# Patient Record
Sex: Female | Born: 1965 | Race: White | Hispanic: No | Marital: Married | Smoking: Former smoker
Health system: Southern US, Community
[De-identification: ages and names within clinical notes are randomized; demographics above are authoritative.]

## PROBLEM LIST (undated history)

## (undated) DIAGNOSIS — F419 Anxiety disorder, unspecified: Secondary | ICD-10-CM

## (undated) DIAGNOSIS — R7303 Prediabetes: Secondary | ICD-10-CM

## (undated) DIAGNOSIS — I10 Essential (primary) hypertension: Secondary | ICD-10-CM

## (undated) DIAGNOSIS — K5900 Constipation, unspecified: Secondary | ICD-10-CM

## (undated) DIAGNOSIS — K219 Gastro-esophageal reflux disease without esophagitis: Secondary | ICD-10-CM

## (undated) HISTORY — DX: Anxiety disorder, unspecified: F41.9

## (undated) HISTORY — DX: Constipation, unspecified: K59.00

---

## 1989-11-22 HISTORY — PX: FACIAL FRACTURE SURGERY: SHX1570

## 2018-04-25 DIAGNOSIS — F32A Depression, unspecified: Secondary | ICD-10-CM | POA: Insufficient documentation

## 2018-04-25 DIAGNOSIS — F329 Major depressive disorder, single episode, unspecified: Secondary | ICD-10-CM | POA: Insufficient documentation

## 2018-04-25 DIAGNOSIS — F331 Major depressive disorder, recurrent, moderate: Secondary | ICD-10-CM | POA: Insufficient documentation

## 2018-07-03 DIAGNOSIS — E559 Vitamin D deficiency, unspecified: Secondary | ICD-10-CM | POA: Insufficient documentation

## 2020-11-27 ENCOUNTER — Ambulatory Visit (INDEPENDENT_AMBULATORY_CARE_PROVIDER_SITE_OTHER): Payer: 59 | Admitting: Internal Medicine

## 2020-11-27 ENCOUNTER — Encounter: Payer: Self-pay | Admitting: Internal Medicine

## 2020-11-27 ENCOUNTER — Other Ambulatory Visit: Payer: Self-pay

## 2020-11-27 VITALS — BP 160/90 | HR 106 | Temp 98.8°F | Resp 18 | Ht 61.0 in | Wt 142.8 lb

## 2020-11-27 DIAGNOSIS — Z7689 Persons encountering health services in other specified circumstances: Secondary | ICD-10-CM

## 2020-11-27 DIAGNOSIS — F419 Anxiety disorder, unspecified: Secondary | ICD-10-CM | POA: Insufficient documentation

## 2020-11-27 DIAGNOSIS — K5909 Other constipation: Secondary | ICD-10-CM | POA: Diagnosis not present

## 2020-11-27 DIAGNOSIS — R195 Other fecal abnormalities: Secondary | ICD-10-CM | POA: Diagnosis not present

## 2020-11-27 DIAGNOSIS — I10 Essential (primary) hypertension: Secondary | ICD-10-CM

## 2020-11-27 DIAGNOSIS — E785 Hyperlipidemia, unspecified: Secondary | ICD-10-CM | POA: Insufficient documentation

## 2020-11-27 DIAGNOSIS — G609 Hereditary and idiopathic neuropathy, unspecified: Secondary | ICD-10-CM

## 2020-11-27 DIAGNOSIS — G629 Polyneuropathy, unspecified: Secondary | ICD-10-CM | POA: Insufficient documentation

## 2020-11-27 MED ORDER — BUPROPION HCL ER (SR) 200 MG PO TB12: 200.0000 mg | ORAL_TABLET | Freq: Two times a day (BID) | ORAL | 5 refills | Status: DC

## 2020-11-27 MED ORDER — DOCUSATE SODIUM 100 MG PO CAPS: 100.0000 mg | ORAL_CAPSULE | Freq: Two times a day (BID) | ORAL | 0 refills | Status: DC

## 2020-11-27 MED ORDER — GABAPENTIN 100 MG PO CAPS: 100.0000 mg | ORAL_CAPSULE | Freq: Two times a day (BID) | ORAL | 3 refills | Status: DC

## 2020-11-27 MED ORDER — LOSARTAN POTASSIUM 50 MG PO TABS: 50.0000 mg | ORAL_TABLET | Freq: Every day | ORAL | 0 refills | Status: DC

## 2020-11-27 NOTE — Patient Instructions (Addendum)
Please continue to take Wellbutrin. Please stop taking Diclofenac and take Tylenol as needed.  Please start taking Gabapentin as prescribed for neuropathic pain and numbness.  Please start taking Losartan as prescribed for hypertension.  Please start taking Vitamin B12 supplements, 1000 mcg once daily.  Thank you for choosing [facility]! We consider it our privilege to take care of you!

## 2020-11-28 ENCOUNTER — Encounter: Payer: Self-pay | Admitting: Internal Medicine

## 2020-11-28 DIAGNOSIS — I10 Essential (primary) hypertension: Secondary | ICD-10-CM | POA: Insufficient documentation

## 2020-11-28 LAB — TSH: TSH: 1.48 u[IU]/mL (ref 0.450–4.500)

## 2020-11-28 LAB — CMP14+EGFR
ALT: 25 IU/L (ref 0–32)
AST: 20 IU/L (ref 0–40)
Albumin/Globulin Ratio: 1.5 (ref 1.2–2.2)
Albumin: 4.5 g/dL (ref 3.8–4.9)
Alkaline Phosphatase: 102 IU/L (ref 44–121)
BUN/Creatinine Ratio: 16 (ref 9–23)
BUN: 14 mg/dL (ref 6–24)
Bilirubin Total: <0.2 mg/dL (ref 0.0–1.2)
CO2: 19 mmol/L — ABNORMAL LOW (ref 20–29)
Calcium: 9.9 mg/dL (ref 8.7–10.2)
Chloride: 102 mmol/L (ref 96–106)
Creatinine, Ser: 0.89 mg/dL (ref 0.57–1.00)
GFR calc Af Amer: 85 mL/min/{1.73_m2} (ref >59)
GFR calc non Af Amer: 74 mL/min/{1.73_m2} (ref >59)
Globulin, Total: 3 g/dL (ref 1.5–4.5)
Glucose: 99 mg/dL (ref 65–99)
Potassium: 4.6 mmol/L (ref 3.5–5.2)
Sodium: 140 mmol/L (ref 134–144)
Total Protein: 7.5 g/dL (ref 6.0–8.5)

## 2020-11-28 LAB — CBC WITH DIFFERENTIAL/PLATELET
Basophils Absolute: 0.1 10*3/uL (ref 0.0–0.2)
Basos: 1 %
EOS (ABSOLUTE): 0.2 10*3/uL (ref 0.0–0.4)
Eos: 1 %
Hematocrit: 39.2 % (ref 34.0–46.6)
Hemoglobin: 13 g/dL (ref 11.1–15.9)
Immature Grans (Abs): 0 10*3/uL (ref 0.0–0.1)
Immature Granulocytes: 0 %
Lymphocytes Absolute: 3.2 10*3/uL — ABNORMAL HIGH (ref 0.7–3.1)
Lymphs: 23 %
MCH: 30.5 pg (ref 26.6–33.0)
MCHC: 33.2 g/dL (ref 31.5–35.7)
MCV: 92 fL (ref 79–97)
Monocytes Absolute: 0.9 10*3/uL (ref 0.1–0.9)
Monocytes: 6 %
Neutrophils Absolute: 9.6 10*3/uL — ABNORMAL HIGH (ref 1.4–7.0)
Neutrophils: 69 %
Platelets: 345 10*3/uL (ref 150–450)
RBC: 4.26 x10E6/uL (ref 3.77–5.28)
RDW: 12 % (ref 11.7–15.4)
WBC: 13.9 10*3/uL — ABNORMAL HIGH (ref 3.4–10.8)

## 2020-11-28 LAB — LIPID PANEL
Chol/HDL Ratio: 3.4 ratio (ref 0.0–4.4)
Cholesterol, Total: 198 mg/dL (ref 100–199)
HDL: 59 mg/dL (ref >39)
LDL Chol Calc (NIH): 112 mg/dL — ABNORMAL HIGH (ref 0–99)
Triglycerides: 154 mg/dL — ABNORMAL HIGH (ref 0–149)
VLDL Cholesterol Cal: 27 mg/dL (ref 5–40)

## 2020-11-28 LAB — HEMOGLOBIN A1C
Est. average glucose Bld gHb Est-mCnc: 120 mg/dL
Hgb A1c MFr Bld: 5.8 % — ABNORMAL HIGH (ref 4.8–5.6)

## 2020-11-28 LAB — VITAMIN D 25 HYDROXY (VIT D DEFICIENCY, FRACTURES): Vit D, 25-Hydroxy: 40.9 ng/mL (ref 30.0–100.0)

## 2020-11-28 NOTE — Assessment & Plan Note (Signed)
Started Colace Advised to increase water intake Referred to GI for colonoscopy as she complains of intermittent hematochezia and mucus in stool

## 2020-11-28 NOTE — Assessment & Plan Note (Signed)
On Wellbutrin 200 mg twice daily Has occasional anxiety episodes, seems somewhat related to her GI symptoms. If persistent anxiety episodes, plan to add Vistaril as needed.

## 2020-11-28 NOTE — Assessment & Plan Note (Signed)
Care established Previous chart reviewed History and medications reviewed with the patient 

## 2020-11-28 NOTE — Assessment & Plan Note (Signed)
Pins-and-needles sensation in bilateral UE and LE Started gabapentin Advised to take vitamin B12 supplements.

## 2020-11-28 NOTE — Assessment & Plan Note (Signed)
BP Readings from Last 1 Encounters:  11/27/20 (!) 160/90   New-onset Started losartan 50 mg daily Counseled for compliance with the medications Advised DASH diet and moderate exercise/walking, at least 150 mins/week

## 2020-11-28 NOTE — Progress Notes (Addendum)
New Patient Office Visit  Subjective:  Patient ID: Alexandra Bright, female    DOB: 31-Jul-1966  Age: 55 y.o. MRN: [id number]  CC:  Chief Complaint  Patient presents with  . New Patient (Initial Visit)    New patient pt has stomach problems she is constipated all the time has a pain under butt cheek is she bends over it pulls hard sometimes has read mucous when she wipes is unsure if this is coming from being constipated also has fingernail hemorrhaging she is tired and fatigues and she gets stiff when sitting for a time period her hands and feet are going numb her heels are killing her when she stands up     HPI Alexandra Bright is a 55 year old female with PMH of anxiety who presents for establishing care.  Patient states that she has chronic constipation and uses magnesium containing laxatives at times. She has also noticed blood and mucus in the stool at times. She denies melena, hematemesis, abdominal pain, nausea or vomiting. Denies recent weight loss. She has not had any colonoscopy yet.  She also c/o numbness sensation intermittently in b/l UE and LE. She denies any wrist pain, ankle pain or back pain association. She denies any recent injury. Denies any focal weakness.  Her BP was 160/90 on repeat check in the office today. She has had high BP during previous medical evaluations. Denies any dizziness, chest pain, dyspnea or palpitations.  Last Mammography in 02/2020.  Last PAP smear in 02/2020.  Patient has had 2 doses of COVID vaccine.  Past Medical History:  Diagnosis Date  . Anxiety   . Constipation     History reviewed. No pertinent surgical history.  History reviewed. No pertinent family history.  Social History   Socioeconomic History  . Marital status: Married    Spouse name: Not on file  . Number of children: Not on file  . Years of education: Not on file  . Highest education level: Not on file  Occupational History  . Not on file  Tobacco Use  . Smoking status:  Former Research scientist (life sciences)  . Smokeless tobacco: Never Used  Substance and Sexual Activity  . Alcohol use: Never  . Drug use: Never  . Sexual activity: Not on file  Other Topics Concern  . Not on file  Social History Narrative  . Not on file   Social Determinants of Health   Financial Resource Strain: Not on file  Food Insecurity: Not on file  Transportation Needs: Not on file  Physical Activity: Not on file  Stress: Not on file  Social Connections: Not on file  Intimate Partner Violence: Not on file    ROS Review of Systems  Constitutional: Negative for chills and fever.  HENT: Negative for congestion, sinus pressure, sinus pain and sore throat.   Eyes: Negative for pain and discharge.  Respiratory: Negative for cough and shortness of breath.   Cardiovascular: Negative for chest pain and palpitations.  Gastrointestinal: Positive for blood in stool and constipation. Negative for abdominal pain, diarrhea, nausea and vomiting.  Endocrine: Negative for polydipsia and polyuria.  Genitourinary: Negative for dysuria and hematuria.  Musculoskeletal: Negative for neck pain and neck stiffness.  Skin: Negative for rash.  Neurological: Positive for numbness. Negative for dizziness, seizures, syncope and weakness.  Psychiatric/Behavioral: Negative for agitation and behavioral problems.     Objective:   Today's Vitals: BP (!) 160/90 (BP Location: Right Arm, Patient Position: Sitting, Cuff Size: Normal)   Pulse (!) 106  Temp 98.8 F (37.1 C) (Oral)   Resp 18   Ht _0  (1.549 m)   Wt 142 lb 12.8 oz (64.8 kg)   SpO2 96%   BMI 26.98 kg/m   Physical Exam Vitals reviewed.  Constitutional:      General: She is not in acute distress.    Appearance: She is not diaphoretic.  HENT:     Head: Normocephalic and atraumatic.     Nose: Nose normal.     Mouth/Throat:     Mouth: Mucous membranes are moist.  Eyes:     General: No scleral icterus.    Extraocular Movements: Extraocular movements  intact.     Pupils: Pupils are equal, round, and reactive to light.  Cardiovascular:     Rate and Rhythm: Normal rate and regular rhythm.     Pulses: Normal pulses.     Heart sounds: Normal heart sounds. No murmur heard.   Pulmonary:     Breath sounds: Normal breath sounds. No wheezing or rales.  Abdominal:     Palpations: Abdomen is soft.     Tenderness: There is no abdominal tenderness. There is no guarding or rebound.  Musculoskeletal:     Cervical back: Neck supple. No tenderness.     Right lower leg: No edema.     Left lower leg: No edema.  Skin:    General: Skin is warm.     Findings: No rash.  Neurological:     General: No focal deficit present.     Mental Status: She is alert and oriented to person, place, and time.     Sensory: No sensory deficit.     Motor: No weakness.  Psychiatric:        Mood and Affect: Mood normal.        Behavior: Behavior normal.     Assessment & Plan:   Problem List Items Addressed This Visit      Encounter to establish care - Primary   Care established Previous chart reviewed History and medications reviewed with the patient     Relevant Orders  CBC with Differential (Completed)  CMP14+EGFR (Completed)  HgB A1c (Completed)  Lipid Profile (Completed)  TSH (Completed)  Vitamin D (25 hydroxy) (Completed)    Cardiovascular and Mediastinum   Hypertension    BP Readings from Last 1 Encounters:  11/27/20 (!) 160/90   New-onset Started losartan 50 mg daily Counseled for compliance with the medications Advised DASH diet and moderate exercise/walking, at least 150 mins/week       Relevant Medications   losartan (COZAAR) 50 MG tablet     Digestive   Chronic constipation    Started Colace Advised to increase water intake Referred to GI for colonoscopy as she complains of intermittent hematochezia and mucus in stool      Relevant Medications   docusate sodium (COLACE) 100 MG capsule   Other Relevant Orders   Ambulatory  referral to Gastroenterology     Nervous and Auditory   Idiopathic peripheral neuropathy    Pins-and-needles sensation in bilateral UE and LE Started gabapentin Advised to take vitamin B12 supplements.      Relevant Medications   buPROPion (WELLBUTRIN SR) 200 MG 12 hr tablet   gabapentin (NEURONTIN) 100 MG capsule     Other      Anxiety    On Wellbutrin 200 mg twice daily Has occasional anxiety episodes, seems somewhat related to her GI symptoms. If persistent anxiety episodes, plan to add Vistaril as  needed.      Relevant Medications   buPROPion (WELLBUTRIN SR) 200 MG 12 hr tablet   Mucus in stool    Intermittent episodes along with hematochezia Referred to GI for colonoscopy to rule out IBD      Relevant Orders   Ambulatory referral to Gastroenterology      Outpatient Encounter Medications as of 11/27/2020  Medication Sig  . docusate sodium (COLACE) 100 MG capsule Take 1 capsule (100 mg total) by mouth 2 (two) times daily.  Marland Kitchen gabapentin (NEURONTIN) 100 MG capsule Take 1 capsule (100 mg total) by mouth 2 (two) times daily.  Marland Kitchen losartan (COZAAR) 50 MG tablet Take 1 tablet (50 mg total) by mouth daily.  . [DISCONTINUED] buPROPion (WELLBUTRIN SR) 200 MG 12 hr tablet Take 1 tablet by mouth 2 (two) times daily.  . [DISCONTINUED] diclofenac (VOLTAREN) 75 MG EC tablet TAKE 1 TABLET BY MOUTH TWICE DAILY AS NEEDED WITH  FOOD  . buPROPion (WELLBUTRIN SR) 200 MG 12 hr tablet Take 1 tablet (200 mg total) by mouth 2 (two) times daily.   No facility-administered encounter medications on file as of 11/27/2020.    Follow-up: Return in about 3 months (around 02/25/2021).   Lindell Spar, MD

## 2020-11-28 NOTE — Assessment & Plan Note (Signed)
Intermittent episodes along with hematochezia Referred to GI for colonoscopy to rule out IBD

## 2020-12-01 ENCOUNTER — Encounter (INDEPENDENT_AMBULATORY_CARE_PROVIDER_SITE_OTHER): Payer: Self-pay | Admitting: *Deleted

## 2020-12-16 ENCOUNTER — Telehealth: Payer: Self-pay

## 2020-12-16 NOTE — Telephone Encounter (Signed)
Patient calling wanting results of lab work she had done [phone]

## 2020-12-16 NOTE — Telephone Encounter (Signed)
Her HbA1C is 5.8, which is in prediabetes range. Her cholesterol is also borderline high. She needs to follow low carbohydrate and low cholesterol diet for now. Will recheck levels later in the next visit.

## 2020-12-16 NOTE — Telephone Encounter (Signed)
Please advise 

## 2020-12-17 NOTE — Telephone Encounter (Signed)
Pt notified of lab results with verbal understanding also given information on low carb low cholesterol diet with verbal understanding

## 2021-01-20 DIAGNOSIS — C189 Malignant neoplasm of colon, unspecified: Secondary | ICD-10-CM

## 2021-01-20 HISTORY — DX: Malignant neoplasm of colon, unspecified: C18.9

## 2021-02-05 ENCOUNTER — Ambulatory Visit (INDEPENDENT_AMBULATORY_CARE_PROVIDER_SITE_OTHER): Payer: 59 | Admitting: Gastroenterology

## 2021-02-05 ENCOUNTER — Telehealth (INDEPENDENT_AMBULATORY_CARE_PROVIDER_SITE_OTHER): Payer: Self-pay

## 2021-02-05 ENCOUNTER — Other Ambulatory Visit (INDEPENDENT_AMBULATORY_CARE_PROVIDER_SITE_OTHER): Payer: Self-pay

## 2021-02-05 ENCOUNTER — Encounter (INDEPENDENT_AMBULATORY_CARE_PROVIDER_SITE_OTHER): Payer: Self-pay | Admitting: Gastroenterology

## 2021-02-05 ENCOUNTER — Other Ambulatory Visit: Payer: Self-pay

## 2021-02-05 ENCOUNTER — Encounter (INDEPENDENT_AMBULATORY_CARE_PROVIDER_SITE_OTHER): Payer: Self-pay

## 2021-02-05 DIAGNOSIS — R194 Change in bowel habit: Secondary | ICD-10-CM | POA: Diagnosis not present

## 2021-02-05 DIAGNOSIS — R140 Abdominal distension (gaseous): Secondary | ICD-10-CM

## 2021-02-05 DIAGNOSIS — K589 Irritable bowel syndrome without diarrhea: Secondary | ICD-10-CM | POA: Insufficient documentation

## 2021-02-05 DIAGNOSIS — K625 Hemorrhage of anus and rectum: Secondary | ICD-10-CM | POA: Insufficient documentation

## 2021-02-05 DIAGNOSIS — Z1211 Encounter for screening for malignant neoplasm of colon: Secondary | ICD-10-CM

## 2021-02-05 DIAGNOSIS — K582 Mixed irritable bowel syndrome: Secondary | ICD-10-CM | POA: Diagnosis not present

## 2021-02-05 MED ORDER — PEG 3350-KCL-NA BICARB-NACL 420 G PO SOLR: 4000.0000 mL | ORAL | 0 refills | Status: DC

## 2021-02-05 NOTE — H&P (View-Only) (Signed)
Alexandra Bright, M.D. Gastroenterology & Hepatology [facility] [address] [place], [place] [address] Primary Care Physician: Lindell Spar, MD [address] [place] [address]  Referring MD: PCP  Chief Complaint:  change in her bowel movements and blood in her stool  History of Present Illness: Alexandra Bright is a 55 y.o. female with PMH HTN, prediabetes and anxiety, who presents for evaluation of change in her bowel movements and blood in her stool.  Patient reports that she had a chronic history of constipation for multiple years. She used to have 2-3 BM per week. States she had a small bowel movement usually when she was able to move her bowels. Patient reports that after she received her second COVID shot she has had worsening of her symptoms as her BMs had a pellet size. She reports that when she was constipated she had significant abdominal pain and distention. She described having cramping in her lower abdomen. States the cramping improves with a BM. She states that since December  2021 she had new onset of multiple bowel movements per day, on average 4 to 12 BMs usually soft in consistency and a normal size. She states she was concerned as before she developed diarrhea she noticed that every week she had one BM with mucus and with some fresh blood. She also reports that she had to have a BM during the middle of the night twice which woke her up in the middle of the night. Overall feels the symptoms improve after having a BM. Also has noticed that her flatulence are very smelly.  She is not following any type of diet at the moment.  Has not tried any laxative or antidiarrheals in the past.  No recent travel in the last 6 months.  The patient denies having any nausea, vomiting, fever, chills, hematochezia, melena, hematemesis,  jaundice, pruritus. Gained 4 lb recently.  Last VEH:MCNOB Last Colonoscopy:never  FHx: neg for any  gastrointestinal/liver disease, cousins had cancer but does not know which type, aunt had lung cancer Social: neg smoking, alcohol or illicit drug use Surgical: no abdominal surgeries  Past Medical History: Past Medical History:  Diagnosis Date  . Anxiety   . Constipation     Past Surgical History:History reviewed. No pertinent surgical history.  Family History:History reviewed. No pertinent family history.  Social History: Social History   Tobacco Use  Smoking Status Former Smoker  Smokeless Tobacco Never Used   Social History   Substance and Sexual Activity  Alcohol Use Yes   Comment: occasionally   Social History   Substance and Sexual Activity  Drug Use Never    Allergies: Not on File  Medications: Current Outpatient Medications  Medication Sig Dispense Refill  . buPROPion (WELLBUTRIN SR) 200 MG 12 hr tablet Take 1 tablet (200 mg total) by mouth 2 (two) times daily. 60 tablet 5  . docusate sodium (COLACE) 100 MG capsule Take 1 capsule (100 mg total) by mouth 2 (two) times daily. 30 capsule 0  . gabapentin (NEURONTIN) 100 MG capsule Take 1 capsule (100 mg total) by mouth 2 (two) times daily. 60 capsule 3  . losartan (COZAAR) 50 MG tablet Take 1 tablet (50 mg total) by mouth daily. 90 tablet 0  . Multiple Vitamins-Minerals (MULTIVITAMIN WITH MINERALS) tablet Take 1 tablet by mouth daily.    Marland Kitchen OVER THE COUNTER MEDICATION Vit D with Calcium 20/600 mg daily.     No current facility-administered medications for  this visit.    Review of Systems: GENERAL: negative for malaise, night sweats HEENT: No changes in hearing or vision, no nose bleeds or other nasal problems. NECK: Negative for lumps, goiter, pain and significant neck swelling RESPIRATORY: Negative for cough, wheezing CARDIOVASCULAR: Negative for chest pain, leg swelling, palpitations, orthopnea GI: SEE HPI MUSCULOSKELETAL: Negative for joint pain or swelling, back pain, and muscle pain. SKIN: Negative  for lesions, rash PSYCH: Negative for sleep disturbance, mood disorder and recent psychosocial stressors. HEMATOLOGY Negative for prolonged bleeding, bruising easily, and swollen nodes. ENDOCRINE: Negative for cold or heat intolerance, polyuria, polydipsia and goiter. NEURO: negative for tremor, gait imbalance, syncope and seizures. The remainder of the review of systems is noncontributory.   Physical Exam: BP (!) 145/88 (BP Location: Left Arm, Patient Position: Sitting, Cuff Size: Large)   Pulse 100   Temp 98.8 F (37.1 C) (Oral)   Ht 5\' 1"  (1.549 m)   Wt 142 lb (64.4 kg)   BMI 26.83 kg/m  GENERAL: The patient is AO x3, in no acute distress. HEENT: Head is normocephalic and atraumatic. EOMI are intact. Mouth is well hydrated and without lesions. NECK: Supple. No masses LUNGS: Clear to auscultation. No presence of rhonchi/wheezing/rales. Adequate chest expansion HEART: RRR, normal s1 and s2. ABDOMEN: Soft, nontender, no guarding, no peritoneal signs, and nondistended. BS +. No masses. EXTREMITIES: Without any cyanosis, clubbing, rash, lesions or edema. NEUROLOGIC: AOx3, no focal motor deficit. SKIN: no jaundice, no rashes   Imaging/Labs: as above  I personally reviewed and interpreted the available labs, imaging and endoscopic files.  Impression and Plan: Alexandra Bright is a 55 y.o. female with PMH HTN, prediabetes and anxiety, who presents for evaluation of change in her bowel movements and blood in her stool.  She presented a chronic history of constipation which has recently switched to chronic diarrhea on a daily basis.  Also presented some abdominal pain that actually improves with bowel movements.  Even though her symptoms seem to suggest irritable bowel syndrome mixed type given the chronicity of her symptoms, she has recently presented rectal bleeding which will need to be further explored as she has never had a colonoscopy in the past and she is above age 67.  I discussed this  with the patient who agreed to proceed with a colonoscopy.  We will also check celiac serologies and ova and parasite at this time to evaluate her chronic diarrhea.  In the meantime, she can implement the use of a low FODMAP diet and take IBgard to improve her symptoms of bloating abdominal pain.  She understood and agreed.  - Schedule colonoscopy - Check celiac serologies and ova and parasite in stool - Explained presumed etiology of IBS symptoms. Patient was counseled about the benefit of implementing a low FODMAP to improve symptoms and recurrent episodes. A dietary list was provided to the patient. Also, the patient was counseled about the benefit of avoiding stressing situations and potential environmental triggers leading to symptomatology. - Start IBGard/peppermint oil 1 tablet every 8-12 hours as needed for bloating and abdominal pain - RTC 3 months  All questions were answered.      Alexandra Peppers, MD Gastroenterology and Hepatology [facility]

## 2021-02-05 NOTE — Progress Notes (Signed)
[name], M.D. Gastroenterology & Hepatology [facility] [address] [place], [place] [address] Primary Care Physician: Lindell Spar, MD [address] [place] [address]  Referring MD: PCP  Chief Complaint:  change in her bowel movements and blood in her stool  History of Present Illness: Alexandra Bright is a 55 y.o. female with PMH HTN, prediabetes and anxiety, who presents for evaluation of change in her bowel movements and blood in her stool.  Patient reports that she had a chronic history of constipation for multiple years. She used to have 2-3 BM per week. States she had a small bowel movement usually when she was able to move her bowels. Patient reports that after she received her second COVID shot she has had worsening of her symptoms as her BMs had a pellet size. She reports that when she was constipated she had significant abdominal pain and distention. She described having cramping in her lower abdomen. States the cramping improves with a BM. She states that since December  2021 she had new onset of multiple bowel movements per day, on average 4 to 12 BMs usually soft in consistency and a normal size. She states she was concerned as before she developed diarrhea she noticed that every week she had one BM with mucus and with some fresh blood. She also reports that she had to have a BM during the middle of the night twice which woke her up in the middle of the night. Overall feels the symptoms improve after having a BM. Also has noticed that her flatulence are very smelly.  She is not following any type of diet at the moment.  Has not tried any laxative or antidiarrheals in the past.  No recent travel in the last 6 months.  The patient denies having any nausea, vomiting, fever, chills, hematochezia, melena, hematemesis,  jaundice, pruritus. Gained 4 lb recently.  Last WFU:XNATF Last Colonoscopy:never  FHx: neg for any  gastrointestinal/liver disease, cousins had cancer but does not know which type, aunt had lung cancer Social: neg smoking, alcohol or illicit drug use Surgical: no abdominal surgeries  Past Medical History: Past Medical History:  Diagnosis Date  . Anxiety   . Constipation     Past Surgical History:History reviewed. No pertinent surgical history.  Family History:History reviewed. No pertinent family history.  Social History: Social History   Tobacco Use  Smoking Status Former Smoker  Smokeless Tobacco Never Used   Social History   Substance and Sexual Activity  Alcohol Use Yes   Comment: occasionally   Social History   Substance and Sexual Activity  Drug Use Never    Allergies: Not on File  Medications: Current Outpatient Medications  Medication Sig Dispense Refill  . buPROPion (WELLBUTRIN SR) 200 MG 12 hr tablet Take 1 tablet (200 mg total) by mouth 2 (two) times daily. 60 tablet 5  . docusate sodium (COLACE) 100 MG capsule Take 1 capsule (100 mg total) by mouth 2 (two) times daily. 30 capsule 0  . gabapentin (NEURONTIN) 100 MG capsule Take 1 capsule (100 mg total) by mouth 2 (two) times daily. 60 capsule 3  . losartan (COZAAR) 50 MG tablet Take 1 tablet (50 mg total) by mouth daily. 90 tablet 0  . Multiple Vitamins-Minerals (MULTIVITAMIN WITH MINERALS) tablet Take 1 tablet by mouth daily.    Marland Kitchen OVER THE COUNTER MEDICATION Vit D with Calcium 20/600 mg daily.     No current facility-administered medications for  this visit.    Review of Systems: GENERAL: negative for malaise, night sweats HEENT: No changes in hearing or vision, no nose bleeds or other nasal problems. NECK: Negative for lumps, goiter, pain and significant neck swelling RESPIRATORY: Negative for cough, wheezing CARDIOVASCULAR: Negative for chest pain, leg swelling, palpitations, orthopnea GI: SEE HPI MUSCULOSKELETAL: Negative for joint pain or swelling, back pain, and muscle pain. SKIN: Negative  for lesions, rash PSYCH: Negative for sleep disturbance, mood disorder and recent psychosocial stressors. HEMATOLOGY Negative for prolonged bleeding, bruising easily, and swollen nodes. ENDOCRINE: Negative for cold or heat intolerance, polyuria, polydipsia and goiter. NEURO: negative for tremor, gait imbalance, syncope and seizures. The remainder of the review of systems is noncontributory.   Physical Exam: BP (!) 145/88 (BP Location: Left Arm, Patient Position: Sitting, Cuff Size: Large)   Pulse 100   Temp 98.8 F (37.1 C) (Oral)   Ht 5\' 1"  (1.549 m)   Wt 142 lb (64.4 kg)   BMI 26.83 kg/m  GENERAL: The patient is AO x3, in no acute distress. HEENT: Head is normocephalic and atraumatic. EOMI are intact. Mouth is well hydrated and without lesions. NECK: Supple. No masses LUNGS: Clear to auscultation. No presence of rhonchi/wheezing/rales. Adequate chest expansion HEART: RRR, normal s1 and s2. ABDOMEN: Soft, nontender, no guarding, no peritoneal signs, and nondistended. BS +. No masses. EXTREMITIES: Without any cyanosis, clubbing, rash, lesions or edema. NEUROLOGIC: AOx3, no focal motor deficit. SKIN: no jaundice, no rashes   Imaging/Labs: as above  I personally reviewed and interpreted the available labs, imaging and endoscopic files.  Impression and Plan: Alexandra Bright is a 55 y.o. female with PMH HTN, prediabetes and anxiety, who presents for evaluation of change in her bowel movements and blood in her stool.  She presented a chronic history of constipation which has recently switched to chronic diarrhea on a daily basis.  Also presented some abdominal pain that actually improves with bowel movements.  Even though her symptoms seem to suggest irritable bowel syndrome mixed type given the chronicity of her symptoms, she has recently presented rectal bleeding which will need to be further explored as she has never had a colonoscopy in the past and she is above age 80.  I discussed this  with the patient who agreed to proceed with a colonoscopy.  We will also check celiac serologies and ova and parasite at this time to evaluate her chronic diarrhea.  In the meantime, she can implement the use of a low FODMAP diet and take IBgard to improve her symptoms of bloating abdominal pain.  She understood and agreed.  - Schedule colonoscopy - Check celiac serologies and ova and parasite in stool - Explained presumed etiology of IBS symptoms. Patient was counseled about the benefit of implementing a low FODMAP to improve symptoms and recurrent episodes. A dietary list was provided to the patient. Also, the patient was counseled about the benefit of avoiding stressing situations and potential environmental triggers leading to symptomatology. - Start IBGard/peppermint oil 1 tablet every 8-12 hours as needed for bloating and abdominal pain - RTC 3 months  All questions were answered.      [name], MD Gastroenterology and Hepatology [facility]

## 2021-02-05 NOTE — Patient Instructions (Signed)
Schedule colonoscopy Perform blood workup Explained presumed etiology of IBS symptoms. Patient was counseled about the benefit of implementing a low FODMAP to improve symptoms and recurrent episodes. A dietary list was provided to the patient. Also, the patient was counseled about the benefit of avoiding stressing situations and potential environmental triggers leading to symptomatology. Start IBGard/peppermint oil 1 tablet every 8-12 hours as needed for bloating and abdominal pain

## 2021-02-05 NOTE — Telephone Encounter (Signed)
Alexandra Bright, CMA  

## 2021-02-06 LAB — CBC WITH DIFFERENTIAL/PLATELET
Absolute Monocytes: 851 cells/uL (ref 200–950)
Basophils Absolute: 69 cells/uL (ref 0–200)
Basophils Relative: 0.6 %
Eosinophils Absolute: 138 cells/uL (ref 15–500)
Eosinophils Relative: 1.2 %
HCT: 41.9 % (ref 35.0–45.0)
Hemoglobin: 13.7 g/dL (ref 11.7–15.5)
Lymphs Abs: 2841 cells/uL (ref 850–3900)
MCH: 30.4 pg (ref 27.0–33.0)
MCHC: 32.7 g/dL (ref 32.0–36.0)
MCV: 92.9 fL (ref 80.0–100.0)
MPV: 11.2 fL (ref 7.5–12.5)
Monocytes Relative: 7.4 %
Neutro Abs: 7602 cells/uL (ref 1500–7800)
Neutrophils Relative %: 66.1 %
Platelets: 289 10*3/uL (ref 140–400)
RBC: 4.51 10*6/uL (ref 3.80–5.10)
RDW: 13.6 % (ref 11.0–15.0)
Total Lymphocyte: 24.7 %
WBC: 11.5 10*3/uL — ABNORMAL HIGH (ref 3.8–10.8)

## 2021-02-06 LAB — IGA: Immunoglobulin A: 278 mg/dL (ref 47–310)

## 2021-02-06 LAB — TISSUE TRANSGLUTAMINASE, IGA: (tTG) Ab, IgA: <1 U/mL

## 2021-02-12 LAB — OVA AND PARASITE EXAMINATION
CONCENTRATE RESULT:: NONE SEEN
MICRO NUMBER:: 11671840
SPECIMEN QUALITY:: ADEQUATE
TRICHROME RESULT:: NONE SEEN

## 2021-02-16 ENCOUNTER — Other Ambulatory Visit: Payer: Self-pay

## 2021-02-16 ENCOUNTER — Other Ambulatory Visit: Payer: Self-pay | Admitting: *Deleted

## 2021-02-16 ENCOUNTER — Other Ambulatory Visit (HOSPITAL_COMMUNITY)
Admission: RE | Admit: 2021-02-16 | Discharge: 2021-02-16 | Disposition: A | Discharge status: Home / Self Care | Payer: 59 | Source: Ambulatory Visit | Attending: Gastroenterology | Admitting: Gastroenterology

## 2021-02-16 ENCOUNTER — Other Ambulatory Visit: Payer: Self-pay | Admitting: Internal Medicine

## 2021-02-16 ENCOUNTER — Telehealth: Payer: Self-pay

## 2021-02-16 DIAGNOSIS — Z01812 Encounter for preprocedural laboratory examination: Secondary | ICD-10-CM | POA: Diagnosis present

## 2021-02-16 DIAGNOSIS — Z20822 Contact with and (suspected) exposure to covid-19: Secondary | ICD-10-CM | POA: Diagnosis not present

## 2021-02-16 DIAGNOSIS — I10 Essential (primary) hypertension: Secondary | ICD-10-CM

## 2021-02-16 MED ORDER — LOSARTAN POTASSIUM 50 MG PO TABS: 50.0000 mg | ORAL_TABLET | Freq: Every day | ORAL | 0 refills | Status: DC

## 2021-02-16 NOTE — Telephone Encounter (Signed)
Please call in Losartan to [facility] in [place]

## 2021-02-16 NOTE — Telephone Encounter (Signed)
Medication sent to pharmacy  

## 2021-02-17 LAB — SARS CORONAVIRUS 2 (TAT 6-24 HRS): SARS Coronavirus 2: NEGATIVE

## 2021-02-18 ENCOUNTER — Ambulatory Visit (HOSPITAL_COMMUNITY): Payer: 59 | Admitting: Anesthesiology

## 2021-02-18 ENCOUNTER — Other Ambulatory Visit (INDEPENDENT_AMBULATORY_CARE_PROVIDER_SITE_OTHER): Payer: Self-pay | Admitting: Gastroenterology

## 2021-02-18 ENCOUNTER — Ambulatory Visit (HOSPITAL_COMMUNITY)
Admission: RE | Admit: 2021-02-18 | Discharge: 2021-02-18 | Disposition: A | Discharge status: Home / Self Care | Payer: 59 | Attending: Gastroenterology | Admitting: Gastroenterology

## 2021-02-18 ENCOUNTER — Encounter (HOSPITAL_COMMUNITY)
Admission: RE | Disposition: A | Discharge status: Home / Self Care | Payer: Self-pay | Source: Home / Self Care | Attending: Gastroenterology

## 2021-02-18 ENCOUNTER — Encounter (HOSPITAL_COMMUNITY): Payer: Self-pay | Admitting: Gastroenterology

## 2021-02-18 ENCOUNTER — Other Ambulatory Visit: Payer: Self-pay

## 2021-02-18 ENCOUNTER — Telehealth (INDEPENDENT_AMBULATORY_CARE_PROVIDER_SITE_OTHER): Payer: Self-pay | Admitting: Gastroenterology

## 2021-02-18 ENCOUNTER — Telehealth (INDEPENDENT_AMBULATORY_CARE_PROVIDER_SITE_OTHER): Payer: Self-pay | Admitting: *Deleted

## 2021-02-18 DIAGNOSIS — R7303 Prediabetes: Secondary | ICD-10-CM | POA: Insufficient documentation

## 2021-02-18 DIAGNOSIS — C20 Malignant neoplasm of rectum: Secondary | ICD-10-CM | POA: Insufficient documentation

## 2021-02-18 DIAGNOSIS — Z79899 Other long term (current) drug therapy: Secondary | ICD-10-CM | POA: Diagnosis not present

## 2021-02-18 DIAGNOSIS — K5669 Other partial intestinal obstruction: Secondary | ICD-10-CM

## 2021-02-18 DIAGNOSIS — I10 Essential (primary) hypertension: Secondary | ICD-10-CM | POA: Diagnosis not present

## 2021-02-18 DIAGNOSIS — D125 Benign neoplasm of sigmoid colon: Secondary | ICD-10-CM | POA: Diagnosis not present

## 2021-02-18 DIAGNOSIS — R197 Diarrhea, unspecified: Secondary | ICD-10-CM | POA: Diagnosis not present

## 2021-02-18 DIAGNOSIS — K625 Hemorrhage of anus and rectum: Secondary | ICD-10-CM | POA: Insufficient documentation

## 2021-02-18 DIAGNOSIS — C19 Malignant neoplasm of rectosigmoid junction: Secondary | ICD-10-CM

## 2021-02-18 DIAGNOSIS — Z801 Family history of malignant neoplasm of trachea, bronchus and lung: Secondary | ICD-10-CM | POA: Diagnosis not present

## 2021-02-18 DIAGNOSIS — D490 Neoplasm of unspecified behavior of digestive system: Secondary | ICD-10-CM | POA: Diagnosis not present

## 2021-02-18 DIAGNOSIS — C187 Malignant neoplasm of sigmoid colon: Secondary | ICD-10-CM

## 2021-02-18 DIAGNOSIS — Z87891 Personal history of nicotine dependence: Secondary | ICD-10-CM | POA: Insufficient documentation

## 2021-02-18 DIAGNOSIS — F419 Anxiety disorder, unspecified: Secondary | ICD-10-CM | POA: Diagnosis not present

## 2021-02-18 DIAGNOSIS — K5730 Diverticulosis of large intestine without perforation or abscess without bleeding: Secondary | ICD-10-CM | POA: Diagnosis not present

## 2021-02-18 HISTORY — PX: POLYPECTOMY: SHX149

## 2021-02-18 HISTORY — PX: FLEXIBLE SIGMOIDOSCOPY: SHX5431

## 2021-02-18 HISTORY — DX: Essential (primary) hypertension: I10

## 2021-02-18 HISTORY — PX: BIOPSY: SHX5522

## 2021-02-18 HISTORY — PX: SUBMUCOSAL TATTOO INJECTION: SHX6856

## 2021-02-18 SURGERY — BIOPSY
Anesthesia: General

## 2021-02-18 MED ORDER — PROPOFOL 10 MG/ML IV BOLUS
INTRAVENOUS | Status: DC | PRN
  Administered 2021-02-18: 50 mg via INTRAVENOUS

## 2021-02-18 MED ORDER — PROPOFOL 500 MG/50ML IV EMUL
INTRAVENOUS | Status: DC | PRN
  Administered 2021-02-18: 150 ug/kg/min via INTRAVENOUS

## 2021-02-18 MED ORDER — SPOT INK MARKER SYRINGE KIT
PACK | SUBMUCOSAL | Status: AC
  Filled 2021-02-18: qty 5

## 2021-02-18 MED ORDER — KETAMINE HCL 50 MG/5ML IJ SOSY
PREFILLED_SYRINGE | INTRAMUSCULAR | Status: AC
  Filled 2021-02-18: qty 5

## 2021-02-18 MED ORDER — DICYCLOMINE HCL 10 MG PO CAPS: 10.0000 mg | ORAL_CAPSULE | Freq: Two times a day (BID) | ORAL | 5 refills | Status: DC | PRN

## 2021-02-18 MED ORDER — SPOT INK MARKER SYRINGE KIT
PACK | SUBMUCOSAL | Status: DC | PRN
  Administered 2021-02-18: 3.2 mL via SUBMUCOSAL

## 2021-02-18 MED ORDER — LIDOCAINE HCL (CARDIAC) PF 100 MG/5ML IV SOSY
PREFILLED_SYRINGE | INTRAVENOUS | Status: DC | PRN
  Administered 2021-02-18: 50 mg via INTRATRACHEAL

## 2021-02-18 MED ORDER — LACTATED RINGERS IV SOLN: INTRAVENOUS | Status: DC

## 2021-02-18 MED ORDER — STERILE WATER FOR IRRIGATION IR SOLN
Status: DC | PRN
  Administered 2021-02-18: 200 mL

## 2021-02-18 NOTE — Transfer of Care (Signed)
Immediate Anesthesia Transfer of Care Note  Patient: Alexandra Bright  Procedure(s) Performed: BIOPSY POLYPECTOMY INTESTINAL FLEXIBLE SIGMOIDOSCOPY SUBMUCOSAL TATTOO INJECTION  Patient Location: PACU  Anesthesia Type:General  Level of Consciousness: awake  Airway & Oxygen Therapy: Patient Spontanous Breathing  Post-op Assessment: Report given to RN and Post -op Vital signs reviewed and stable  Post vital signs: Reviewed and stable  Last Vitals:  Vitals Value Taken Time  BP    Temp    Pulse    Resp    SpO2      Last Pain:  Vitals:   02/18/21 0933  TempSrc:   PainSc: 0-No pain      Patients Stated Pain Goal: 5 (59/27/63 9432)  Complications: No complications documented.

## 2021-02-18 NOTE — Anesthesia Preprocedure Evaluation (Signed)
Anesthesia Evaluation  Patient identified by MRN, date of birth, ID band Patient awake    Reviewed: Allergy & Precautions, NPO status , Patient's Chart, lab work & pertinent test results  Airway Mallampati: II  TM Distance: >3 FB Neck ROM: Full    Dental  (+) Dental Advisory Given, Caps   Pulmonary former smoker,    Pulmonary exam normal breath sounds clear to auscultation       Cardiovascular Exercise Tolerance: Good hypertension, Pt. on medications Normal cardiovascular exam Rhythm:Regular Rate:Normal     Neuro/Psych PSYCHIATRIC DISORDERS Anxiety Depression  Neuromuscular disease (neuropathy)    GI/Hepatic negative GI ROS, Neg liver ROS,   Endo/Other  negative endocrine ROS  Renal/GU negative Renal ROS     Musculoskeletal negative musculoskeletal ROS (+)   Abdominal   Peds  Hematology negative hematology ROS (+)   Anesthesia Other Findings   Reproductive/Obstetrics negative OB ROS                             Anesthesia Physical Anesthesia Plan  ASA: II  Anesthesia Plan: General   Post-op Pain Management:    Induction: Intravenous  PONV Risk Score and Plan: Propofol infusion  Airway Management Planned: Nasal Cannula and Natural Airway  Additional Equipment:   Intra-op Plan:   Post-operative Plan:   Informed Consent: I have reviewed the patients History and Physical, chart, labs and discussed the procedure including the risks, benefits and alternatives for the proposed anesthesia with the patient or authorized representative who has indicated his/her understanding and acceptance.     Dental advisory given  Plan Discussed with: CRNA and Surgeon  Anesthesia Plan Comments:         Anesthesia Quick Evaluation

## 2021-02-18 NOTE — Anesthesia Postprocedure Evaluation (Signed)
Anesthesia Post Note  Patient: Alexandra Bright  Procedure(s) Performed: BIOPSY POLYPECTOMY INTESTINAL FLEXIBLE SIGMOIDOSCOPY SUBMUCOSAL TATTOO INJECTION  Patient location during evaluation: Phase II Anesthesia Type: General Level of consciousness: awake and alert and oriented Pain management: pain level controlled Vital Signs Assessment: post-procedure vital signs reviewed and stable Respiratory status: spontaneous breathing, nonlabored ventilation and respiratory function stable Cardiovascular status: stable Postop Assessment: no apparent nausea or vomiting Anesthetic complications: no   No complications documented.   Last Vitals:  Vitals:   02/18/21 0734  BP: 140/83  Resp: 16  Temp: 36.7 C  SpO2: 98%    Last Pain:  Vitals:   02/18/21 0933  TempSrc:   PainSc: 0-No pain                 Jamarquis Crull [place]

## 2021-02-18 NOTE — Op Note (Signed)
[facility] Patient Name: Alexandra Bright Procedure Date: 02/18/2021 9:22 AM MRN: [id number] Date of Birth: 12/06/1965 Attending MD: [name] ,  CSN: [id number] Age: 55 Admit Type: Outpatient Procedure:                Flexible Sigmoidoscopy Indications:              Rectal hemorrhage, Diarrhea Providers:                [name], Crystal Page, Raphael Gibney,                            Technician Referring MD:              Medicines:                Monitored Anesthesia Care Complications:            No immediate complications. Estimated Blood Loss:     Estimated blood loss: none. Procedure:                Pre-Anesthesia Assessment:                           - Prior to the procedure, a History and Physical                            was performed, and patient medications, allergies                            and sensitivities were reviewed. The patient's                            tolerance of previous anesthesia was reviewed.                           - The risks and benefits of the procedure and the                            sedation options and risks were discussed with the                            patient. All questions were answered and informed                            consent was obtained.                           - ASA Grade Assessment: II - A patient with mild                            systemic disease.                           After obtaining informed consent, the scope was                            passed under direct vision. The PCF-[id number]                            (  [id number]) scope was introduced through the anus and                            advanced to the the sigmoid colon. After obtaining                            informed consent, the scope was passed under direct                            vision. The PCF-[id number] ([id number]) scope was                            introduced through the anus and advanced to the the                             sigmoid colon. The GIF-[id number] ([id number]) scope was                            introduced through the anus and advanced to the 30                            cm from the anal verge. The flexible sigmoidoscopy                            was accomplished without difficulty. The patient                            tolerated the procedure well. The quality of the                            bowel preparation was excellent. Scope In: 9:41:35 AM Scope Out: 10:18:26 AM Total Procedure Duration: 0 hours 36 minutes 51 seconds  Findings:      The perianal and digital rectal examinations were normal.      An ulcerated partially obstructing large mass was found from 12 to 17 cm       proximal to the anus. Neither the pediatric or slim colonoscope could be       advanced past 12 cm. Decision was made to switch to an ultra slim upper       endoscope, which I was able to advance proximal to the lesion. The mass       was circumferential. The mass measured five cm in length. No bleeding       was present but the mass was friable. This was biopsied with a cold       forceps for histology.      No alteration were found between 30 to 17 cm from the anal verge. An       area just distal to the lesion was successfully injected with 3 mL [place]       ink for tattooing.      A few small-mouthed diverticula were found in the sigmoid colon.      Two sessile polyps were found in the distal sigmoid colon. The polyps       were 3 to 4 mm in size. These polyps were removed  with a cold snare.       Resection and retrieval were complete. 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 Biopsied.                            Injected.                           - Diverticulosis in the sigmoid colon.                           - Two 3 to 4 mm polyps in the distal sigmoid colon,                            removed with a cold snare. Resected  and retrieved. Moderate Sedation:      Per Anesthesia Care Recommendation:           - Discharge patient to home (ambulatory).                           - Resume previous diet.                           - Await pathology results.                           - Repeat colonoscopy for surveillance based on                            pathology results.                           - Schedule CT chest/abdomen/pelvis with IV contrast. Procedure Code(s):        --- Professional ---                           [phone], Sigmoidoscopy, flexible; with removal of                            tumor(s), polyp(s), or other lesion(s) by snare                            technique                           60737, 27, Sigmoidoscopy, flexible; with biopsy,                            single or multiple                           10626, Sigmoidoscopy, flexible; with directed  submucosal injection(s), any substance Diagnosis Code(s):        --- Professional ---                           D49.0, Neoplasm of unspecified behavior of                            digestive system                           K56.690, Other partial intestinal obstruction                           K63.5, Polyp of colon                           K62.5, Hemorrhage of anus and rectum                           R19.7, Diarrhea, unspecified                           K57.30, Diverticulosis of large intestine without                            perforation or abscess without bleeding CPT copyright 2019 American Medical Association. All rights reserved. The codes documented in this report are preliminary and upon coder review may  be revised to meet current compliance requirements. [name], MD [name],  02/18/2021 10:32:00 AM This report has been signed electronically. Number of Addenda: 0

## 2021-02-18 NOTE — Telephone Encounter (Signed)
Per op note - she needs CT A/P & Chest

## 2021-02-18 NOTE — Telephone Encounter (Signed)
Patients spouse called stated patient had procedure this morning and she really doesn't remember what you told her - spouse would like a call back - ph# [phone] Timmothy Sours)

## 2021-02-18 NOTE — Interval H&P Note (Signed)
History and Physical Interval Note:  02/18/2021 8:24 AM Alexandra Bright is a 55 y.o. female with PMH HTN, prediabetes and anxiety, who presents for evaluation of change in her bowel movements and blood in her stool.  Patient reports that she has persisted with bloating episodes and episodes of "mushy bowel movements", states this happens 6-8 times a day. The bowel movements are small in size but they have a "musghy consistency" and some of them have some scant blood and mucus. Also reports having cramping before needing to movem her bowels. Took IBGard x1 with no improvement.  BP 140/83   Temp 98.1 F (36.7 C) (Oral)   Resp 16   Ht 5\' 1"  (1.549 m)   Wt 62.6 kg   SpO2 98%   BMI 26.07 kg/m  GENERAL: The patient is AO x3, in no acute distress. HEENT: Head is normocephalic and atraumatic. EOMI are intact. Mouth is well hydrated and without lesions. NECK: Supple. No masses LUNGS: Clear to auscultation. No presence of rhonchi/wheezing/rales. Adequate chest expansion HEART: RRR, normal s1 and s2. ABDOMEN: Soft, nontender, no guarding, no peritoneal signs, and nondistended. BS +. No masses. EXTREMITIES: Without any cyanosis, clubbing, rash, lesions or edema. NEUROLOGIC: AOx3, no focal motor deficit. SKIN: no jaundice, no rashes   Alexandra Bright  has presented today for surgery, with the diagnosis of Rectal Bleeding.  The various methods of treatment have been discussed with the patient and family. After consideration of risks, benefits and other options for treatment, the patient has consented to  Procedure(s) with comments: COLONOSCOPY WITH PROPOFOL (N/A) - AM as a surgical intervention.  The patient's history has been reviewed, patient examined, no change in status, stable for surgery.  I have reviewed the patient's chart and labs.  Questions were answered to the patient's satisfaction.     [name]

## 2021-02-18 NOTE — Telephone Encounter (Signed)
I had an extensive discussion with the patient and the husband regarding the findings of a mass in the sigmoid suggestive of colorectal cancer.  I discussed about the next steps in her diagnosis along as the potential treatments and prognosis.  She understands that she needs to undergo the chest, abdomen and pelvis CT to stage her cancer.  I will give her a call back with results of the biopsies and the imaging.  I will order a CEA test, which they will pick up tomorrow at the office.  Alexandra Peppers, MD Gastroenterology and Hepatology [facility]

## 2021-02-18 NOTE — Progress Notes (Signed)
Hi Darius Bump,   Can you please schedule a CT chest/abndomen/pelvis? Dx: colorectal cancer.  Thanks,  [name], MD Gastroenterology and Hepatology [facility]

## 2021-02-18 NOTE — Discharge Instructions (Signed)
You are being discharged to home.  Resume your previous diet.  We are waiting for your pathology results.  Your physician has recommended a repeat colonoscopy for surveillance based on pathology results.    Flexible Sigmoidoscopy, Care After This sheet gives you information about how to care for yourself after your procedure. Your health care provider may also give you more specific instructions. If you have problems or questions, contact your health care provider. What can I expect after the procedure? After the procedure, it is common to have:  Cramping or pain in your abdomen.  Bloating.  A small amount of blood with your bowel movements. This may happen if a sample of tissue was removed for testing (biopsy). Follow these instructions at home: Eating and drinking  Drink enough fluid to keep your urine pale yellow.  Follow instructions from your health care provider about eating or drinking restrictions.  Resume your normal diet as instructed by your health care provider. Avoid heavy or fried foods that are hard to digest.   Activity  If you were given a medicine to help you relax (sedative) during the procedure, it can affect you for several hours. Do not drive or operate machinery until your health care provider says that it is safe.  Rest as told by your health care provider.  Return to your normal activities as told by your health care provider. Ask your health care provider what activities are safe for you.   General instructions  Take over-the-counter and prescription medicines only as told by your health care provider.  Try walking around when you have cramps or feel bloated.  Keep all follow-up visits as told by your health care provider. This is important. Contact a health care provider if:  You have pain or cramping in your abdomen that gets worse or is not helped with medicine.  You have a small amount of bleeding from your rectum that continues after 24  hours.  You have nausea or vomiting.  You feel weak or dizzy.  You develop a fever. Get help right away if:  You pass large blood clots or see a large amount of blood in the toilet after having a bowel movement.  You have severe pain in your abdomen.  You have nausea or vomiting for more than 24 hours after the procedure. Summary  After the procedure, you may have cramping or pain in your abdomen or you may have bloating. If you had a sample of tissue removed (biopsy), you may have a small amount of blood with your bowel movements.  Resume your normal diet as instructed by your health care provider. Avoid heavy or fried foods that are hard to digest.  Try walking around when you have cramps or feel bloated.  Get help right away if you pass large blood clots or see a large amount of blood in the toilet after having a bowel movement. This information is not intended to replace advice given to you by your health care provider. Make sure you discuss any questions you have with your health care provider. Document Revised: 11/05/2019 Document Reviewed: 11/05/2019 Elsevier Patient Education  2021 [employer].

## 2021-02-19 LAB — SURGICAL PATHOLOGY

## 2021-02-19 NOTE — Telephone Encounter (Signed)
noted 

## 2021-02-19 NOTE — Progress Notes (Signed)
CT is scheduled at [facility] 02/24/21 at 4:00 due to the immediate need patient is aware

## 2021-02-20 LAB — CEA: CEA: 1.8 ng/mL

## 2021-02-23 ENCOUNTER — Encounter (HOSPITAL_COMMUNITY): Payer: Self-pay | Admitting: Gastroenterology

## 2021-02-24 ENCOUNTER — Ambulatory Visit (HOSPITAL_COMMUNITY): Payer: 59

## 2021-02-26 ENCOUNTER — Other Ambulatory Visit: Payer: Self-pay

## 2021-02-26 ENCOUNTER — Ambulatory Visit (INDEPENDENT_AMBULATORY_CARE_PROVIDER_SITE_OTHER): Payer: 59 | Admitting: Internal Medicine

## 2021-02-26 ENCOUNTER — Encounter: Payer: Self-pay | Admitting: Internal Medicine

## 2021-02-26 VITALS — BP 132/83 | HR 93 | Temp 97.5°F | Ht 61.0 in | Wt 139.0 lb

## 2021-02-26 DIAGNOSIS — Z1159 Encounter for screening for other viral diseases: Secondary | ICD-10-CM

## 2021-02-26 DIAGNOSIS — I10 Essential (primary) hypertension: Secondary | ICD-10-CM

## 2021-02-26 DIAGNOSIS — G609 Hereditary and idiopathic neuropathy, unspecified: Secondary | ICD-10-CM

## 2021-02-26 DIAGNOSIS — Z114 Encounter for screening for human immunodeficiency virus [HIV]: Secondary | ICD-10-CM

## 2021-02-26 DIAGNOSIS — C189 Malignant neoplasm of colon, unspecified: Secondary | ICD-10-CM | POA: Diagnosis not present

## 2021-02-26 DIAGNOSIS — L309 Dermatitis, unspecified: Secondary | ICD-10-CM

## 2021-02-26 MED ORDER — GABAPENTIN 100 MG PO CAPS: 100.0000 mg | ORAL_CAPSULE | Freq: Two times a day (BID) | ORAL | 5 refills | Status: DC

## 2021-02-26 MED ORDER — TRIAMCINOLONE ACETONIDE 0.1 % EX CREA: 1.0000 "application " | TOPICAL_CREAM | Freq: Two times a day (BID) | CUTANEOUS | 0 refills | Status: DC

## 2021-02-26 MED ORDER — LOSARTAN POTASSIUM 50 MG PO TABS: 50.0000 mg | ORAL_TABLET | Freq: Every day | ORAL | 1 refills | Status: DC

## 2021-02-26 NOTE — Assessment & Plan Note (Signed)
Recently had sigmoidoscopy Planned to have CT chest, abdomen, pelvis Based on CT imaging, will be evaluated by Colorectal surgeon and/or Oncology

## 2021-02-26 NOTE — Assessment & Plan Note (Signed)
Rash likely eczematous Kenalog cream
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
## 2021-02-26 NOTE — Assessment & Plan Note (Signed)
Pins-and-needles sensation in bilateral UE and LE Continue gabapentin Advised to take vitamin B12 supplements.

## 2021-02-26 NOTE — Patient Instructions (Signed)
Please continue taking medications as prescribed.  Please do not take Aspirin, Ibuprofen or Naproxen.  Please get CT scan done as scheduled.

## 2021-02-26 NOTE — Assessment & Plan Note (Signed)
BP Readings from Last 1 Encounters:  02/26/21 132/83   Well-controlled with Losartan Counseled for compliance with the medications Advised DASH diet and moderate exercise/walking, at least 150 mins/week

## 2021-03-02 ENCOUNTER — Ambulatory Visit (INDEPENDENT_AMBULATORY_CARE_PROVIDER_SITE_OTHER): Payer: 59 | Admitting: Gastroenterology

## 2021-03-03 ENCOUNTER — Ambulatory Visit (HOSPITAL_COMMUNITY): Payer: 59

## 2021-03-06 ENCOUNTER — Ambulatory Visit (HOSPITAL_COMMUNITY)
Admission: RE | Admit: 2021-03-06 | Discharge: 2021-03-06 | Disposition: A | Discharge status: Home / Self Care | Payer: 59 | Source: Ambulatory Visit | Attending: Gastroenterology | Admitting: Gastroenterology

## 2021-03-06 ENCOUNTER — Encounter (HOSPITAL_COMMUNITY): Payer: Self-pay

## 2021-03-06 DIAGNOSIS — C187 Malignant neoplasm of sigmoid colon: Secondary | ICD-10-CM | POA: Diagnosis not present

## 2021-03-06 LAB — POCT I-STAT CREATININE: Creatinine, Ser: 0.9 mg/dL (ref 0.44–1.00)

## 2021-03-06 MED ORDER — IOHEXOL 300 MG/ML  SOLN
100.0000 mL | Freq: Once | INTRAMUSCULAR | Status: AC | PRN
  Administered 2021-03-06: 100 mL via INTRAVENOUS

## 2021-03-10 ENCOUNTER — Ambulatory Visit: Payer: Self-pay | Admitting: General Surgery

## 2021-03-10 NOTE — H&P (Signed)
The patient is a 55 year old female who presents with colorectal cancer. 55 year old female who presents to the office for evaluation of a newly diagnosed colon cancer. She states over the past few months. She is noted blood in her stools, as well as a change in her bowel habits. She reports difficulty with constipation her entire life and this is switched to loose stools and diarrhea. She underwent a flexible sigmoidoscopy. This was advanced approximately 30 cm from the anal verge. There was a partially obstructing large mass found in the rectosigmoid area approximately 12-17 cm proximal to the anus. This was biopsied and tattooed distally. Biopsies show adenocarcinoma. CT scan of the chest, abdomen and pelvis showed a mass at the rectosigmoid junction proximally 6 cm in length. There is no sign of bowel obstruction on CT scan. There is no sign of metastatic disease. Patient is currently having good bowel function using Senokot   Past Surgical History Janeann Forehand, CNA; 03/10/2021 9:27 AM) Oral Surgery  Diagnostic Studies History Janeann Forehand, CNA; 03/10/2021 9:27 AM) Colonoscopy within last year Mammogram 1-3 years ago Pap Smear 1-5 years ago  Allergies Janeann Forehand, CNA; 03/10/2021 9:28 AM) No Known Drug Allergies [03/10/2021]: Allergies Reconciled  Medication History Janeann Forehand, CNA; 03/10/2021 9:28 AM) buPROPion HCl ER (SR) (200MG  Tablet ER 12HR, Oral) Active. Diclofenac Sodium (75MG  Tablet DR, Oral) Active. Dicyclomine HCl (10MG  Capsule, Oral) Active. Gabapentin (100MG  Capsule, Oral) Active. Losartan Potassium (50MG  Tablet, Oral) Active. PEG 3350-KCl-Na Bicarb-NaCl (420GM For Solution, Oral) Active. Triamcinolone Acetonide (0.1% Cream, External) Active. Medications Reconciled  Social History Janeann Forehand, CNA; 03/10/2021 9:27 AM) Alcohol use Recently quit alcohol use. Caffeine use Carbonated beverages, Coffee, Tea. Illicit drug use  Prefer to discuss with provider. Tobacco use Former smoker.  Family History Janeann Forehand, CNA; 03/10/2021 9:27 AM) Cerebrovascular Accident Father. Diabetes Mellitus Father, Sister. Melanoma Mother.  Pregnancy / Birth History Janeann Forehand, CNA; 03/10/2021 9:27 AM) Age at menarche 80 years. Age of menopause 51-55 Contraceptive History Intrauterine device. Gravida 2 Length (months) of breastfeeding 3-6 Maternal age 81-25 Para 2 Regular periods  Other Problems Janeann Forehand, CNA; 03/10/2021 9:27 AM) Anxiety Disorder Back Pain High blood pressure Melanoma     Review of Systems Adriana Reams Alston CNA; 03/10/2021 9:27 AM) General Present- Fatigue. Not Present- Appetite Loss, Chills, Fever, Night Sweats, Weight Gain and Weight Loss. Skin Present- Rash. Not Present- Change in Wart/Mole, Dryness, Hives, Jaundice, New Lesions, Non-Healing Wounds and Ulcer. HEENT Present- Wears glasses/contact lenses. Not Present- Earache, Hearing Loss, Hoarseness, Nose Bleed, Oral Ulcers, Ringing in the Ears, Seasonal Allergies, Sinus Pain, Sore Throat, Visual Disturbances and Yellow Eyes. Respiratory Not Present- Bloody sputum, Chronic Cough, Difficulty Breathing, Snoring and Wheezing. Breast Not Present- Breast Mass, Breast Pain, Nipple Discharge and Skin Changes. Cardiovascular Not Present- Chest Pain, Difficulty Breathing Lying Down, Leg Cramps, Palpitations, Rapid Heart Rate, Shortness of Breath and Swelling of Extremities. Gastrointestinal Present- Abdominal Pain, Bloating, Bloody Stool, Change in Bowel Habits, Chronic diarrhea, Excessive gas and Indigestion. Not Present- Constipation, Difficulty Swallowing, Gets full quickly at meals, Hemorrhoids, Nausea, Rectal Pain and Vomiting. Female Genitourinary Not Present- Frequency, Nocturia, Painful Urination, Pelvic Pain and Urgency. Musculoskeletal Present- Back Pain, Joint Pain, Joint Stiffness, Muscle Pain and Muscle Weakness. Not  Present- Swelling of Extremities. Neurological Present- Decreased Memory, Headaches, Numbness and Tingling. Not Present- Fainting, Seizures, Tremor, Trouble walking and Weakness. Psychiatric Present- Anxiety. Not Present- Bipolar, Change in Sleep Pattern, Depression, Fearful and Frequent crying. Endocrine Present- Hot flashes. Not Present- Cold Intolerance,  Excessive Hunger, Hair Changes, Heat Intolerance and New Diabetes. Hematology Present- Easy Bruising. Not Present- Blood Thinners, Excessive bleeding, Gland problems, HIV and Persistent Infections.  Vitals Adriana Reams Alston CNA; 03/10/2021 9:29 AM) 03/10/2021 9:28 AM Weight: 138.13 lb Height: 61in Body Surface Area: 1.61 m Body Mass Index: 26.1 kg/m  Temp.: 97.66F  Pulse: 102 (Regular)  P.OX: 98% (Room air) BP: 126/80(Sitting, Left Arm, Standard)        Physical Exam Leighton Ruff MD; 8/67/6720 9:52 AM)  General Mental Status-Alert. General Appearance-Cooperative.  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 This is tattooed distally. CT scans and CEA level are all within normal limits. We'll plan on doing a robotic sigmoidectomy and possible low anterior resection. We have discussed this in detail. I have reviewed the CT scans and I think the chances of needing a diverting ileostomy are very low. This does not appear to be a true rectal tumor. The surgery and anatomy were described to the patient as well as the risks of surgery and the possible complications. These include: Bleeding, deep abdominal infections and possible wound complications such as hernia and infection, damage to adjacent structures, leak of surgical connections, which can lead to other surgeries and possibly an ostomy, possible need for other procedures, such as  abscess drains in radiology, possible prolonged hospital stay, possible diarrhea from removal of part of the colon, possible constipation from narcotics, possible bowel, bladder or sexual dysfunction if having rectal surgery, prolonged fatigue/weakness or appetite loss, possible early recurrence of of disease, possible complications of their medical problems such as heart disease or arrhythmias or lung problems, death (less than 1%). I believe the patient understands and wishes to proceed with the surgery.

## 2021-03-12 ENCOUNTER — Encounter (HOSPITAL_COMMUNITY): Payer: Self-pay

## 2021-03-12 NOTE — Progress Notes (Signed)
I placed an introductory phone call to this patient. I introduced myself and explained my role in the patient's care. The patient seems to be in good spirits but expresses anxiety over previous treatment delays. I reassure the patient that we will do everything in our power to ensure that necessary treatments occur in a timely manner, she is appreciative of this. I offer time for the patient to ask any questions. All questions were answered to the patient's satisfaction. I will plan to meet with the patient during initial visit with Dr. Delton Coombes

## 2021-03-16 ENCOUNTER — Inpatient Hospital Stay (HOSPITAL_COMMUNITY): Payer: 59 | Attending: Hematology | Admitting: Hematology

## 2021-03-16 ENCOUNTER — Other Ambulatory Visit (HOSPITAL_COMMUNITY): Payer: Self-pay

## 2021-03-16 ENCOUNTER — Other Ambulatory Visit: Payer: Self-pay

## 2021-03-16 ENCOUNTER — Encounter (HOSPITAL_COMMUNITY): Payer: Self-pay | Admitting: Hematology

## 2021-03-16 VITALS — BP 124/86 | HR 92 | Temp 97.0°F | Resp 16 | Ht 61.0 in | Wt 138.8 lb

## 2021-03-16 DIAGNOSIS — C187 Malignant neoplasm of sigmoid colon: Secondary | ICD-10-CM | POA: Diagnosis not present

## 2021-03-16 DIAGNOSIS — G629 Polyneuropathy, unspecified: Secondary | ICD-10-CM

## 2021-03-16 MED ORDER — TRAMADOL HCL 50 MG PO TABS: 50.0000 mg | ORAL_TABLET | Freq: Every day | ORAL | 0 refills | Status: DC | PRN

## 2021-03-16 NOTE — Patient Instructions (Signed)
Alexandra Bright at [facility] Discharge Instructions  You were seen and examined today by Dr. Delton Coombes. Dr. Delton Coombes is a medical oncologist, meaning he specializes in the management of cancer diagnoses with medications. Dr. Delton Coombes discussed your past medical history, family history of cancer and the events that led to you being here today.  You were referred to Dr. Delton Coombes by Dr. Jenetta Downer due to newly diagnosed colon cancer. Dr. Delton Coombes has recommended a MRI of your pelvis to ensure the exact location of the tumor. If the tumor is involved in the rectum, you will need chemotherapy and radiation prior to surgery. If the tumor is located solely in the sigmoid colon, you can proceed with surgery and Dr. Delton Coombes will follow-up with you about a month after surgery to decide if there is a need for chemotherapy at that time.  Follow-up as scheduled.   Thank you for choosing [place] at [facility] to provide your oncology and hematology care.  To afford each patient quality time with our provider, please arrive at least 15 minutes before your scheduled appointment time.   If you have a lab appointment with the [facility] please come in thru the Main Entrance and check in at the main information desk.  You need to re-schedule your appointment should you arrive 10 or more minutes late.  We strive to give you quality time with our providers, and arriving late affects you and other patients whose appointments are after yours.  Also, if you no show three or more times for appointments you may be dismissed from the clinic at the providers discretion.     Again, thank you for choosing [facility].  Our hope is that these requests will decrease the amount of time that you wait before being seen by our physicians.       _____________________________________________________________  Should you have questions after your visit to [facility], please contact our office at [phone] and follow the prompts.  Our office hours are 8:00 a.m. and 4:30 p.m. Monday - Friday.  Please note that voicemails left after 4:00 p.m. may not be returned until the following business day.  We are closed weekends and major holidays.  You do have access to a nurse 24-7, just call the main number to the clinic [phone] and do not press any options, hold on the line and a nurse will answer the phone.    For prescription refill requests, have your pharmacy contact our office and allow 72 hours.    Due to Covid, you will need to wear a mask upon entering the hospital. If you do not have a mask, a mask will be given to you at the Main Entrance upon arrival. For doctor visits, patients may have 1 support person age 55 or older with them. For treatment visits, patients can not have anyone with them due to social distancing guidelines and our immunocompromised population.
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
## 2021-03-17 ENCOUNTER — Encounter (HOSPITAL_COMMUNITY): Payer: Self-pay

## 2021-03-17 NOTE — Progress Notes (Signed)
I met with the patient and her husband during initial visit with Dr. Delton Coombes on 03/16/2021. Patient and husband were given the opportunity to ask questions and all were answered to their satisfaction. Financial Counselor made aware of patient and family concerns regarding prior authorizations and will assist with ensuring PAs. I provided my contact information and encouraged the patient and her husband to call the clinic with questions or concerns.

## 2021-03-30 ENCOUNTER — Ambulatory Visit (HOSPITAL_COMMUNITY): Payer: 59

## 2021-04-06 ENCOUNTER — Other Ambulatory Visit (HOSPITAL_COMMUNITY): Payer: Self-pay

## 2021-04-06 ENCOUNTER — Ambulatory Visit (HOSPITAL_COMMUNITY)
Admission: RE | Admit: 2021-04-06 | Discharge: 2021-04-06 | Disposition: A | Discharge status: Home / Self Care | Payer: 59 | Source: Ambulatory Visit | Attending: Hematology | Admitting: Hematology

## 2021-04-06 ENCOUNTER — Other Ambulatory Visit (HOSPITAL_COMMUNITY): Payer: Self-pay | Admitting: Hematology

## 2021-04-06 ENCOUNTER — Other Ambulatory Visit: Payer: Self-pay

## 2021-04-06 DIAGNOSIS — C187 Malignant neoplasm of sigmoid colon: Secondary | ICD-10-CM | POA: Diagnosis present
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
## 2021-04-08 ENCOUNTER — Other Ambulatory Visit: Payer: Self-pay

## 2021-04-08 ENCOUNTER — Inpatient Hospital Stay (HOSPITAL_COMMUNITY): Payer: 59 | Attending: Hematology | Admitting: Hematology

## 2021-04-08 VITALS — BP 121/76 | HR 81 | Temp 97.1°F | Resp 18 | Wt 138.2 lb

## 2021-04-08 DIAGNOSIS — G629 Polyneuropathy, unspecified: Secondary | ICD-10-CM | POA: Insufficient documentation

## 2021-04-08 DIAGNOSIS — K5900 Constipation, unspecified: Secondary | ICD-10-CM | POA: Insufficient documentation

## 2021-04-08 DIAGNOSIS — Z87891 Personal history of nicotine dependence: Secondary | ICD-10-CM | POA: Insufficient documentation

## 2021-04-08 DIAGNOSIS — C20 Malignant neoplasm of rectum: Secondary | ICD-10-CM | POA: Diagnosis not present

## 2021-04-08 DIAGNOSIS — C19 Malignant neoplasm of rectosigmoid junction: Secondary | ICD-10-CM | POA: Insufficient documentation

## 2021-04-08 DIAGNOSIS — C187 Malignant neoplasm of sigmoid colon: Secondary | ICD-10-CM

## 2021-04-08 DIAGNOSIS — R109 Unspecified abdominal pain: Secondary | ICD-10-CM | POA: Insufficient documentation

## 2021-04-08 DIAGNOSIS — Z809 Family history of malignant neoplasm, unspecified: Secondary | ICD-10-CM | POA: Diagnosis not present

## 2021-04-08 DIAGNOSIS — K6289 Other specified diseases of anus and rectum: Secondary | ICD-10-CM | POA: Diagnosis not present

## 2021-04-08 DIAGNOSIS — Z801 Family history of malignant neoplasm of trachea, bronchus and lung: Secondary | ICD-10-CM | POA: Insufficient documentation

## 2021-04-08 DIAGNOSIS — R5383 Other fatigue: Secondary | ICD-10-CM | POA: Diagnosis not present

## 2021-04-08 NOTE — Patient Instructions (Addendum)
[facility] at [facility] Discharge Instructions  You were seen today by Dr. Katragadda. He went over your recent results. Dr. Katragadda will see you back in for labs and follow up.   Thank you for choosing [facility] at [facility] to provide your oncology and hematology care.  To afford each patient quality time with our provider, please arrive at least 15 minutes before your scheduled appointment time.   If you have a lab appointment with the Cancer Center please come in thru the Main Entrance and check in at the main information desk  You need to re-schedule your appointment should you arrive 10 or more minutes late.  We strive to give you quality time with our providers, and arriving late affects you and other patients whose appointments are after yours.  Also, if you no show three or more times for appointments you may be dismissed from the clinic at the providers discretion.     Again, thank you for choosing [facility].  Our hope is that these requests will decrease the amount of time that you wait before being seen by our physicians.       _____________________________________________________________  Should you have questions after your visit to [facility], please contact our office at [phone] between the hours of 8:00 a.m. and 4:30 p.m.  Voicemails left after 4:00 p.m. will not be returned until the following business day.  For prescription refill requests, have your pharmacy contact our office and allow 72 hours.    Cancer Center Support Programs:   > Cancer Support Group  2nd Tuesday of the month 1pm-2pm, Journey Room    

## 2021-04-09 ENCOUNTER — Encounter (HOSPITAL_COMMUNITY): Payer: Self-pay | Admitting: Lab

## 2021-04-09 NOTE — Patient Instructions (Signed)
DUE TO COVID-19 ONLY ONE VISITOR IS ALLOWED TO COME WITH YOU AND STAY IN THE WAITING ROOM ONLY DURING PRE OP AND PROCEDURE DAY OF SURGERY. THE 2 VISITORS  MAY VISIT WITH YOU AFTER SURGERY IN YOUR PRIVATE ROOM DURING VISITING HOURS ONLY!  YOU NEED TO HAVE A COVID 19 TEST ON_5/25______ @_______ , THIS TEST MUST BE DONE BEFORE SURGERY,  COVID TESTING SITE [address] [place] [address], IT IS ON THE RIGHT GOING OUT [address] APPROXIMATELY  2 MINUTES PAST [employer] ON THE RIGHT. ONCE YOUR COVID TEST IS COMPLETED,  PLEASE BEGIN THE QUARANTINE INSTRUCTIONS AS OUTLINED IN YOUR HANDOUT.                Alexandra Bright    Your procedure is scheduled on: 04/17/21   Report to [facility] Main  Entrance   Report to admitting at  10:00 AM     Call this number if you have problems the morning of surgery [phone]   Follow instructions from Dr. Manon Hilding office for bowel prep.  Drink plenty of fluids the day of prep to prevent dehydration.    BRUSH YOUR TEETH MORNING OF SURGERY AND RINSE YOUR MOUTH OUT, NO CHEWING GUM CANDY OR MINTS.    DRINK 2 PRESURGERY ENSURE DRINKS THE NIGHT BEFORE SURGERY AT 10:00 PM .   NO SOLIDS AFTER MIDNIGHT THE DAY PRIOR TO THE SURGERY.   NOTHING BY MOUTH EXCEPT CLEAR LIQUIDS UNTIL 9:00 AM  CLEAR LIQUID DIET   Foods Allowed                                                                     Foods Excluded  Coffee and tea, regular and decaf                             liquids that you cannot  Plain Jell-O any favor except red or purple                                           see through such as: Fruit ices (not with fruit pulp)                                     milk, soups, orange juice  Iced Popsicles                                    All solid food Carbonated beverages, regular and diet                                    Cranberry, grape and apple juices Sports drinks like Gatorade Lightly seasoned clear broth or  consume(fat free) Sugar, honey syrup       Take these medicines the morning of surgery with A SIP OF WATER: Gabapentin, Wellbutrin  You may not have any metal on your body including hair pins and              piercings  Do not wear jewelry, make-up, lotions, powders or perfumes, deodorant             Do not wear nail polish on your fingernails.  Do not shave  48 hours prior to surgery.     Do not bring valuables to the hospital. [facility].  Contacts, dentures or bridgework may not be worn into surgery.                   Please read over the following fact sheets you were given: _____________________________________________________________________             [facility] - Preparing for Surgery Before surgery, you can play an important role.  Because skin is not sterile, your skin needs to be as free of germs as possible.  You can reduce the number of germs on your skin by washing with CHG (chlorahexidine gluconate) soap before surgery.  CHG is an antiseptic cleaner which kills germs and bonds with the skin to continue killing germs even after washing. Please DO NOT use if you have an allergy to CHG or antibacterial soaps.  If your skin becomes reddened/irritated stop using the CHG and inform your nurse when you arrive at Short Stay. Do not shave (including legs and underarms) for at least 48 hours prior to the first CHG shower.    Please follow these instructions carefully:  1.  Shower with CHG Soap the night before surgery and the  morning of Surgery.  2.  If you choose to wash your hair, wash your hair first as usual with your  normal  shampoo.  3.  After you shampoo, rinse your hair and body thoroughly to remove the  shampoo.                                        4.  Use CHG as you would any other liquid soap.  You can apply chg directly  to the skin and wash                       Gently with a  scrungie or clean washcloth.  5.  Apply the CHG Soap to your body ONLY FROM THE NECK DOWN.   Do not use on face/ open                           Wound or open sores. Avoid contact with eyes, ears mouth and genitals (private parts).                       Wash face,  Genitals (private parts) with your normal soap.             6.  Wash thoroughly, paying special attention to the area where your surgery  will be performed.  7.  Thoroughly rinse your body with warm water from the neck down.  8.  DO NOT shower/wash with your normal soap after using and rinsing off  the CHG Soap.  9.  Pat yourself dry with a clean towel.            10.  Wear clean pajamas.            11.  Place clean sheets on your bed the night of your first shower and do not  sleep with pets. Day of Surgery : Do not apply any lotions/deodorants the morning of surgery.  Please wear clean clothes to the hospital/surgery center.  FAILURE TO FOLLOW THESE INSTRUCTIONS MAY RESULT IN THE CANCELLATION OF YOUR SURGERY PATIENT SIGNATURE_________________________________  NURSE SIGNATURE__________________________________  ________________________________________________________________________   Alexandra Bright  An incentive spirometer is a tool that can help keep your lungs clear and active. This tool measures how well you are filling your lungs with each breath. Taking long deep breaths may help reverse or decrease the chance of developing breathing (pulmonary) problems (especially infection) following:  A long period of time when you are unable to move or be active. BEFORE THE PROCEDURE   If the spirometer includes an indicator to show your best effort, your nurse or respiratory therapist will set it to a desired goal.  If possible, sit up straight or lean slightly forward. Try not to slouch.  Hold the incentive spirometer in an upright position. INSTRUCTIONS FOR USE  1. Sit on the edge of your bed if possible, or  sit up as far as you can in bed or on a chair. 2. Hold the incentive spirometer in an upright position. 3. Breathe out normally. 4. Place the mouthpiece in your mouth and seal your lips tightly around it. 5. Breathe in slowly and as deeply as possible, raising the piston or the ball toward the top of the column. 6. Hold your breath for 3-5 seconds or for as long as possible. Allow the piston or ball to fall to the bottom of the column. 7. Remove the mouthpiece from your mouth and breathe out normally. 8. Rest for a few seconds and repeat Steps 1 through 7 at least 10 times every 1-2 hours when you are awake. Take your time and take a few normal breaths between deep breaths. 9. The spirometer may include an indicator to show your best effort. Use the indicator as a goal to work toward during each repetition. 10. After each set of 10 deep breaths, practice coughing to be sure your lungs are clear. If you have an incision (the cut made at the time of surgery), support your incision when coughing by placing a pillow or rolled up towels firmly against it. Once you are able to get out of bed, walk around indoors and cough well. You may stop using the incentive spirometer when instructed by your caregiver.  RISKS AND COMPLICATIONS  Take your time so you do not get dizzy or light-headed.  If you are in pain, you may need to take or ask for pain medication before doing incentive spirometry. It is harder to take a deep breath if you are having pain. AFTER USE  Rest and breathe slowly and easily.  It can be helpful to keep track of a log of your progress. Your caregiver can provide you with a simple table to help with this. If you are using the spirometer at home, follow these instructions: [facility] IF:   You are having difficultly using the spirometer.  You have trouble using the spirometer as often as instructed.  Your pain medication is not giving enough relief while using the  spirometer.  You  develop fever of 100.5 F (38.1 C) or higher. SEEK IMMEDIATE MEDICAL CARE IF:   You cough up bloody sputum that had not been present before.  You develop fever of 102 F (38.9 C) or greater.  You develop worsening pain at or near the incision site. MAKE SURE YOU:   Understand these instructions.  Will watch your condition.  Will get help right away if you are not doing well or get worse. Document Released: 03/21/2007 Document Revised: 01/31/2012 Document Reviewed: 05/22/2007 [facility] Patient Information 2014 [place], [place].   ________________________________________________________________________

## 2021-04-09 NOTE — Progress Notes (Unsigned)
Referral to [facility].  Records faxed on 5/19

## 2021-04-10 ENCOUNTER — Encounter (HOSPITAL_COMMUNITY)
Admission: RE | Admit: 2021-04-10 | Discharge: 2021-04-10 | Disposition: A | Discharge status: Home / Self Care | Payer: 59 | Source: Ambulatory Visit | Attending: Internal Medicine | Admitting: Internal Medicine
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
## 2021-04-13 ENCOUNTER — Other Ambulatory Visit (HOSPITAL_COMMUNITY): Payer: Self-pay

## 2021-04-13 ENCOUNTER — Other Ambulatory Visit: Payer: Self-pay

## 2021-04-13 ENCOUNTER — Telehealth (HOSPITAL_COMMUNITY): Payer: Self-pay | Admitting: Pharmacist

## 2021-04-13 ENCOUNTER — Other Ambulatory Visit (HOSPITAL_COMMUNITY): Payer: Self-pay | Admitting: *Deleted

## 2021-04-13 ENCOUNTER — Telehealth (HOSPITAL_COMMUNITY): Payer: Self-pay | Admitting: Pharmacy Technician

## 2021-04-13 ENCOUNTER — Inpatient Hospital Stay (HOSPITAL_BASED_OUTPATIENT_CLINIC_OR_DEPARTMENT_OTHER): Payer: 59 | Admitting: Hematology

## 2021-04-13 VITALS — BP 134/89 | HR 98 | Temp 97.3°F | Resp 18 | Wt 138.1 lb

## 2021-04-13 DIAGNOSIS — C19 Malignant neoplasm of rectosigmoid junction: Secondary | ICD-10-CM | POA: Diagnosis not present

## 2021-04-13 DIAGNOSIS — C187 Malignant neoplasm of sigmoid colon: Secondary | ICD-10-CM | POA: Diagnosis not present

## 2021-04-13 DIAGNOSIS — C20 Malignant neoplasm of rectum: Secondary | ICD-10-CM

## 2021-04-13 MED ORDER — CAPECITABINE 500 MG PO TABS: 1500.0000 mg | ORAL_TABLET | Freq: Two times a day (BID) | ORAL | 0 refills | Status: DC

## 2021-04-13 MED ORDER — TRAMADOL HCL 50 MG PO TABS: 50.0000 mg | ORAL_TABLET | Freq: Every day | ORAL | 0 refills | Status: DC | PRN

## 2021-04-13 MED ORDER — CAPECITABINE 500 MG PO TABS
1500.0000 mg | ORAL_TABLET | Freq: Two times a day (BID) | ORAL | 0 refills | Status: DC
  Filled 2021-04-13: qty 168, 28d supply, fill #0

## 2021-04-13 MED ORDER — PROCHLORPERAZINE MALEATE 10 MG PO TABS: 10.0000 mg | ORAL_TABLET | Freq: Four times a day (QID) | ORAL | 3 refills | Status: DC | PRN

## 2021-04-13 NOTE — Telephone Encounter (Signed)
Oral Oncology Patient Advocate Encounter  Received notification from [employer] that prior authorization for Xeloda is required.  PA submitted on CoverMyMeds Key [id number] Status is pending  Oral Oncology Clinic will continue to follow.  [place] Patient [place] Phone [phone] Fax [phone] 04/13/2021 3:16 PM

## 2021-04-13 NOTE — Progress Notes (Signed)
GI Location of Tumor / Histology: Sigmoid adenocarcinoma  Alexandra Bright presented with bleeding per rectum for the last 5 to 6 months.  MRI Pelvis 04/06/2021: high rectal T3c/T4AN1 malignancy  CT CAP 03/06/2021: circumferential wall thickening, hyperenhancement and fat stranding about the rectosigmoid junction, mass measuring approximately 6 cm in length. No evidence of lymphadenopathy or metastatic disease.  Colonoscopy 02/18/2021: showed ulcerated partially obstructing mass found from 12-17 cm from the anal verge, circumferential measuring 5 cm in length.  Biopsies of Colon Mass 02/18/2021    Past/Anticipated interventions by surgeon, if any:   Past/Anticipated interventions by medical oncology, if any:  Alexandra Bright 04/13/2021 - I have reached out to Alexandra Bright and discussed the Bright with her. - She is having pencil shaped stools.  As the colonoscopy showing near complete obstructed mass, it was thought to be better for her to proceed with chemoradiation therapy first than TNT.  This is to avoid any impending obstruction. - She will meet with Alexandra Bright tomorrow. - I have discussed with her about starting her on capecitabine 500 mg 3 tablets twice daily Monday through Friday throughout the course of radiation. - We talked about side effects including but not limited to cytopenias, GI symptoms, hand-foot skin reaction among others.  We have sent a prescription to specialty pharmacy. - She will use Compazine as needed for nausea and Imodium as needed for diarrhea. - I will see her back during the first week of treatment.   Weight changes, if any: Stable at 138 lb.  Bowel/Bladder complaints, if any: "Stringy", thin soft stools.  Taking stool softeners daily.   Nausea / Vomiting, if any: No  Pain issues, if any: Pain with each bowel movement    Any blood per rectum: Has occasional bleeding with stools, especially hard stools.    SAFETY ISSUES:  Prior radiation? no  Pacemaker/ICD?  no  Possible current pregnancy? Postmenopausal, has IUD  Is the patient on methotrexate? no  Current Complaints/Details:

## 2021-04-13 NOTE — Telephone Encounter (Addendum)
Oral Oncology Pharmacist Encounter  Received new prescription for Xeloda (capecitabine) for the treatment of stage IIIB rectosigmoid sigmoid adenocarcinoma in conjunction with XRT, planned duration until the end of XRT. Ms. Case has he consult appt with rad onc on 04/14/21.  I-STAT creatinine from 03/06/21 assessed, CrCl ~70 ml/min. Prescription dose and frequency assessed.   Current medication list in Epic reviewed, no DDIs with capecitabine identified.  Evaluated chart and no patient barriers to medication adherence identified.   Prescription has been e-scribed to the [facility] for benefits analysis and approval.  Oral Oncology Clinic will continue to follow for insurance authorization, copayment issues, initial counseling and start date.  Darl Pikes, PharmD, BCPS, BCOP, CPP Hematology/Oncology Clinical Pharmacist Practitioner [facility] [phone]  04/13/2021 2:14 PM

## 2021-04-13 NOTE — Progress Notes (Signed)
START ON PATHWAY REGIMEN - Colorectal     A cycle is every 7 days:     Capecitabine   **Always confirm dose/schedule in your pharmacy ordering system**  Patient Characteristics: Preoperative or Nonsurgical Candidate (Clinical Staging), Rectal, cT3 - cT4, cN0 or Any cT, cN+ Tumor Location: Rectal Therapeutic Status: Preoperative or Nonsurgical Candidate (Clinical Staging) AJCC T Category: cT4a AJCC N Category: cN1b AJCC M Category: cM0 AJCC 8 Stage Grouping: IIIB Intent of Therapy: Curative Intent, Discussed with Patient

## 2021-04-13 NOTE — Telephone Encounter (Signed)
Oral Chemotherapy Pharmacist Encounter  Due to insurance restriction the medication could not be filled at [facility]. Prescription has been e-scribed to [facility].  Supportive information was faxed to [facility]. We will continue to follow medication access.   Called and notified patient, provided with new pharmacy number.  Darl Pikes, PharmD, BCPS, [facility] Hematology/Oncology Clinical Pharmacist [facility] [phone]  04/13/2021 3:49 PM

## 2021-04-13 NOTE — Telephone Encounter (Signed)
Oral Oncology Patient Advocate Encounter  Prior Authorization for Xeloda has been approved.    PA# [id number] Effective dates: 04/13/21 through 04/12/22  Patient must use [facility] pharmacy per insurance. Will follow up for delivery and any copay issues.  Oral Oncology Clinic will continue to follow.   [place] Patient [place] Phone [phone] Fax [phone] 04/13/2021 3:47 PM

## 2021-04-13 NOTE — Patient Instructions (Signed)
[place] at [facility] Discharge Instructions  You were seen today by Dr. Delton Coombes. He went over your recent results. You have been prescribed Xeloda. A side effect of Xeloda is diarrhea; purchase Imodium over the counter in order to counteract this side effect. You have also been prescribed Compazine to treat any nausea. Dr. Delton Coombes will see you back in for labs and follow up.   Thank you for choosing [place] at [facility] to provide your oncology and hematology care.  To afford each patient quality time with our provider, please arrive at least 15 minutes before your scheduled appointment time.   If you have a lab appointment with the [facility] please come in thru the Main Entrance and check in at the main information desk  You need to re-schedule your appointment should you arrive 10 or more minutes late.  We strive to give you quality time with our providers, and arriving late affects you and other patients whose appointments are after yours.  Also, if you no show three or more times for appointments you may be dismissed from the clinic at the providers discretion.     Again, thank you for choosing [facility].  Our hope is that these requests will decrease the amount of time that you wait before being seen by our physicians.       _____________________________________________________________  Should you have questions after your visit to [facility], please contact our office at [phone] between the hours of 8:00 a.m. and 4:30 p.m.  Voicemails left after 4:00 p.m. will not be returned until the following business day.  For prescription refill requests, have your pharmacy contact our office and allow 72 hours.    Cancer Center Support Programs:   > Cancer Support Group  2nd Tuesday of the month 1pm-2pm, Journey Room

## 2021-04-14 ENCOUNTER — Ambulatory Visit
Admission: RE | Admit: 2021-04-14 | Discharge: 2021-04-14 | Disposition: A | Discharge status: Home / Self Care | Payer: 59 | Source: Ambulatory Visit | Attending: Radiation Oncology | Admitting: Radiation Oncology

## 2021-04-14 ENCOUNTER — Encounter: Payer: Self-pay | Admitting: Radiation Oncology

## 2021-04-14 ENCOUNTER — Other Ambulatory Visit (HOSPITAL_COMMUNITY): Payer: 59

## 2021-04-14 ENCOUNTER — Other Ambulatory Visit: Payer: Self-pay

## 2021-04-14 DIAGNOSIS — C20 Malignant neoplasm of rectum: Secondary | ICD-10-CM
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
## 2021-04-15 ENCOUNTER — Ambulatory Visit
Admission: RE | Admit: 2021-04-15 | Discharge: 2021-04-15 | Disposition: A | Discharge status: Home / Self Care | Payer: 59 | Source: Ambulatory Visit | Attending: Radiation Oncology | Admitting: Radiation Oncology

## 2021-04-15 DIAGNOSIS — C20 Malignant neoplasm of rectum: Secondary | ICD-10-CM | POA: Diagnosis present

## 2021-04-15 NOTE — Telephone Encounter (Signed)
Copay is $63 per [employer] rep.  Called patient this morning and she is okay with the $63 copay.  [place] Patient [place] Phone [phone] Fax [phone] 04/15/2021 9:52 AM

## 2021-04-16 NOTE — Telephone Encounter (Signed)
Oral Chemotherapy Pharmacist Encounter  Patient Education I spoke with patient for overview of new oral chemotherapy medication: Xeloda (capecitabine) for the treatment of stage IIIB rectosigmoid sigmoid adenocarcinoma in conjunction with XRT, planned duration until the end of XRT. Radiation is scheduled to start on 04/22/2021.  Counseled patient on administration, dosing, side effects, monitoring, drug-food interactions, safe handling, storage, and disposal. Patient will take 3 tablets (1,500 mg total) by mouth 2 (two) times daily after a meal. Take Monday through Friday. Take only on days of radiation.  Side effects include but not limited to: diarrhea, hand-foot syndrome, fatigue, decreased WBC count.  - Diarrhea: PT reported having loperamide on hand.   Reviewed with patient importance of keeping a medication schedule and plan for any missed doses.  After discussion with patient no patient barriers to medication adherence identified.   Alexandra Bright voiced understanding and appreciation. All questions answered. Medication handout provided.  Alexandra Bright reported hearing from [employer] but not yet being set up for delivery, Alexandra Bright will look into this.   Provided patient with [facility] [place] [facility] phone number. Patient knows to call the office with questions or concerns. Oral Chemotherapy [facility] will continue to follow.  Darl Pikes, PharmD, BCPS, BCOP, CPP Hematology/Oncology Clinical Pharmacist Practitioner [facility] [phone]  04/16/2021 3:52 PM

## 2021-04-17 ENCOUNTER — Inpatient Hospital Stay (HOSPITAL_COMMUNITY): Admission: RE | Admit: 2021-04-17 | Payer: 59 | Source: Home / Self Care | Admitting: General Surgery

## 2021-04-17 ENCOUNTER — Encounter (HOSPITAL_COMMUNITY): Admission: RE | Payer: Self-pay | Source: Home / Self Care

## 2021-04-17 SURGERY — COLECTOMY, PARTIAL, ROBOT-ASSISTED, LAPAROSCOPIC
Anesthesia: General

## 2021-04-21 ENCOUNTER — Ambulatory Visit: Payer: 59 | Admitting: Radiation Oncology

## 2021-04-21 NOTE — Telephone Encounter (Signed)
Per Sharl Ma at [employer], copay of $63 was for an 18 day supply per insurance.  They were able to get an override from insurance and get patient a 30 day supply for $90.  Patient was okay with that since it would save her money.  [place] Patient [place] Phone [phone] Fax [phone] 04/21/2021 10:14 AM

## 2021-04-22 ENCOUNTER — Other Ambulatory Visit: Payer: Self-pay

## 2021-04-22 ENCOUNTER — Ambulatory Visit
Admission: RE | Admit: 2021-04-22 | Discharge: 2021-04-22 | Disposition: A | Discharge status: Home / Self Care | Payer: 59 | Source: Ambulatory Visit | Attending: Radiation Oncology | Admitting: Radiation Oncology

## 2021-04-22 DIAGNOSIS — R21 Rash and other nonspecific skin eruption: Secondary | ICD-10-CM | POA: Diagnosis not present

## 2021-04-22 DIAGNOSIS — C20 Malignant neoplasm of rectum: Secondary | ICD-10-CM | POA: Insufficient documentation

## 2021-04-22 DIAGNOSIS — Z79899 Other long term (current) drug therapy: Secondary | ICD-10-CM | POA: Diagnosis not present

## 2021-04-22 DIAGNOSIS — G629 Polyneuropathy, unspecified: Secondary | ICD-10-CM | POA: Diagnosis not present

## 2021-04-22 DIAGNOSIS — G893 Neoplasm related pain (acute) (chronic): Secondary | ICD-10-CM | POA: Diagnosis not present

## 2021-04-22 DIAGNOSIS — C19 Malignant neoplasm of rectosigmoid junction: Secondary | ICD-10-CM | POA: Diagnosis present

## 2021-04-22 DIAGNOSIS — Z510 Encounter for antineoplastic radiation therapy: Secondary | ICD-10-CM | POA: Diagnosis present

## 2021-04-23 ENCOUNTER — Ambulatory Visit
Admission: RE | Admit: 2021-04-23 | Discharge: 2021-04-23 | Disposition: A | Discharge status: Home / Self Care | Payer: 59 | Source: Ambulatory Visit | Attending: Radiation Oncology | Admitting: Radiation Oncology

## 2021-04-23 ENCOUNTER — Other Ambulatory Visit: Payer: Self-pay

## 2021-04-23 DIAGNOSIS — Z510 Encounter for antineoplastic radiation therapy: Secondary | ICD-10-CM | POA: Diagnosis not present

## 2021-04-23 NOTE — Progress Notes (Signed)
Pt here for patient teaching.  Pt given Radiation and You booklet.  Reviewed areas of pertinence such as diarrhea, fatigue, hair loss, sexual and fertility changes, skin changes and urinary and bladder changes . Pt able to give teach back of to pat skin, use unscented/gentle soap, use baby wipes, have Imodium on hand, drink plenty of water and sitz bath,avoid applying anything to skin within 4 hours of treatment. Pt verbalizes understanding of information given and will contact nursing with any questions or concerns.     Http://rtanswers.org/treatmentinformation/whattoexpect/index

## 2021-04-24 ENCOUNTER — Other Ambulatory Visit: Payer: Self-pay

## 2021-04-24 ENCOUNTER — Ambulatory Visit
Admission: RE | Admit: 2021-04-24 | Discharge: 2021-04-24 | Disposition: A | Discharge status: Home / Self Care | Payer: 59 | Source: Ambulatory Visit | Attending: Radiation Oncology | Admitting: Radiation Oncology

## 2021-04-24 ENCOUNTER — Other Ambulatory Visit (HOSPITAL_COMMUNITY): Payer: Self-pay | Admitting: Hematology

## 2021-04-24 DIAGNOSIS — Z510 Encounter for antineoplastic radiation therapy: Secondary | ICD-10-CM | POA: Diagnosis not present

## 2021-04-27 ENCOUNTER — Ambulatory Visit
Admission: RE | Admit: 2021-04-27 | Discharge: 2021-04-27 | Disposition: A | Discharge status: Home / Self Care | Payer: 59 | Source: Ambulatory Visit | Attending: Radiation Oncology | Admitting: Radiation Oncology

## 2021-04-27 DIAGNOSIS — Z510 Encounter for antineoplastic radiation therapy: Secondary | ICD-10-CM | POA: Diagnosis not present

## 2021-04-28 ENCOUNTER — Ambulatory Visit
Admission: RE | Admit: 2021-04-28 | Discharge: 2021-04-28 | Disposition: A | Discharge status: Home / Self Care | Payer: 59 | Source: Ambulatory Visit | Attending: Radiation Oncology | Admitting: Radiation Oncology

## 2021-04-28 ENCOUNTER — Other Ambulatory Visit: Payer: Self-pay

## 2021-04-28 DIAGNOSIS — Z510 Encounter for antineoplastic radiation therapy: Secondary | ICD-10-CM | POA: Diagnosis not present

## 2021-04-29 ENCOUNTER — Ambulatory Visit
Admission: RE | Admit: 2021-04-29 | Discharge: 2021-04-29 | Disposition: A | Discharge status: Home / Self Care | Payer: 59 | Source: Ambulatory Visit | Attending: Radiation Oncology | Admitting: Radiation Oncology

## 2021-04-29 DIAGNOSIS — Z510 Encounter for antineoplastic radiation therapy: Secondary | ICD-10-CM | POA: Diagnosis not present

## 2021-04-30 ENCOUNTER — Other Ambulatory Visit: Payer: Self-pay

## 2021-04-30 ENCOUNTER — Ambulatory Visit
Admission: RE | Admit: 2021-04-30 | Discharge: 2021-04-30 | Disposition: A | Discharge status: Home / Self Care | Payer: 59 | Source: Ambulatory Visit | Attending: Radiation Oncology | Admitting: Radiation Oncology

## 2021-04-30 ENCOUNTER — Encounter (HOSPITAL_COMMUNITY): Payer: Self-pay | Admitting: Hematology

## 2021-04-30 DIAGNOSIS — Z510 Encounter for antineoplastic radiation therapy: Secondary | ICD-10-CM | POA: Diagnosis not present

## 2021-05-01 ENCOUNTER — Ambulatory Visit
Admission: RE | Admit: 2021-05-01 | Discharge: 2021-05-01 | Disposition: A | Discharge status: Home / Self Care | Payer: 59 | Source: Ambulatory Visit | Attending: Radiation Oncology | Admitting: Radiation Oncology

## 2021-05-01 DIAGNOSIS — Z510 Encounter for antineoplastic radiation therapy: Secondary | ICD-10-CM | POA: Diagnosis not present

## 2021-05-04 ENCOUNTER — Ambulatory Visit
Admission: RE | Admit: 2021-05-04 | Discharge: 2021-05-04 | Disposition: A | Discharge status: Home / Self Care | Payer: 59 | Source: Ambulatory Visit | Attending: Radiation Oncology | Admitting: Radiation Oncology

## 2021-05-04 ENCOUNTER — Other Ambulatory Visit: Payer: Self-pay

## 2021-05-04 DIAGNOSIS — Z510 Encounter for antineoplastic radiation therapy: Secondary | ICD-10-CM | POA: Diagnosis not present

## 2021-05-05 ENCOUNTER — Ambulatory Visit
Admission: RE | Admit: 2021-05-05 | Discharge: 2021-05-05 | Disposition: A | Discharge status: Home / Self Care | Payer: 59 | Source: Ambulatory Visit | Attending: Radiation Oncology | Admitting: Radiation Oncology

## 2021-05-05 DIAGNOSIS — Z510 Encounter for antineoplastic radiation therapy: Secondary | ICD-10-CM | POA: Diagnosis not present

## 2021-05-06 ENCOUNTER — Ambulatory Visit
Admission: RE | Admit: 2021-05-06 | Discharge: 2021-05-06 | Disposition: A | Discharge status: Home / Self Care | Payer: 59 | Source: Ambulatory Visit | Attending: Radiation Oncology | Admitting: Radiation Oncology

## 2021-05-06 DIAGNOSIS — Z510 Encounter for antineoplastic radiation therapy: Secondary | ICD-10-CM | POA: Diagnosis not present

## 2021-05-07 ENCOUNTER — Other Ambulatory Visit: Payer: Self-pay

## 2021-05-07 ENCOUNTER — Ambulatory Visit
Admission: RE | Admit: 2021-05-07 | Discharge: 2021-05-07 | Disposition: A | Discharge status: Home / Self Care | Payer: 59 | Source: Ambulatory Visit | Attending: Radiation Oncology | Admitting: Radiation Oncology

## 2021-05-07 ENCOUNTER — Ambulatory Visit (INDEPENDENT_AMBULATORY_CARE_PROVIDER_SITE_OTHER): Payer: 59 | Admitting: Gastroenterology

## 2021-05-07 DIAGNOSIS — Z510 Encounter for antineoplastic radiation therapy: Secondary | ICD-10-CM | POA: Diagnosis not present

## 2021-05-08 ENCOUNTER — Encounter (HOSPITAL_COMMUNITY): Payer: Self-pay

## 2021-05-08 ENCOUNTER — Ambulatory Visit
Admission: RE | Admit: 2021-05-08 | Discharge: 2021-05-08 | Disposition: A | Discharge status: Home / Self Care | Payer: 59 | Source: Ambulatory Visit | Attending: Radiation Oncology | Admitting: Radiation Oncology

## 2021-05-08 ENCOUNTER — Encounter (INDEPENDENT_AMBULATORY_CARE_PROVIDER_SITE_OTHER): Payer: Self-pay | Admitting: Gastroenterology

## 2021-05-08 DIAGNOSIS — Z510 Encounter for antineoplastic radiation therapy: Secondary | ICD-10-CM | POA: Diagnosis not present

## 2021-05-08 NOTE — Progress Notes (Signed)
Patient called today reporting red, blistered rash on arms. Dr. Delton Coombes made aware and advised patient to stop Xeloda immediately, utilize hydrocortisone cream on the rash and go to the ER if the rash spreads her her trunk. Patient then reports that the rash is on her chest, advised patient to report to ED and she verbalized understanding

## 2021-05-11 ENCOUNTER — Ambulatory Visit
Admission: RE | Admit: 2021-05-11 | Discharge: 2021-05-11 | Disposition: A | Discharge status: Home / Self Care | Payer: 59 | Source: Ambulatory Visit | Attending: Radiation Oncology | Admitting: Radiation Oncology

## 2021-05-11 ENCOUNTER — Other Ambulatory Visit: Payer: Self-pay

## 2021-05-11 DIAGNOSIS — Z510 Encounter for antineoplastic radiation therapy: Secondary | ICD-10-CM | POA: Diagnosis not present

## 2021-05-12 ENCOUNTER — Encounter (HOSPITAL_COMMUNITY): Payer: Self-pay | Admitting: Hematology and Oncology

## 2021-05-12 ENCOUNTER — Ambulatory Visit
Admission: RE | Admit: 2021-05-12 | Discharge: 2021-05-12 | Disposition: A | Discharge status: Home / Self Care | Payer: 59 | Source: Ambulatory Visit | Attending: Radiation Oncology | Admitting: Radiation Oncology

## 2021-05-12 ENCOUNTER — Inpatient Hospital Stay (HOSPITAL_COMMUNITY): Payer: 59 | Attending: Hematology | Admitting: Hematology and Oncology

## 2021-05-12 ENCOUNTER — Ambulatory Visit (HOSPITAL_COMMUNITY): Payer: 59 | Admitting: Hematology

## 2021-05-12 VITALS — BP 124/84 | HR 88 | Temp 97.0°F | Resp 18 | Wt 136.2 lb

## 2021-05-12 DIAGNOSIS — C19 Malignant neoplasm of rectosigmoid junction: Secondary | ICD-10-CM | POA: Insufficient documentation

## 2021-05-12 DIAGNOSIS — C20 Malignant neoplasm of rectum: Secondary | ICD-10-CM

## 2021-05-12 DIAGNOSIS — C187 Malignant neoplasm of sigmoid colon: Secondary | ICD-10-CM

## 2021-05-12 DIAGNOSIS — G893 Neoplasm related pain (acute) (chronic): Secondary | ICD-10-CM | POA: Insufficient documentation

## 2021-05-12 DIAGNOSIS — G629 Polyneuropathy, unspecified: Secondary | ICD-10-CM | POA: Insufficient documentation

## 2021-05-12 DIAGNOSIS — R21 Rash and other nonspecific skin eruption: Secondary | ICD-10-CM | POA: Insufficient documentation

## 2021-05-12 DIAGNOSIS — Z510 Encounter for antineoplastic radiation therapy: Secondary | ICD-10-CM | POA: Insufficient documentation

## 2021-05-12 DIAGNOSIS — Z79899 Other long term (current) drug therapy: Secondary | ICD-10-CM | POA: Insufficient documentation

## 2021-05-12 MED ORDER — HYDROCORTISONE 0.5 % EX CREA: 1.0000 "application " | TOPICAL_CREAM | Freq: Two times a day (BID) | CUTANEOUS | 0 refills | Status: DC

## 2021-05-12 NOTE — Progress Notes (Signed)
Pt is taking xeloda as prescribed.  She has a rash on her chest and arms that I have notified Dr. Lorenso Courier about; however, the pt has been on xeloda for weeks before the rash started.

## 2021-05-12 NOTE — Progress Notes (Signed)
[place], [place] [address]   CLINIC:  Medical Oncology/Hematology  PCP:  Lindell Spar, MD [address] / [place] [address] [phone]   REASON FOR VISIT:  Follow-up for sigmoid adenocarcinoma  PRIOR THERAPY: none  NGS Results: not done  CURRENT THERAPY: Neoadjuvant Chemoradiation w/ Xeloda.   BRIEF ONCOLOGIC HISTORY:  Oncology History  Rectal cancer ([place])  04/08/2021 Initial Diagnosis   Rectal cancer ([place])   04/20/2021 -  Chemotherapy    Patient is on Treatment Plan: COLORECTAL CAPECITABINE + XRT        CANCER STAGING: Cancer Staging Rectal cancer [facility]) Staging form: Colon and Rectum, AJCC 8th Edition - Clinical stage from 04/08/2021: Stage IIIB (cT3, cN1b, cM0) - Unsigned  INTERVAL HISTORY:  Alexandra Bright, a 55 y.o. female, returns for routine follow-up of her sigmoid adenocarcinoma. Alexandra Bright was last seen on 03/16/2021.   On exam today Alexandra Bright reports that she has developed a rash over her upper extremities and chest.  Interestingly this only affects the sun exposed areas from the elbow down and a V shape below the neck.  There is no involvement of the back or lower extremities.  She reports this rash is quite itchy and erythematous.  It cropped up about 2 weeks ago and after discontinuation of Xeloda has modestly improved.  She reports that she does have some baseline diarrhea and occasionally passes mucus in her stool.  She otherwise denies any fevers, chills, sweats, nausea, vomiting or constipation.  A full 10 point ROS is listed below.  REVIEW OF SYSTEMS:  Review of Systems  Constitutional:  Positive for fatigue (50%). Negative for appetite change.  Gastrointestinal:  Positive for abdominal pain (7/10), constipation, diarrhea and nausea.  Neurological:  Positive for dizziness, headaches and numbness (hand).  Psychiatric/Behavioral:  The patient is nervous/anxious.   All other systems reviewed and are  negative.  PAST MEDICAL/SURGICAL HISTORY:  Past Medical History:  Diagnosis Date   Anxiety    Colon cancer ([place]) 01/2021   Constipation    Hypertension    Past Surgical History:  Procedure Laterality Date   BIOPSY  02/18/2021   Procedure: BIOPSY;  Surgeon: Harvel Quale, MD;  [facility];  Service: Gastroenterology;;   FACIAL FRACTURE SURGERY     from [place]  02/18/2021   Procedure: FLEXIBLE SIGMOIDOSCOPY;  Surgeon: Harvel Quale, MD;  [facility];  Service: Gastroenterology;;   POLYPECTOMY  02/18/2021   Procedure: POLYPECTOMY INTESTINAL;  Surgeon: Harvel Quale, MD;  [facility];  Service: Gastroenterology;;   SUBMUCOSAL TATTOO INJECTION  02/18/2021   Procedure: SUBMUCOSAL TATTOO INJECTION;  Surgeon: Harvel Quale, MD;  [facility];  Service: Gastroenterology;;    SOCIAL HISTORY:  Social History   Socioeconomic History   Marital status: Married    Spouse name: Not on file   Number of children: Not on file   Years of education: Not on file   Highest education level: Not on file  Occupational History   Not on file  Tobacco Use   Smoking status: Former    Packs/day: 0.50    Years: 40.00    Pack years: 20.00    Types: Cigarettes   Smokeless tobacco: Never  Vaping Use   Vaping Use: Never used  Substance and Sexual Activity   Alcohol use: Not Currently   Drug use: Never   Sexual activity: Not  Currently  Other Topics Concern   Not on file  Social History Narrative   Not on file   Social Determinants of Health   Financial Resource Strain: Low Risk    Difficulty of Paying Living Expenses: Not hard at all  Food Insecurity: No Food Insecurity   Worried About Charity fundraiser in the Last Year: Never true   [place] in the Last Year: Never true  Transportation Needs: No Transportation Needs   Lack of Transportation (Medical): No   Lack of  Transportation (Non-Medical): No  Physical Activity: Insufficiently Active   Days of Exercise per Week: 3 days   Minutes of Exercise per Session: 20 min  Stress: No Stress Concern Present   Feeling of Stress : Only a little  Social Connections: Engineer, building services of Communication with Friends and Family: More than three times a week   Frequency of Social Gatherings with Friends and Family: More than three times a week   Attends Religious Services: More than 4 times per year   Active Member of [employer] or Organizations: Yes   Attends Music therapist: More than 4 times per year   Marital Status: Married  Human resources officer Violence: Not At Risk   Fear of Current or Ex-Partner: No   Emotionally Abused: No   Physically Abused: No   Sexually Abused: No    FAMILY HISTORY:  Family History  Problem Relation Age of Onset   Diabetes Sister    Cancer Maternal Aunt    Ovarian cancer Cousin    Ovarian cancer Paternal Aunt    Prostate cancer Cousin     CURRENT MEDICATIONS:  Current Outpatient Medications  Medication Sig Dispense Refill   Melatonin 10 MG TABS Take 3 tablets by mouth at bedtime.     buPROPion (WELLBUTRIN SR) 200 MG 12 hr tablet Take 1 tablet (200 mg total) by mouth 2 (two) times daily. 60 tablet 5   capecitabine (XELODA) 500 MG tablet Take 3 tablets (1,500 mg total) by mouth 2 (two) times daily after a meal. Take Monday through Friday. Take only on days of radiation. 168 tablet 0   dicyclomine (BENTYL) 10 MG capsule Take 10 mg by mouth 2 (two) times daily as needed (stomach spasms).     gabapentin (NEURONTIN) 100 MG capsule Take 1 capsule (100 mg total) by mouth 2 (two) times daily. (Patient taking differently: Take 100 mg by mouth in the morning.) 60 capsule 5   loperamide (IMODIUM A-D) 2 MG tablet Take 2 mg by mouth 4 (four) times daily as needed for diarrhea or loose stools.     losartan (COZAAR) 50 MG tablet Take 1 tablet (50 mg total) by mouth daily.  (Patient taking differently: Take 50 mg by mouth in the morning.) 90 tablet 1   Multiple Vitamins-Minerals (MULTIVITAMIN WITH MINERALS) tablet Take 1 tablet by mouth in the morning. Women's Multivitamin     neomycin (MYCIFRADIN) 500 MG tablet Take 1,000 mg by mouth 3 (three) times daily. (Patient not taking: Reported on 05/12/2021)     prochlorperazine (COMPAZINE) 10 MG tablet Take 1 tablet (10 mg total) by mouth every 6 (six) hours as needed for nausea or vomiting. 120 tablet 3   sennosides-docusate sodium (SENOKOT-S) 8.6-50 MG tablet Take 1 tablet by mouth in the morning.     traMADol (ULTRAM) 50 MG tablet Take 1 tablet (50 mg total) by mouth 2 (two) times daily as needed. 60 tablet 0  No current facility-administered medications for this visit.    ALLERGIES:  No Known Allergies  PHYSICAL EXAM:  Performance status (ECOG): 1 - Symptomatic but completely ambulatory  Vitals:   05/12/21 1211  BP: 124/84  Pulse: 88  Resp: 18  Temp: (!) 97 F (36.1 C)  SpO2: 98%   Wt Readings from Last 3 Encounters:  05/12/21 136 lb 3.2 oz (61.8 kg)  04/13/21 138 lb 1.6 oz (62.6 kg)  04/08/21 138 lb 3.2 oz (62.7 kg)   Physical Exam Vitals reviewed.  Constitutional:      Appearance: Normal appearance.  Cardiovascular:     Rate and Rhythm: Normal rate and regular rhythm.     Pulses: Normal pulses.     Heart sounds: Normal heart sounds.  Pulmonary:     Effort: Pulmonary effort is normal.     Breath sounds: Normal breath sounds.  Abdominal:     Palpations: Abdomen is soft. There is no hepatomegaly, splenomegaly or mass.     Tenderness: There is abdominal tenderness (mild).  Neurological:     General: No focal deficit present.     Mental Status: She is alert and oriented to person, place, and time.  Psychiatric:        Mood and Affect: Mood normal.        Behavior: Behavior normal.     LABORATORY DATA:  I have reviewed the labs as listed.  CBC Latest Ref Rng & Units 02/05/2021 11/27/2020   WBC 3.8 - 10.8 Thousand/uL 11.5(H) 13.9(H)  Hemoglobin 11.7 - 15.5 g/dL 13.7 13.0  Hematocrit 35.0 - 45.0 % 41.9 39.2  Platelets 140 - 400 Thousand/uL 289 345   CMP Latest Ref Rng & Units 03/06/2021 11/27/2020  Glucose 65 - 99 mg/dL - 99  BUN 6 - 24 mg/dL - 14  Creatinine 0.44 - 1.00 mg/dL 0.90 0.89  Sodium 134 - 144 mmol/L - 140  Potassium 3.5 - 5.2 mmol/L - 4.6  Chloride 96 - 106 mmol/L - 102  CO2 20 - 29 mmol/L - 19(L)  Calcium 8.7 - 10.2 mg/dL - 9.9  Total Protein 6.0 - 8.5 g/dL - 7.5  Total Bilirubin 0.0 - 1.2 mg/dL - <0.2  Alkaline Phos 44 - 121 IU/L - 102  AST 0 - 40 IU/L - 20  ALT 0 - 32 IU/L - 25    DIAGNOSTIC IMAGING:  I have independently reviewed the scans and discussed with the patient. No results found.    ASSESSMENT:  1.  Rectosigmoid sigmoid adenocarcinoma: - Presentation with the bleeding per rectum for the last 5 to 6 months and constipation. - Colonoscopy on 02/18/2021 showed ulcerated partially obstructing mass found from 12-17 cm from the anal verge, circumferential measuring 5 cm in length. - Biopsy consistent with adenocarcinoma. - CT CAP on 03/06/2021 with circumferential wall thickening, hyperenhancement and fat stranding about the rectosigmoid junction, mass measuring approximately 6 cm in length.  No evidence of lymphadenopathy or metastatic disease. - CEA on 02/19/2021 was 1.8.   2.  Social/family history: - She worked as a Secretary/administrator and recently stopped working.  She quit smoking in October 2021, smoked 1 pack/day for 40 years. - Maternal aunt had lung cancer and was a non-smoker.  Paternal aunt had cancer, type not known to the patient.  PLAN:  1.  Rectosigmoid sigmoid adenocarcinoma: - We discussed MRI of the pelvis report.  Initially it was thought to be in the distal sigmoid. - The report was amended to reflect that  she has at least T3c/T4 high rectal cancer and Nstage-as N1.  Mesorectal and superior rectal lymph nodes largest measuring 5 mm to  6 mm. - patient connected with radiation oncology, currently undergoing neoadjuvant chemoradiation with Xeloda. --started radiation on 04/22/21.  2. Upper Extremity/Chest Rash. Consistent with Photosensitivity from Xeloda -- Findings at this time are most consistent with rash in sun exposed areas while on Xeloda.  This is an uncommon side effect from the medication. --Recommend administering hydrocortisone cream to these areas and keeping them well covered in sunlight. --Okay to restart Xeloda therapy. --Recommend clinic visit with Dr. Raliegh Ip next week in order to assure rash is improving. --Strict return precautions for any worsening rash, fevers, chills, sweats or other infectious symptoms.   3.  Rectal pain: - Continue tramadol 50 mg once daily as it is helping.   4.  Peripheral neuropathy: - She has on and off tingling in the fingertips and feet since October 2021. - Continue gabapentin 100 mg twice daily.   Orders placed this encounter:  No orders of the defined types were placed in this encounter.  Total time spent is 30 minutes with more than 50% of the time spent face-to-face discussing scan results, treatment plan, counseling and coordination of care.  Ledell Peoples, MD Department of Hematology/Oncology [place] at [facility] Phone: [phone] Pager: [phone] Email: [email or web address]

## 2021-05-13 ENCOUNTER — Ambulatory Visit
Admission: RE | Admit: 2021-05-13 | Discharge: 2021-05-13 | Disposition: A | Discharge status: Home / Self Care | Payer: 59 | Source: Ambulatory Visit | Attending: Radiation Oncology | Admitting: Radiation Oncology

## 2021-05-13 ENCOUNTER — Other Ambulatory Visit: Payer: Self-pay

## 2021-05-13 ENCOUNTER — Other Ambulatory Visit (HOSPITAL_COMMUNITY): Payer: Self-pay

## 2021-05-13 DIAGNOSIS — C20 Malignant neoplasm of rectum: Secondary | ICD-10-CM

## 2021-05-13 DIAGNOSIS — C187 Malignant neoplasm of sigmoid colon: Secondary | ICD-10-CM

## 2021-05-13 DIAGNOSIS — Z510 Encounter for antineoplastic radiation therapy: Secondary | ICD-10-CM | POA: Diagnosis not present

## 2021-05-14 ENCOUNTER — Ambulatory Visit
Admission: RE | Admit: 2021-05-14 | Discharge: 2021-05-14 | Disposition: A | Discharge status: Home / Self Care | Payer: 59 | Source: Ambulatory Visit | Attending: Radiation Oncology | Admitting: Radiation Oncology

## 2021-05-14 DIAGNOSIS — Z510 Encounter for antineoplastic radiation therapy: Secondary | ICD-10-CM | POA: Diagnosis not present

## 2021-05-15 ENCOUNTER — Ambulatory Visit
Admission: RE | Admit: 2021-05-15 | Discharge: 2021-05-15 | Disposition: A | Discharge status: Home / Self Care | Payer: 59 | Source: Ambulatory Visit | Attending: Radiation Oncology | Admitting: Radiation Oncology

## 2021-05-15 ENCOUNTER — Other Ambulatory Visit: Payer: Self-pay

## 2021-05-15 DIAGNOSIS — Z510 Encounter for antineoplastic radiation therapy: Secondary | ICD-10-CM | POA: Diagnosis not present

## 2021-05-18 ENCOUNTER — Ambulatory Visit
Admission: RE | Admit: 2021-05-18 | Discharge: 2021-05-18 | Disposition: A | Discharge status: Home / Self Care | Payer: 59 | Source: Ambulatory Visit | Attending: Radiation Oncology | Admitting: Radiation Oncology

## 2021-05-18 ENCOUNTER — Other Ambulatory Visit: Payer: Self-pay

## 2021-05-18 DIAGNOSIS — Z510 Encounter for antineoplastic radiation therapy: Secondary | ICD-10-CM | POA: Diagnosis not present

## 2021-05-19 ENCOUNTER — Ambulatory Visit
Admission: RE | Admit: 2021-05-19 | Discharge: 2021-05-19 | Disposition: A | Discharge status: Home / Self Care | Payer: 59 | Source: Ambulatory Visit | Attending: Radiation Oncology | Admitting: Radiation Oncology

## 2021-05-19 DIAGNOSIS — Z510 Encounter for antineoplastic radiation therapy: Secondary | ICD-10-CM | POA: Diagnosis not present

## 2021-05-20 ENCOUNTER — Other Ambulatory Visit: Payer: Self-pay

## 2021-05-20 ENCOUNTER — Ambulatory Visit
Admission: RE | Admit: 2021-05-20 | Discharge: 2021-05-20 | Disposition: A | Discharge status: Home / Self Care | Payer: 59 | Source: Ambulatory Visit | Attending: Radiation Oncology | Admitting: Radiation Oncology

## 2021-05-20 DIAGNOSIS — Z510 Encounter for antineoplastic radiation therapy: Secondary | ICD-10-CM | POA: Diagnosis not present

## 2021-05-21 ENCOUNTER — Ambulatory Visit (HOSPITAL_COMMUNITY): Payer: 59 | Admitting: Hematology and Oncology

## 2021-05-21 ENCOUNTER — Ambulatory Visit
Admission: RE | Admit: 2021-05-21 | Discharge: 2021-05-21 | Disposition: A | Discharge status: Home / Self Care | Payer: 59 | Source: Ambulatory Visit | Attending: Radiation Oncology | Admitting: Radiation Oncology

## 2021-05-21 ENCOUNTER — Other Ambulatory Visit (HOSPITAL_COMMUNITY): Payer: 59

## 2021-05-21 DIAGNOSIS — Z510 Encounter for antineoplastic radiation therapy: Secondary | ICD-10-CM | POA: Diagnosis not present

## 2021-05-22 ENCOUNTER — Other Ambulatory Visit: Payer: Self-pay

## 2021-05-22 ENCOUNTER — Ambulatory Visit
Admission: RE | Admit: 2021-05-22 | Discharge: 2021-05-22 | Disposition: A | Discharge status: Home / Self Care | Payer: 59 | Source: Ambulatory Visit | Attending: Radiation Oncology | Admitting: Radiation Oncology

## 2021-05-22 ENCOUNTER — Other Ambulatory Visit: Payer: Self-pay | Admitting: Radiation Oncology

## 2021-05-22 DIAGNOSIS — C19 Malignant neoplasm of rectosigmoid junction: Secondary | ICD-10-CM | POA: Diagnosis present

## 2021-05-22 DIAGNOSIS — R197 Diarrhea, unspecified: Secondary | ICD-10-CM | POA: Diagnosis not present

## 2021-05-22 DIAGNOSIS — Z79899 Other long term (current) drug therapy: Secondary | ICD-10-CM | POA: Diagnosis not present

## 2021-05-22 DIAGNOSIS — Z9221 Personal history of antineoplastic chemotherapy: Secondary | ICD-10-CM | POA: Diagnosis not present

## 2021-05-22 DIAGNOSIS — C20 Malignant neoplasm of rectum: Secondary | ICD-10-CM | POA: Insufficient documentation

## 2021-05-22 DIAGNOSIS — Z510 Encounter for antineoplastic radiation therapy: Secondary | ICD-10-CM | POA: Diagnosis not present

## 2021-05-22 DIAGNOSIS — G893 Neoplasm related pain (acute) (chronic): Secondary | ICD-10-CM | POA: Diagnosis not present

## 2021-05-22 DIAGNOSIS — R748 Abnormal levels of other serum enzymes: Secondary | ICD-10-CM | POA: Diagnosis not present

## 2021-05-22 MED ORDER — OXYCODONE-ACETAMINOPHEN 5-325 MG PO TABS: 1.0000 | ORAL_TABLET | ORAL | 0 refills | Status: DC | PRN

## 2021-05-23 ENCOUNTER — Other Ambulatory Visit: Payer: Self-pay | Admitting: Internal Medicine

## 2021-05-23 DIAGNOSIS — I10 Essential (primary) hypertension: Secondary | ICD-10-CM

## 2021-05-26 ENCOUNTER — Inpatient Hospital Stay (HOSPITAL_COMMUNITY): Payer: 59 | Attending: Hematology and Oncology | Admitting: Hematology and Oncology

## 2021-05-26 ENCOUNTER — Ambulatory Visit
Admission: RE | Admit: 2021-05-26 | Discharge: 2021-05-26 | Disposition: A | Discharge status: Home / Self Care | Payer: 59 | Source: Ambulatory Visit | Attending: Radiation Oncology | Admitting: Radiation Oncology

## 2021-05-26 ENCOUNTER — Other Ambulatory Visit: Payer: Self-pay

## 2021-05-26 ENCOUNTER — Inpatient Hospital Stay (HOSPITAL_COMMUNITY): Payer: 59

## 2021-05-26 ENCOUNTER — Encounter: Payer: Self-pay | Admitting: Radiation Oncology

## 2021-05-26 VITALS — BP 127/76 | HR 102 | Temp 97.1°F | Resp 18 | Wt 135.2 lb

## 2021-05-26 DIAGNOSIS — C187 Malignant neoplasm of sigmoid colon: Secondary | ICD-10-CM

## 2021-05-26 DIAGNOSIS — C19 Malignant neoplasm of rectosigmoid junction: Secondary | ICD-10-CM | POA: Insufficient documentation

## 2021-05-26 DIAGNOSIS — Z510 Encounter for antineoplastic radiation therapy: Secondary | ICD-10-CM | POA: Diagnosis not present

## 2021-05-26 DIAGNOSIS — C20 Malignant neoplasm of rectum: Secondary | ICD-10-CM

## 2021-05-26 DIAGNOSIS — R197 Diarrhea, unspecified: Secondary | ICD-10-CM | POA: Insufficient documentation

## 2021-05-26 DIAGNOSIS — R748 Abnormal levels of other serum enzymes: Secondary | ICD-10-CM | POA: Insufficient documentation

## 2021-05-26 DIAGNOSIS — Z79899 Other long term (current) drug therapy: Secondary | ICD-10-CM | POA: Insufficient documentation

## 2021-05-26 DIAGNOSIS — G893 Neoplasm related pain (acute) (chronic): Secondary | ICD-10-CM | POA: Insufficient documentation

## 2021-05-26 DIAGNOSIS — Z9221 Personal history of antineoplastic chemotherapy: Secondary | ICD-10-CM | POA: Insufficient documentation

## 2021-05-26 LAB — COMPREHENSIVE METABOLIC PANEL
ALT: 226 U/L — ABNORMAL HIGH (ref 0–44)
AST: 117 U/L — ABNORMAL HIGH (ref 15–41)
Albumin: 3.7 g/dL (ref 3.5–5.0)
Alkaline Phosphatase: 81 U/L (ref 38–126)
Anion gap: 10 (ref 5–15)
BUN: 10 mg/dL (ref 6–20)
CO2: 26 mmol/L (ref 22–32)
Calcium: 9.2 mg/dL (ref 8.9–10.3)
Chloride: 102 mmol/L (ref 98–111)
Creatinine, Ser: 0.9 mg/dL (ref 0.44–1.00)
GFR, Estimated: >60 mL/min (ref >60)
Glucose, Bld: 129 mg/dL — ABNORMAL HIGH (ref 70–99)
Potassium: 3.6 mmol/L (ref 3.5–5.1)
Sodium: 138 mmol/L (ref 135–145)
Total Bilirubin: 1 mg/dL (ref 0.3–1.2)
Total Protein: 6.5 g/dL (ref 6.5–8.1)

## 2021-05-26 LAB — MAGNESIUM: Magnesium: 1.9 mg/dL (ref 1.7–2.4)

## 2021-05-26 LAB — CBC WITH DIFFERENTIAL/PLATELET
Abs Immature Granulocytes: 0.05 10*3/uL (ref 0.00–0.07)
Basophils Absolute: 0 10*3/uL (ref 0.0–0.1)
Basophils Relative: 0 %
Eosinophils Absolute: 0.4 10*3/uL (ref 0.0–0.5)
Eosinophils Relative: 7 %
HCT: 32.5 % — ABNORMAL LOW (ref 36.0–46.0)
Hemoglobin: 11 g/dL — ABNORMAL LOW (ref 12.0–15.0)
Immature Granulocytes: 1 %
Lymphocytes Relative: 8 %
Lymphs Abs: 0.5 10*3/uL — ABNORMAL LOW (ref 0.7–4.0)
MCH: 32.4 pg (ref 26.0–34.0)
MCHC: 33.8 g/dL (ref 30.0–36.0)
MCV: 95.6 fL (ref 80.0–100.0)
Monocytes Absolute: 0.8 10*3/uL (ref 0.1–1.0)
Monocytes Relative: 12 %
Neutro Abs: 4.4 10*3/uL (ref 1.7–7.7)
Neutrophils Relative %: 72 %
Platelets: 266 10*3/uL (ref 150–400)
RBC: 3.4 MIL/uL — ABNORMAL LOW (ref 3.87–5.11)
RDW: 15.5 % (ref 11.5–15.5)
WBC: 6.1 10*3/uL (ref 4.0–10.5)
nRBC: 0 % (ref 0.0–0.2)

## 2021-05-26 NOTE — Progress Notes (Signed)
[name], [place] [address]   CLINIC:  Medical Oncology/Hematology  PCP:  Lindell Spar, MD [address] / [place] [address] [phone]   REASON FOR VISIT:  Follow-up for sigmoid adenocarcinoma  PRIOR THERAPY: none  NGS Results: not done  CURRENT THERAPY: Neoadjuvant Chemoradiation w/ Xeloda.   BRIEF ONCOLOGIC HISTORY:  Oncology History  Rectal cancer (Alexandra Bright)  04/08/2021 Initial Diagnosis   Rectal cancer (Alexandra Bright)    04/20/2021 -  Chemotherapy    Patient is on Treatment Plan: COLORECTAL CAPECITABINE + XRT         CANCER STAGING: Cancer Staging Rectal cancer [facility]) Staging form: Colon and Rectum, AJCC 8th Edition - Clinical stage from 04/08/2021: Stage IIIB (cT3, cN1b, cM0) - Unsigned  INTERVAL HISTORY:  [name], a 55 y.o. female, returns for routine follow-up of her sigmoid adenocarcinoma. [name] was last seen on 05/12/2021.   On exam today Mrs. Bright reports she received her last dose of radiation next Monday.  She has been doing her best to keep out of the sun in order to prevent worsening of her skin rash.  She notes that she is feeling a lot of burning in her rectum.  She notes that she has decreased her intake of food due to fear of the pain of going to the bathroom.  She does endorse having good levels of energy.  She reports that she does have some baseline diarrhea and occasionally passes mucus in her stool.  She otherwise denies any fevers, chills, sweats, nausea, vomiting or constipation.  A full 10 point ROS is listed below.  REVIEW OF SYSTEMS:  Review of Systems  Constitutional:  Positive for fatigue (50%). Negative for appetite change.  Gastrointestinal:  Positive for abdominal pain (7/10), constipation, diarrhea and nausea.  Neurological:  Positive for dizziness, headaches and numbness (hand).  Psychiatric/Behavioral:  The patient is nervous/anxious.   All other systems reviewed and are negative.  PAST  MEDICAL/SURGICAL HISTORY:  Past Medical History:  Diagnosis Date   Anxiety    Colon cancer (Alexandra Bright) 01/2021   Constipation    Hypertension    Past Surgical History:  Procedure Laterality Date   BIOPSY  02/18/2021   Procedure: BIOPSY;  Surgeon: Harvel Quale, MD;  [facility];  Service: Gastroenterology;;   FACIAL FRACTURE SURGERY     from [place]  02/18/2021   Procedure: FLEXIBLE SIGMOIDOSCOPY;  Surgeon: Harvel Quale, MD;  [facility];  Service: Gastroenterology;;   POLYPECTOMY  02/18/2021   Procedure: POLYPECTOMY INTESTINAL;  Surgeon: Harvel Quale, MD;  [facility];  Service: Gastroenterology;;   SUBMUCOSAL TATTOO INJECTION  02/18/2021   Procedure: SUBMUCOSAL TATTOO INJECTION;  Surgeon: Harvel Quale, MD;  [facility];  Service: Gastroenterology;;    SOCIAL HISTORY:  Social History   Socioeconomic History   Marital status: Married    Spouse name: Not on file   Number of children: Not on file   Years of education: Not on file   Highest education level: Not on file  Occupational History   Not on file  Tobacco Use   Smoking status: Former    Packs/day: 0.50    Years: 40.00    Pack years: 20.00    Types: Cigarettes   Smokeless tobacco: Never  Vaping Use   Vaping Use: Never used  Substance and Sexual Activity   Alcohol use: Not Currently  Drug use: Never   Sexual activity: Not Currently  Other Topics Concern   Not on file  Social History Narrative   Not on file   Social Determinants of Health   Financial Resource Strain: Low Risk    Difficulty of Paying Living Expenses: Not hard at all  Food Insecurity: No Food Insecurity   Worried About Running Out of Food in the Last Year: Never true   [place] in the Last Year: Never true  Transportation Needs: No Transportation Needs   Lack of Transportation (Medical): No   Lack of Transportation  (Non-Medical): No  Physical Activity: Insufficiently Active   Days of Exercise per Week: 3 days   Minutes of Exercise per Session: 20 min  Stress: No Stress Concern Present   Feeling of Stress : Only a little  Social Connections: Engineer, building services of Communication with Friends and Family: More than three times a week   Frequency of Social Gatherings with Friends and Family: More than three times a week   Attends Religious Services: More than 4 times per year   Active Member of [employer] or Organizations: Yes   Attends Music therapist: More than 4 times per year   Marital Status: Married  Human resources officer Violence: Not At Risk   Fear of Current or Ex-Partner: No   Emotionally Abused: No   Physically Abused: No   Sexually Abused: No    FAMILY HISTORY:  Family History  Problem Relation Age of Onset   Diabetes Sister    Cancer Maternal Aunt    Ovarian cancer Cousin    Ovarian cancer Paternal Aunt    Prostate cancer Cousin     CURRENT MEDICATIONS:  Current Outpatient Medications  Medication Sig Dispense Refill   buPROPion (WELLBUTRIN SR) 200 MG 12 hr tablet Take 1 tablet (200 mg total) by mouth 2 (two) times daily. 60 tablet 5   gabapentin (NEURONTIN) 100 MG capsule Take 1 capsule (100 mg total) by mouth 2 (two) times daily. (Patient taking differently: Take 100 mg by mouth in the morning.) 60 capsule 5   hydrocortisone cream 0.5 % Apply 1 application topically 2 (two) times daily. 30 g 0   Melatonin 10 MG TABS Take 3 tablets by mouth at bedtime.     Multiple Vitamins-Minerals (MULTIVITAMIN WITH MINERALS) tablet Take 1 tablet by mouth in the morning. Women's Multivitamin     neomycin (MYCIFRADIN) 500 MG tablet Take 1,000 mg by mouth 3 (three) times daily.     sennosides-docusate sodium (SENOKOT-S) 8.6-50 MG tablet Take 1 tablet by mouth in the morning.     capecitabine (XELODA) 500 MG tablet Take 3 tablets (1,500 mg total) by mouth 2 (two) times daily after  a meal. Take Monday through Friday. Take only on days of radiation. 168 tablet 0   dicyclomine (BENTYL) 10 MG capsule Take 10 mg by mouth 2 (two) times daily as needed (stomach spasms). (Patient not taking: Reported on 05/26/2021)     loperamide (IMODIUM A-D) 2 MG tablet Take 2 mg by mouth 4 (four) times daily as needed for diarrhea or loose stools. (Patient not taking: Reported on 05/26/2021)     losartan (COZAAR) 50 MG tablet Take 1 tablet by mouth once daily (Patient not taking: Reported on 05/26/2021) 90 tablet 0   ondansetron (ZOFRAN) 8 MG tablet Take 1 tablet (8 mg total) by mouth every 8 (eight) hours as needed for nausea or vomiting. 20 tablet 0  oxyCODONE-acetaminophen (PERCOCET/ROXICET) 5-325 MG tablet Take 1-2 tablets by mouth every 4 (four) hours as needed for severe pain (For Cancer Pain). (Patient not taking: Reported on 05/26/2021) 30 tablet 0   prochlorperazine (COMPAZINE) 10 MG tablet Take 1 tablet (10 mg total) by mouth every 6 (six) hours as needed for nausea or vomiting. (Patient not taking: Reported on 05/26/2021) 120 tablet 3   traMADol (ULTRAM) 50 MG tablet Take 1 tablet (50 mg total) by mouth 2 (two) times daily as needed. (Patient not taking: Reported on 05/26/2021) 60 tablet 0   No current facility-administered medications for this visit.    ALLERGIES:  No Known Allergies  PHYSICAL EXAM:  Performance status (ECOG): 1 - Symptomatic but completely ambulatory  Vitals:   05/26/21 1513  BP: 127/76  Pulse: (!) 102  Resp: 18  Temp: (!) 97.1 F (36.2 C)  SpO2: 97%   Wt Readings from Last 3 Encounters:  05/26/21 135 lb 3.2 oz (61.3 kg)  05/12/21 136 lb 3.2 oz (61.8 kg)  04/13/21 138 lb 1.6 oz (62.6 kg)   Physical Exam Vitals reviewed.  Constitutional:      Appearance: Normal appearance.  Cardiovascular:     Rate and Rhythm: Normal rate and regular rhythm.     Pulses: Normal pulses.     Heart sounds: Normal heart sounds.  Pulmonary:     Effort: Pulmonary effort is  normal.     Breath sounds: Normal breath sounds.  Abdominal:     Palpations: Abdomen is soft. There is no hepatomegaly, splenomegaly or mass.     Tenderness: There is abdominal tenderness (mild).  Neurological:     General: No focal deficit present.     Mental Status: She is alert and oriented to person, place, and time.  Psychiatric:        Mood and Affect: Mood normal.        Behavior: Behavior normal.     LABORATORY DATA:  I have reviewed the labs as listed.  CBC Latest Ref Rng & Units 05/26/2021 02/05/2021 11/27/2020  WBC 4.0 - 10.5 K/uL 6.1 11.5(H) 13.9(H)  Hemoglobin 12.0 - 15.0 g/dL 11.0(L) 13.7 13.0  Hematocrit 36.0 - 46.0 % 32.5(L) 41.9 39.2  Platelets 150 - 400 K/uL 266 289 345   CMP Latest Ref Rng & Units 05/26/2021 03/06/2021 11/27/2020  Glucose 70 - 99 mg/dL 129(H) - 99  BUN 6 - 20 mg/dL 10 - 14  Creatinine 0.44 - 1.00 mg/dL 0.90 0.90 0.89  Sodium 135 - 145 mmol/L 138 - 140  Potassium 3.5 - 5.1 mmol/L 3.6 - 4.6  Chloride 98 - 111 mmol/L 102 - 102  CO2 22 - 32 mmol/L 26 - 19(L)  Calcium 8.9 - 10.3 mg/dL 9.2 - 9.9  Total Protein 6.5 - 8.1 g/dL 6.5 - 7.5  Total Bilirubin 0.3 - 1.2 mg/dL 1.0 - <0.2  Alkaline Phos 38 - 126 U/L 81 - 102  AST 15 - 41 U/L 117(H) - 20  ALT 0 - 44 U/L 226(H) - 25    DIAGNOSTIC IMAGING:  I have independently reviewed the scans and discussed with the patient. No results found.    ASSESSMENT:  1.  Rectosigmoid sigmoid adenocarcinoma: - Presentation with the bleeding per rectum for the last 5 to 6 months and constipation. - Colonoscopy on 02/18/2021 showed ulcerated partially obstructing mass found from 12-17 cm from the anal verge, circumferential measuring 5 cm in length. - Biopsy consistent with adenocarcinoma. - CT CAP on 03/06/2021 with circumferential  wall thickening, hyperenhancement and fat stranding about the rectosigmoid junction, mass measuring approximately 6 cm in length.  No evidence of lymphadenopathy or metastatic disease. - CEA on  02/19/2021 was 1.8.   2.  Social/family history: - She worked as a Secretary/administrator and recently stopped working.  She quit smoking in October 2021, smoked 1 pack/day for 40 years. - Maternal aunt had lung cancer and was a non-smoker.  Paternal aunt had cancer, type not known to the patient.  PLAN:  1.  Rectosigmoid sigmoid adenocarcinoma: - We discussed MRI of the pelvis report.  Initially it was thought to be in the distal sigmoid. - The report was amended to reflect that she has at least T3c/T4 high rectal cancer and Nstage-as N1.  Mesorectal and superior rectal lymph nodes largest measuring 5 mm to 6 mm. - patient connected with radiation oncology, currently undergoing neoadjuvant chemoradiation with Xeloda.  Last treatment of radiation to be next Monday. --started radiation on 04/22/21.  2. Upper Extremity/Chest Rash. Consistent with Photosensitivity from Xeloda -- Findings at this time are most consistent with rash in sun exposed areas while on Xeloda.  This is an uncommon side effect from the medication. --Recommend administering hydrocortisone cream to these areas and keeping them well covered in sunlight. --Okay to restart Xeloda therapy. --Strict return precautions for any worsening rash, fevers, chills, sweats or other infectious symptoms.   3.  Rectal pain: - Continue tramadol 50 mg once daily as it is helping.   4.  Peripheral neuropathy: - She has on and off tingling in the fingertips and feet since October 2021. - Continue gabapentin 100 mg twice daily.   Orders placed this encounter:  No orders of the defined types were placed in this encounter.  Total time spent is 30 minutes with more than 50% of the time spent face-to-face discussing scan results, treatment plan, counseling and coordination of care.  Ledell Peoples, MD Department of Hematology/Oncology [place] at [facility] Phone: [phone] Pager: [phone] Email:  [email or web address]

## 2021-05-27 ENCOUNTER — Ambulatory Visit
Admission: RE | Admit: 2021-05-27 | Discharge: 2021-05-27 | Disposition: A | Discharge status: Home / Self Care | Payer: 59 | Source: Ambulatory Visit | Attending: Radiation Oncology | Admitting: Radiation Oncology

## 2021-05-27 ENCOUNTER — Other Ambulatory Visit (HOSPITAL_COMMUNITY): Payer: Self-pay | Admitting: *Deleted

## 2021-05-27 DIAGNOSIS — Z510 Encounter for antineoplastic radiation therapy: Secondary | ICD-10-CM | POA: Diagnosis not present

## 2021-05-27 DIAGNOSIS — C187 Malignant neoplasm of sigmoid colon: Secondary | ICD-10-CM

## 2021-05-27 MED ORDER — CAPECITABINE 500 MG PO TABS: 1500.0000 mg | ORAL_TABLET | Freq: Two times a day (BID) | ORAL | 0 refills | Status: DC

## 2021-05-28 ENCOUNTER — Other Ambulatory Visit: Payer: Self-pay

## 2021-05-28 ENCOUNTER — Ambulatory Visit
Admission: RE | Admit: 2021-05-28 | Discharge: 2021-05-28 | Disposition: A | Discharge status: Home / Self Care | Payer: 59 | Source: Ambulatory Visit | Attending: Radiation Oncology | Admitting: Radiation Oncology

## 2021-05-28 ENCOUNTER — Other Ambulatory Visit (HOSPITAL_COMMUNITY): Payer: Self-pay | Admitting: *Deleted

## 2021-05-28 DIAGNOSIS — Z510 Encounter for antineoplastic radiation therapy: Secondary | ICD-10-CM | POA: Diagnosis not present

## 2021-05-29 ENCOUNTER — Ambulatory Visit
Admission: RE | Admit: 2021-05-29 | Discharge: 2021-05-29 | Disposition: A | Discharge status: Home / Self Care | Payer: 59 | Source: Ambulatory Visit | Attending: Radiation Oncology | Admitting: Radiation Oncology

## 2021-05-29 ENCOUNTER — Other Ambulatory Visit: Payer: Self-pay | Admitting: Radiation Oncology

## 2021-05-29 DIAGNOSIS — Z510 Encounter for antineoplastic radiation therapy: Secondary | ICD-10-CM | POA: Diagnosis not present

## 2021-05-29 MED ORDER — ONDANSETRON HCL 8 MG PO TABS: 8.0000 mg | ORAL_TABLET | Freq: Three times a day (TID) | ORAL | 0 refills | Status: DC | PRN

## 2021-06-01 ENCOUNTER — Ambulatory Visit
Admission: RE | Admit: 2021-06-01 | Discharge: 2021-06-01 | Disposition: A | Discharge status: Home / Self Care | Payer: 59 | Source: Ambulatory Visit | Attending: Radiation Oncology | Admitting: Radiation Oncology

## 2021-06-01 ENCOUNTER — Encounter: Payer: Self-pay | Admitting: Radiation Oncology

## 2021-06-01 ENCOUNTER — Other Ambulatory Visit: Payer: Self-pay

## 2021-06-01 DIAGNOSIS — Z510 Encounter for antineoplastic radiation therapy: Secondary | ICD-10-CM | POA: Diagnosis not present

## 2021-06-02 ENCOUNTER — Encounter (HOSPITAL_COMMUNITY): Payer: Self-pay | Admitting: Hematology

## 2021-06-02 ENCOUNTER — Encounter (HOSPITAL_COMMUNITY): Payer: Self-pay | Admitting: Hematology and Oncology

## 2021-06-04 ENCOUNTER — Encounter (HOSPITAL_COMMUNITY): Payer: Self-pay | Admitting: Hematology

## 2021-06-04 NOTE — Progress Notes (Signed)
  Radiation Oncology         ([phone]) [phone] ________________________________  Name: Alexandra Bright MRN: [id number]  Date: 05/26/2021  DOB: 09-Mar-1966  SIMULATION NOTE   NARRATIVE:  The patient underwent simulation today for ongoing radiation therapy.  The existing CT study set was employed for the purpose of virtual treatment planning.  The target and avoidance structures were reviewed and modified as necessary.  Treatment planning then occurred.  The radiation boost prescription was entered and confirmed.  A total of 4 complex treatment devices were fabricated in the form of multi-leaf collimators to shape radiation around the targets while maximally excluding nearby normal structures. I have requested : Isodose Plan.    PLAN:  This modified radiation beam arrangement is intended to continue the current radiation dose to an additional 5.4 Gy in 3 fractions for a total cumulative dose of 50.4 Gy.    ------------------------------------------------  Jodelle Gross, MD, PhD

## 2021-06-15 ENCOUNTER — Ambulatory Visit: Payer: Self-pay | Admitting: General Surgery

## 2021-06-15 NOTE — H&P (Signed)
REFERRING PHYSICIAN:  Saverio Danker, MD  PROVIDER:  Monico Blitz, MD  MRN: [id number] DOB: 1966/10/23 DATE OF ENCOUNTER: 06/15/2021  Subjective   Chief Complaint: Rectal Cancer     History of Present Illness: Alexandra Bright is a 55 y.o. female who is seen today as an office consultation at the request of Dr. Laural Golden for evaluation of Rectal Cancer .   Patient presented to the office for evaluation of a newly diagnosed rectal cancer.  She states over the past few months, she noted blood in her stools, as well as a change in her bowel habits.  She reports difficulty with constipation her entire life and this is switched to loose stools and diarrhea.  She underwent a flexible sigmoidoscopy.  This was advanced approximately 30 cm from the anal verge.  There was a partially obstructing large mass found in the rectosigmoid area approximately 12-17 cm proximal to the anus.  This was biopsied and tattooed distally.  Biopsies show adenocarcinoma.  CT scan of the chest, abdomen and pelvis showed a mass at the rectosigmoid junction approximally 6 cm in length.  MRI shows a T3c potentially T4 rectal tumor just below the peritoneal reflection small pathologic appearing lymph nodes were noted as well.  Distance to the internal sphincter was approximately 12 to 13 cm.  There is no sign of metastatic disease.  She underwent neoadjuvant chemotherapy and radiation and finished this on 7/11.   Review of Systems: A complete review of systems was obtained from the patient.  I have reviewed this information and discussed as appropriate with the patient.  See HPI as well for other ROS.   Medical History: Past Medical History:  Diagnosis Date   Anxiety    Arthritis    History of cancer     Patient Active Problem List  Diagnosis   Adenocarcinoma of colon (CMS-HCC)   Anxiety   IBS (irritable bowel syndrome)   Depression   Eczema, unspecified   Hyperlipidemia   Hypertension   Idiopathic  peripheral neuropathy   Mucus in stool   Rectal bleeding   Rectal cancer (CMS-HCC)   Vitamin D insufficiency    No past surgical history on file.   No Known Allergies  Current Outpatient Medications on File Prior to Visit  Medication Sig Dispense Refill   buPROPion (WELLBUTRIN SR) 200 MG SR tablet Take 200 mg by mouth 2 (two) times daily     losartan (COZAAR) 50 MG tablet Take 1 tablet by mouth once daily     No current facility-administered medications on file prior to visit.    Family History  Problem Relation Age of Onset   Skin cancer Mother    Stroke Father    Coronary Artery Disease (Blocked arteries around heart) Father    Diabetes Father    Obesity Sister    Diabetes Sister    Colon cancer Other    Breast cancer Other      Social History   Tobacco Use  Smoking Status Never Smoker  Smokeless Tobacco Never Used     Social History   Socioeconomic History   Marital status: Married  Tobacco Use   Smoking status: Never Smoker   Smokeless tobacco: Never Used  Scientific laboratory technician Use: Never used  Substance and Sexual Activity   Alcohol use: Never   Drug use: Never   Sexual activity: Defer    Objective:    Vitals:   06/15/21 1611  Weight: 59  kg (130 lb)  Height: 154.9 cm ('5\' 1"'$ )     Exam Gen: NAD CV: RRR Lungs: CTA Abd: soft, nontender    Labs, Imaging and Diagnostic Testing: MRI reviewed.  Appears to be a proximal rectal mass  Assessment and Plan:  Diagnoses and all orders for this visit:  Rectal cancer (CMS-HCC)    We discussed the need for low anterior resection.  I do not think she will need a diverting ileostomy but we did discuss that this is possible.  I did recommend firefly injection for ureter identification during surgery.  We discussed the surgery in detail again and all questions were answered.  Patient wishes to proceed with surgery.  We will plan on doing this in mid September or early October. We discussed the management  of anal warts. We discussed chemical destruction, immunotherapy, and surgical excision. I discussed the pros and cons of each approach. We discussed the risk and benefits and the expected outcome with chemical destruction with topical agents. I explained that topical agents are generally not effective and has a high recurrence rate. We discussed the use of Aldara ointment. I explained that it has a 30-70% chance at resolving or at least reducing the number of external anal warts. I explained that it is applied 3 times a week at night and left on overnight. I explained that skin irritation is the most common side effect. We then discussed surgical excision and fulguration. I explained how the surgery is performed. I explained that it can be painful however it generally has the highest success rate. We discussed the risk and benefits of surgery including but not limited to bleeding, infection, injury to surrounding structures, need to do a formal anoscopic exam to evaluate for anal canal warts, urinary retention, wart recurrence, and general anesthesia risk. We discussed the typical aftercare.

## 2021-06-15 NOTE — H&P (View-Only) (Signed)
REFERRING PHYSICIAN:  Saverio Danker, MD  PROVIDER:  Monico Blitz, MD  MRN: [id number] DOB: 05-14-1966 DATE OF ENCOUNTER: 06/15/2021  Subjective   Chief Complaint: Rectal Cancer     History of Present Illness: Alexandra Bright is a 55 y.o. female who is seen today as an office consultation at the request of Dr. Laural Golden for evaluation of Rectal Cancer .   Patient presented to the office for evaluation of a newly diagnosed rectal cancer.  She states over the past few months, she noted blood in her stools, as well as a change in her bowel habits.  She reports difficulty with constipation her entire life and this is switched to loose stools and diarrhea.  She underwent a flexible sigmoidoscopy.  This was advanced approximately 30 cm from the anal verge.  There was a partially obstructing large mass found in the rectosigmoid area approximately 12-17 cm proximal to the anus.  This was biopsied and tattooed distally.  Biopsies show adenocarcinoma.  CT scan of the chest, abdomen and pelvis showed a mass at the rectosigmoid junction approximally 6 cm in length.  MRI shows a T3c potentially T4 rectal tumor just below the peritoneal reflection small pathologic appearing lymph nodes were noted as well.  Distance to the internal sphincter was approximately 12 to 13 cm.  There is no sign of metastatic disease.  She underwent neoadjuvant chemotherapy and radiation and finished this on 7/11.   Review of Systems: A complete review of systems was obtained from the patient.  I have reviewed this information and discussed as appropriate with the patient.  See HPI as well for other ROS.   Medical History: Past Medical History:  Diagnosis Date   Anxiety    Arthritis    History of cancer     Patient Active Problem List  Diagnosis   Adenocarcinoma of colon (CMS-HCC)   Anxiety   IBS (irritable bowel syndrome)   Depression   Eczema, unspecified   Hyperlipidemia   Hypertension   Idiopathic  peripheral neuropathy   Mucus in stool   Rectal bleeding   Rectal cancer (CMS-HCC)   Vitamin D insufficiency    No past surgical history on file.   No Known Allergies  Current Outpatient Medications on File Prior to Visit  Medication Sig Dispense Refill   buPROPion (WELLBUTRIN SR) 200 MG SR tablet Take 200 mg by mouth 2 (two) times daily     losartan (COZAAR) 50 MG tablet Take 1 tablet by mouth once daily     No current facility-administered medications on file prior to visit.    Family History  Problem Relation Age of Onset   Skin cancer Mother    Stroke Father    Coronary Artery Disease (Blocked arteries around heart) Father    Diabetes Father    Obesity Sister    Diabetes Sister    Colon cancer Other    Breast cancer Other      Social History   Tobacco Use  Smoking Status Never Smoker  Smokeless Tobacco Never Used     Social History   Socioeconomic History   Marital status: Married  Tobacco Use   Smoking status: Never Smoker   Smokeless tobacco: Never Used  Scientific laboratory technician Use: Never used  Substance and Sexual Activity   Alcohol use: Never   Drug use: Never   Sexual activity: Defer    Objective:    Vitals:   06/15/21 1611  Weight: 59  kg (130 lb)  Height: 154.9 cm ('5\' 1"'$ )     Exam Gen: NAD CV: RRR Lungs: CTA Abd: soft, nontender    Labs, Imaging and Diagnostic Testing: MRI reviewed.  Appears to be a proximal rectal mass  Assessment and Plan:  Diagnoses and all orders for this visit:  Rectal cancer (CMS-HCC)    We discussed the need for low anterior resection.  I do not think she will need a diverting ileostomy but we did discuss that this is possible.  I did recommend firefly injection for ureter identification during surgery.  We discussed the surgery in detail again and all questions were answered.  Patient wishes to proceed with surgery.  We will plan on doing this in mid September or early October. We discussed the management  of anal warts. We discussed chemical destruction, immunotherapy, and surgical excision. I discussed the pros and cons of each approach. We discussed the risk and benefits and the expected outcome with chemical destruction with topical agents. I explained that topical agents are generally not effective and has a high recurrence rate. We discussed the use of Aldara ointment. I explained that it has a 30-70% chance at resolving or at least reducing the number of external anal warts. I explained that it is applied 3 times a week at night and left on overnight. I explained that skin irritation is the most common side effect. We then discussed surgical excision and fulguration. I explained how the surgery is performed. I explained that it can be painful however it generally has the highest success rate. We discussed the risk and benefits of surgery including but not limited to bleeding, infection, injury to surrounding structures, need to do a formal anoscopic exam to evaluate for anal canal warts, urinary retention, wart recurrence, and general anesthesia risk. We discussed the typical aftercare.

## 2021-06-16 ENCOUNTER — Encounter (HOSPITAL_COMMUNITY): Payer: Self-pay | Admitting: Hematology

## 2021-06-16 NOTE — Progress Notes (Signed)
                                                                                                                                                             Patient Name: Alexandra Bright MRN: [id number] DOB: Nov 15, 1966 Referring Physician: Derek Jack Date of Service: 06/01/2021 [facility], [place]                                                        End Of Treatment Note  Diagnoses: C20-Malignant neoplasm of rectum  Cancer Staging:  Stage IIIB, cT3c-4N1M0 adenocarcinoma of the proximal rectum  Intent: Curative  Radiation Treatment Dates: 04/22/2021 through 06/01/2021 Site Technique Total Dose (Gy) Dose per Fx (Gy) Completed Fx Beam Energies  Rectum: Rectum 3D 45/45 1.8 25/25 10X, 15X  Rectum: Rectum_Bst 3D 5.4/5.4 1.8 3/3 10X, 15X   Narrative: The patient tolerated radiation therapy relatively well. She did note gassiness and abdominal cramping and pelvic discomfort during treatment.  Plan: The patient will receive a call in about one month from the radiation oncology department. She will continue follow up with Dr. Delton Coombes as well as Dr. Marcello Moores in colorectal surgery.   ________________________________________________    Carola Rhine, PAC

## 2021-06-17 ENCOUNTER — Other Ambulatory Visit: Payer: Self-pay

## 2021-06-17 ENCOUNTER — Encounter (HOSPITAL_COMMUNITY): Payer: Self-pay | Admitting: Hematology and Oncology

## 2021-06-17 ENCOUNTER — Inpatient Hospital Stay (HOSPITAL_COMMUNITY): Payer: 59

## 2021-06-17 ENCOUNTER — Inpatient Hospital Stay (HOSPITAL_BASED_OUTPATIENT_CLINIC_OR_DEPARTMENT_OTHER): Payer: 59 | Admitting: Hematology and Oncology

## 2021-06-17 VITALS — BP 109/63 | HR 103 | Temp 96.8°F | Resp 17 | Wt 128.1 lb

## 2021-06-17 DIAGNOSIS — C187 Malignant neoplasm of sigmoid colon: Secondary | ICD-10-CM

## 2021-06-17 DIAGNOSIS — K6289 Other specified diseases of anus and rectum: Secondary | ICD-10-CM

## 2021-06-17 DIAGNOSIS — R748 Abnormal levels of other serum enzymes: Secondary | ICD-10-CM | POA: Diagnosis not present

## 2021-06-17 DIAGNOSIS — C20 Malignant neoplasm of rectum: Secondary | ICD-10-CM

## 2021-06-17 DIAGNOSIS — Z510 Encounter for antineoplastic radiation therapy: Secondary | ICD-10-CM | POA: Diagnosis not present

## 2021-06-17 DIAGNOSIS — R197 Diarrhea, unspecified: Secondary | ICD-10-CM

## 2021-06-17 DIAGNOSIS — C189 Malignant neoplasm of colon, unspecified: Secondary | ICD-10-CM

## 2021-06-17 LAB — COMPREHENSIVE METABOLIC PANEL
ALT: 80 U/L — ABNORMAL HIGH (ref 0–44)
AST: 47 U/L — ABNORMAL HIGH (ref 15–41)
Albumin: 3 g/dL — ABNORMAL LOW (ref 3.5–5.0)
Alkaline Phosphatase: 102 U/L (ref 38–126)
Anion gap: 8 (ref 5–15)
BUN: 8 mg/dL (ref 6–20)
CO2: 22 mmol/L (ref 22–32)
Calcium: 8.5 mg/dL — ABNORMAL LOW (ref 8.9–10.3)
Chloride: 106 mmol/L (ref 98–111)
Creatinine, Ser: 0.8 mg/dL (ref 0.44–1.00)
GFR, Estimated: >60 mL/min (ref >60)
Glucose, Bld: 118 mg/dL — ABNORMAL HIGH (ref 70–99)
Potassium: 3.5 mmol/L (ref 3.5–5.1)
Sodium: 136 mmol/L (ref 135–145)
Total Bilirubin: 0.6 mg/dL (ref 0.3–1.2)
Total Protein: 6.2 g/dL — ABNORMAL LOW (ref 6.5–8.1)

## 2021-06-17 LAB — CBC WITH DIFFERENTIAL/PLATELET
Abs Immature Granulocytes: 0.09 10*3/uL — ABNORMAL HIGH (ref 0.00–0.07)
Basophils Absolute: 0.1 10*3/uL (ref 0.0–0.1)
Basophils Relative: 1 %
Eosinophils Absolute: 0.1 10*3/uL (ref 0.0–0.5)
Eosinophils Relative: 1 %
HCT: 31.2 % — ABNORMAL LOW (ref 36.0–46.0)
Hemoglobin: 10.5 g/dL — ABNORMAL LOW (ref 12.0–15.0)
Immature Granulocytes: 1 %
Lymphocytes Relative: 22 %
Lymphs Abs: 1.8 10*3/uL (ref 0.7–4.0)
MCH: 34.5 pg — ABNORMAL HIGH (ref 26.0–34.0)
MCHC: 33.7 g/dL (ref 30.0–36.0)
MCV: 102.6 fL — ABNORMAL HIGH (ref 80.0–100.0)
Monocytes Absolute: 0.9 10*3/uL (ref 0.1–1.0)
Monocytes Relative: 11 %
Neutro Abs: 5.4 10*3/uL (ref 1.7–7.7)
Neutrophils Relative %: 64 %
Platelets: 281 10*3/uL (ref 150–400)
RBC: 3.04 MIL/uL — ABNORMAL LOW (ref 3.87–5.11)
RDW: 25.2 % — ABNORMAL HIGH (ref 11.5–15.5)
WBC: 8.3 10*3/uL (ref 4.0–10.5)
nRBC: 0 % (ref 0.0–0.2)

## 2021-06-17 LAB — MAGNESIUM: Magnesium: 2 mg/dL (ref 1.7–2.4)

## 2021-06-17 MED ORDER — OXYCODONE-ACETAMINOPHEN 5-325 MG PO TABS: 1.0000 | ORAL_TABLET | Freq: Four times a day (QID) | ORAL | 0 refills | Status: DC | PRN

## 2021-06-17 MED ORDER — TRAMADOL HCL 50 MG PO TABS: 50.0000 mg | ORAL_TABLET | Freq: Two times a day (BID) | ORAL | 0 refills | Status: DC | PRN

## 2021-06-17 NOTE — Assessment & Plan Note (Signed)
She has rectal pain associated with recent treatment I refilled her prescription pain medicine

## 2021-06-17 NOTE — Assessment & Plan Note (Signed)
She has completed chemoradiation therapy Her liver enzymes are improving Her abdominal cramps, pain, nausea and loose stools are also getting better I recommend repeat CT imaging in the first week of September for objective assessment of response to therapy If everything is resolved, she can proceed with surgery as outlined by general surgery

## 2021-06-17 NOTE — Progress Notes (Signed)
[place] FOLLOW-UP progress notes  Patient Care Team: Lindell Spar, MD as PCP - General (Internal Medicine) Brien Mates, RN as Oncology Nurse Navigator (Oncology)  CHIEF COMPLAINTS/PURPOSE OF VISIT:  Rectal cancer, for further evaluation  HISTORY OF PRESENTING ILLNESS:  Alexandra Bright 55 y.o. female was seen because her primary oncologist is not available She had recent diagnosis of colorectal cancer, status post concurrent chemotherapy with Xeloda and radiation treatment She has completed treatment recently She saw general surgery with plan for surgery around middle of September She is doing well She had mild nausea She continues to have loose stool but overall getting better Her pain is improving She denies recent bleeding  MEDICAL HISTORY:  Past Medical History:  Diagnosis Date   Anxiety    Colon cancer ([place]) 01/2021   Constipation    Hypertension     SURGICAL HISTORY: Past Surgical History:  Procedure Laterality Date   BIOPSY  02/18/2021   Procedure: BIOPSY;  Surgeon: Harvel Quale, MD;  [facility];  Service: Gastroenterology;;   FACIAL FRACTURE SURGERY     from [place]  02/18/2021   Procedure: FLEXIBLE SIGMOIDOSCOPY;  Surgeon: Harvel Quale, MD;  Location: [facility];  Service: Gastroenterology;;   POLYPECTOMY  02/18/2021   Procedure: POLYPECTOMY INTESTINAL;  Surgeon: Montez Morita, Quillian Quince, MD;  Location: [facility];  Service: Gastroenterology;;   SUBMUCOSAL TATTOO INJECTION  02/18/2021   Procedure: SUBMUCOSAL TATTOO INJECTION;  Surgeon: Montez Morita, Quillian Quince, MD;  Location: [facility];  Service: Gastroenterology;;    SOCIAL HISTORY: Social History   Socioeconomic History   Marital status: Married    Spouse name: Not on file   Number of children: Not on file   Years of education: Not on file   Highest education level: Not on file  Occupational History   Not on file   Tobacco Use   Smoking status: Former    Packs/day: 0.50    Years: 40.00    Pack years: 20.00    Types: Cigarettes   Smokeless tobacco: Never  Vaping Use   Vaping Use: Never used  Substance and Sexual Activity   Alcohol use: Not Currently   Drug use: Never   Sexual activity: Not Currently  Other Topics Concern   Not on file  Social History Narrative   Not on file   Social Determinants of Health   Financial Resource Strain: Low Risk    Difficulty of Paying Living Expenses: Not hard at all  Food Insecurity: No Food Insecurity   Worried About Running Out of Food in the Last Year: Never true   Cuming in the Last Year: Never true  Transportation Needs: No Transportation Needs   Lack of Transportation (Medical): No   Lack of Transportation (Non-Medical): No  Physical Activity: Insufficiently Active   Days of Exercise per Week: 3 days   Minutes of Exercise per Session: 20 min  Stress: No Stress Concern Present   Feeling of Stress : Only a little  Social Connections: Engineer, building services of Communication with Friends and Family: More than three times a week   Frequency of Social Gatherings with Friends and Family: More than three times a week   Attends Religious Services: More than 4 times per year   Active Member of [employer] or Organizations: Yes   Attends Music therapist: More than 4 times per year   Marital Status: Married  Intimate Partner Violence: Not At Risk   Fear of Current or Ex-Partner: No   Emotionally Abused: No   Physically Abused: No   Sexually Abused: No    FAMILY HISTORY: Family History  Problem Relation Age of Onset   Diabetes Sister    Cancer Maternal Aunt    Ovarian cancer Cousin    Ovarian cancer Paternal Aunt    Prostate cancer Cousin     ALLERGIES:  has No Known Allergies.  MEDICATIONS:  Current Outpatient Medications  Medication Sig Dispense Refill   bisacodyl (DULCOLAX) 5 MG EC tablet Take by mouth.      buPROPion (WELLBUTRIN SR) 200 MG 12 hr tablet Take 1 tablet (200 mg total) by mouth 2 (two) times daily. 60 tablet 5   gabapentin (NEURONTIN) 100 MG capsule Take 1 capsule (100 mg total) by mouth 2 (two) times daily. (Patient taking differently: Take 100 mg by mouth in the morning.) 60 capsule 5   hydrocortisone cream 0.5 % Apply 1 application topically 2 (two) times daily. 30 g 0   losartan (COZAAR) 50 MG tablet Take 1 tablet by mouth once daily 90 tablet 0   Melatonin 10 MG TABS Take 3 tablets by mouth at bedtime.     Multiple Vitamins-Minerals (MULTIVITAMIN WITH MINERALS) tablet Take 1 tablet by mouth in the morning. Women's Multivitamin     ondansetron (ZOFRAN) 8 MG tablet Take 1 tablet (8 mg total) by mouth every 8 (eight) hours as needed for nausea or vomiting. 20 tablet 0   sennosides-docusate sodium (SENOKOT-S) 8.6-50 MG tablet Take 1 tablet by mouth in the morning.     loperamide (IMODIUM A-D) 2 MG tablet Take 2 mg by mouth 4 (four) times daily as needed for diarrhea or loose stools. (Patient not taking: Reported on 06/17/2021)     oxyCODONE-acetaminophen (PERCOCET/ROXICET) 5-325 MG tablet Take 1 tablet by mouth every 6 (six) hours as needed for severe pain (For Cancer Pain). 30 tablet 0   prochlorperazine (COMPAZINE) 10 MG tablet Take 1 tablet (10 mg total) by mouth every 6 (six) hours as needed for nausea or vomiting. (Patient not taking: Reported on 06/17/2021) 120 tablet 3   traMADol (ULTRAM) 50 MG tablet Take 1 tablet (50 mg total) by mouth 2 (two) times daily as needed. 60 tablet 0   No current facility-administered medications for this visit.    REVIEW OF SYSTEMS:   Constitutional: Denies fevers, chills or abnormal night sweats Eyes: Denies blurriness of vision, double vision or watery eyes Ears, nose, mouth, throat, and face: Denies mucositis or sore throat Respiratory: Denies cough, dyspnea or wheezes Cardiovascular: Denies palpitation, chest discomfort or lower extremity  swelling Skin: Denies abnormal skin rashes Lymphatics: Denies new lymphadenopathy or easy bruising Neurological:Denies numbness, tingling or new weaknesses Behavioral/Psych: Mood is stable, no new changes  All other systems were reviewed with the patient and are negative.  PHYSICAL EXAMINATION: ECOG PERFORMANCE STATUS: 1 - Symptomatic but completely ambulatory  Vitals:   06/17/21 1137  BP: 109/63  Pulse: (!) 103  Resp: 17  Temp: (!) 96.8 F (36 C)  SpO2: 99%   Filed Weights   06/17/21 1137  Weight: 128 lb 1.6 oz (58.1 kg)    GENERAL:alert, no distress and comfortable NEURO: no focal motor/sensory deficits  LABORATORY DATA:  I have reviewed the data as listed Lab Results  Component Value Date   WBC 8.3 06/17/2021   HGB 10.5 (L) 06/17/2021   HCT 31.2 (L) 06/17/2021   MCV 102.6 (  H) 06/17/2021   PLT 281 06/17/2021   Recent Labs    11/27/20 1138 03/06/21 0814 05/26/21 1421 06/17/21 1121  NA 140  --  138 136  K 4.6  --  3.6 3.5  CL 102  --  102 106  CO2 19*  --  26 22  GLUCOSE 99  --  129* 118*  BUN 14  --  10 8  CREATININE 0.89 0.90 0.90 0.80  CALCIUM 9.9  --  9.2 8.5*  GFRNONAA 74  --  >60 >60  GFRAA 85  --   --   --   PROT 7.5  --  6.5 6.2*  ALBUMIN 4.5  --  3.7 3.0*  AST 20  --  117* 47*  ALT 25  --  226* 80*  ALKPHOS 102  --  81 102  BILITOT <0.2  --  1.0 0.6   ASSESSMENT & PLAN:  Rectal cancer (HCC) She has completed chemoradiation therapy Her liver enzymes are improving Her abdominal cramps, pain, nausea and loose stools are also getting better I recommend repeat CT imaging in the first week of September for objective assessment of response to therapy If everything is resolved, she can proceed with surgery as outlined by general surgery  Elevated liver enzymes Previously, the elevated liver enzymes are due to chemotherapy They are much improved Observe closely Will repeat again in September  Rectal pain She has rectal pain associated with  recent treatment I refilled her prescription pain medicine  Diarrhea I recommend aggressive oral hydration fluids and Imodium as needed  Orders Placed This Encounter  Procedures   CT CHEST ABDOMEN PELVIS W CONTRAST    Standing Status:   Future    Standing Expiration Date:   06/17/2022    Order Specific Question:   Preferred imaging location?    Answer:   [facility]    Order Specific Question:   Radiology Contrast Protocol - do NOT remove file path    Answer:   [email or web address]    Order Specific Question:   Is patient pregnant?    Answer:   No    All questions were answered. The patient knows to call the clinic with any problems, questions or concerns. The total time spent in the appointment was 20 minutes encounter with patients including review of chart and various tests results, discussions about plan of care and coordination of care plan   Heath Lark, MD 06/17/2021 4:46 PM

## 2021-06-17 NOTE — Assessment & Plan Note (Signed)
I recommend aggressive oral hydration fluids and Imodium as needed

## 2021-06-17 NOTE — Assessment & Plan Note (Signed)
Previously, the elevated liver enzymes are due to chemotherapy They are much improved Observe closely Will repeat again in September

## 2021-06-24 ENCOUNTER — Other Ambulatory Visit (HOSPITAL_COMMUNITY): Payer: Self-pay

## 2021-06-24 ENCOUNTER — Other Ambulatory Visit: Payer: Self-pay | Admitting: Urology

## 2021-06-29 ENCOUNTER — Other Ambulatory Visit: Payer: Self-pay

## 2021-06-29 ENCOUNTER — Ambulatory Visit (HOSPITAL_COMMUNITY)
Admission: RE | Admit: 2021-06-29 | Discharge: 2021-06-29 | Disposition: A | Discharge status: Home / Self Care | Payer: 59 | Source: Ambulatory Visit | Attending: Hematology and Oncology | Admitting: Hematology and Oncology

## 2021-06-29 ENCOUNTER — Inpatient Hospital Stay (HOSPITAL_COMMUNITY): Payer: 59 | Attending: Hematology

## 2021-06-29 DIAGNOSIS — R42 Dizziness and giddiness: Secondary | ICD-10-CM | POA: Insufficient documentation

## 2021-06-29 DIAGNOSIS — C20 Malignant neoplasm of rectum: Secondary | ICD-10-CM

## 2021-06-29 DIAGNOSIS — C187 Malignant neoplasm of sigmoid colon: Secondary | ICD-10-CM

## 2021-06-29 DIAGNOSIS — R109 Unspecified abdominal pain: Secondary | ICD-10-CM | POA: Insufficient documentation

## 2021-06-29 DIAGNOSIS — R110 Nausea: Secondary | ICD-10-CM | POA: Insufficient documentation

## 2021-06-29 DIAGNOSIS — C189 Malignant neoplasm of colon, unspecified: Secondary | ICD-10-CM | POA: Diagnosis present

## 2021-06-29 DIAGNOSIS — G893 Neoplasm related pain (acute) (chronic): Secondary | ICD-10-CM | POA: Insufficient documentation

## 2021-06-29 DIAGNOSIS — F419 Anxiety disorder, unspecified: Secondary | ICD-10-CM | POA: Insufficient documentation

## 2021-06-29 DIAGNOSIS — C19 Malignant neoplasm of rectosigmoid junction: Secondary | ICD-10-CM | POA: Insufficient documentation

## 2021-06-29 DIAGNOSIS — G629 Polyneuropathy, unspecified: Secondary | ICD-10-CM | POA: Insufficient documentation

## 2021-06-29 DIAGNOSIS — Z9221 Personal history of antineoplastic chemotherapy: Secondary | ICD-10-CM | POA: Insufficient documentation

## 2021-06-29 DIAGNOSIS — F32A Depression, unspecified: Secondary | ICD-10-CM | POA: Insufficient documentation

## 2021-06-29 DIAGNOSIS — R519 Headache, unspecified: Secondary | ICD-10-CM | POA: Insufficient documentation

## 2021-06-29 DIAGNOSIS — Z79899 Other long term (current) drug therapy: Secondary | ICD-10-CM | POA: Insufficient documentation

## 2021-06-29 DIAGNOSIS — I10 Essential (primary) hypertension: Secondary | ICD-10-CM | POA: Insufficient documentation

## 2021-06-29 DIAGNOSIS — K219 Gastro-esophageal reflux disease without esophagitis: Secondary | ICD-10-CM | POA: Insufficient documentation

## 2021-06-29 LAB — COMPREHENSIVE METABOLIC PANEL
ALT: 23 U/L (ref 0–44)
AST: 19 U/L (ref 15–41)
Albumin: 3.9 g/dL (ref 3.5–5.0)
Alkaline Phosphatase: 92 U/L (ref 38–126)
Anion gap: 8 (ref 5–15)
BUN: 11 mg/dL (ref 6–20)
CO2: 24 mmol/L (ref 22–32)
Calcium: 9.4 mg/dL (ref 8.9–10.3)
Chloride: 105 mmol/L (ref 98–111)
Creatinine, Ser: 0.94 mg/dL (ref 0.44–1.00)
GFR, Estimated: >60 mL/min (ref >60)
Glucose, Bld: 116 mg/dL — ABNORMAL HIGH (ref 70–99)
Potassium: 4.1 mmol/L (ref 3.5–5.1)
Sodium: 137 mmol/L (ref 135–145)
Total Bilirubin: 0.5 mg/dL (ref 0.3–1.2)
Total Protein: 7.5 g/dL (ref 6.5–8.1)

## 2021-06-29 LAB — CBC WITH DIFFERENTIAL/PLATELET
Abs Immature Granulocytes: 0.07 10*3/uL (ref 0.00–0.07)
Basophils Absolute: 0.1 10*3/uL (ref 0.0–0.1)
Basophils Relative: 1 %
Eosinophils Absolute: 0.1 10*3/uL (ref 0.0–0.5)
Eosinophils Relative: 1 %
HCT: 37.8 % (ref 36.0–46.0)
Hemoglobin: 12.3 g/dL (ref 12.0–15.0)
Immature Granulocytes: 1 %
Lymphocytes Relative: 23 %
Lymphs Abs: 2.4 10*3/uL (ref 0.7–4.0)
MCH: 34.6 pg — ABNORMAL HIGH (ref 26.0–34.0)
MCHC: 32.5 g/dL (ref 30.0–36.0)
MCV: 106.2 fL — ABNORMAL HIGH (ref 80.0–100.0)
Monocytes Absolute: 0.9 10*3/uL (ref 0.1–1.0)
Monocytes Relative: 9 %
Neutro Abs: 6.7 10*3/uL (ref 1.7–7.7)
Neutrophils Relative %: 65 %
Platelets: 344 10*3/uL (ref 150–400)
RBC: 3.56 MIL/uL — ABNORMAL LOW (ref 3.87–5.11)
RDW: 20.4 % — ABNORMAL HIGH (ref 11.5–15.5)
WBC: 10.2 10*3/uL (ref 4.0–10.5)
nRBC: 0 % (ref 0.0–0.2)

## 2021-06-29 LAB — MAGNESIUM: Magnesium: 1.9 mg/dL (ref 1.7–2.4)

## 2021-06-29 MED ORDER — IOHEXOL 350 MG/ML SOLN
80.0000 mL | Freq: Once | INTRAVENOUS | Status: AC | PRN
  Administered 2021-06-29: 80 mL via INTRAVENOUS

## 2021-06-29 NOTE — Patient Instructions (Signed)
DUE TO COVID-19 ONLY ONE VISITOR IS ALLOWED TO COME WITH YOU AND STAY IN THE WAITING ROOM ONLY DURING PRE OP AND PROCEDURE DAY OF SURGERY. THE 2 VISITORS  MAY VISIT WITH YOU AFTER SURGERY IN YOUR PRIVATE ROOM DURING VISITING HOURS ONLY!  YOU NEED TO HAVE A COVID 19 TEST ON__8/17_____ '@_______'$ , THIS TEST MUST BE DONE BEFORE SURGERY,                Alexandra Bright     Your procedure is scheduled on: 07/10/21   Report to [place]  Entrance   Report to admitting at   8:00 AM     Call this number if you have problems the morning of surgery [place], NO [place].     Take these medicines the morning of surgery with A SIP OF WATER: Gabapentin, Wellbutrin  Follow all instructions for bowel prep by the Dr's office  Drink plenty of fluids on the prep day to prevent dehydration.   DRINK 2 PRESURGERY ENSURE DRINKS THE NIGHT BEFORE SURGERY AT 10:00 PM .  NO SOLIDS AFTER MIDNIGHT THE DAY PRIOR TO THE SURGERY.   NOTHING BY MOUTH EXCEPT CLEAR LIQUIDS UNTIL 7:00 am _________.   DRINK THE LAST ENSURE DRINK BY 7:00 am                               You may not have any metal on your body including hair pins and              piercings  Do not wear jewelry, make-up, lotions, powders or perfumes, deodorant             Do not wear nail polish on your fingernails.  Do not shave  48 hours prior to surgery.                Do not bring valuables to the hospital. [place].  Contacts, dentures or bridgework may not be worn into surgery.        Special Instructions: N/A              Please read over the following fact sheets you were given: _____________________________________________________________________             [facility] - Preparing for Surgery Before surgery, you can play an important role.  Because skin is not sterile, your skin needs to  be as free of germs as possible.  You can reduce the number of germs on your skin by washing with CHG (chlorahexidine gluconate) soap before surgery.  CHG is an antiseptic cleaner which kills germs and bonds with the skin to continue killing germs even after washing. Please DO NOT use if you have an allergy to CHG or antibacterial soaps.  If your skin becomes reddened/irritated stop using the CHG and inform your nurse when you arrive at Short Stay. Do not shave (including legs and underarms) for at least 48 hours prior to the first CHG shower.   Please follow these instructions carefully:  1.  Shower with CHG Soap the night before surgery and the  morning of Surgery.  2.  If you choose to wash your hair, wash your hair first as usual with your  normal  shampoo.  3.  After you shampoo, rinse your hair and body thoroughly to remove the  shampoo.                                        4.  Use CHG as you would any other liquid soap.  You can apply chg directly  to the skin and wash                       Gently with a scrungie or clean washcloth.  5.  Apply the CHG Soap to your body ONLY FROM THE NECK DOWN.   Do not use on face/ open                           Wound or open sores. Avoid contact with eyes, ears mouth and genitals (private parts).                       Wash face,  Genitals (private parts) with your normal soap.             6.  Wash thoroughly, paying special attention to the area where your surgery  will be performed.  7.  Thoroughly rinse your body with warm water from the neck down.  8.  DO NOT shower/wash with your normal soap after using and rinsing off  the CHG Soap.            9.  Pat yourself dry with a clean towel.            10.  Wear clean pajamas.            11.  Place clean sheets on your bed the night of your first shower and do not  sleep with pets. Day of Surgery : Do not apply any lotions/deodorants the morning of surgery.  Please wear clean clothes to the hospital/surgery  center.  FAILURE TO FOLLOW THESE INSTRUCTIONS MAY RESULT IN THE CANCELLATION OF YOUR SURGERY PATIENT SIGNATURE_________________________________  NURSE SIGNATURE__________________________________  ________________________________________________________________________   Alexandra Bright  An incentive spirometer is a tool that can help keep your lungs clear and active. This tool measures how well you are filling your lungs with each breath. Taking long deep breaths may help reverse or decrease the chance of developing breathing (pulmonary) problems (especially infection) following: A long period of time when you are unable to move or be active. BEFORE THE PROCEDURE  If the spirometer includes an indicator to show your best effort, your nurse or respiratory therapist will set it to a desired goal. If possible, sit up straight or lean slightly forward. Try not to slouch. Hold the incentive spirometer in an upright position. INSTRUCTIONS FOR USE  Sit on the edge of your bed if possible, or sit up as far as you can in bed or on a chair. Hold the incentive spirometer in an upright position. Breathe out normally. Place the mouthpiece in your mouth and seal your lips tightly around it. Breathe in slowly and as deeply as possible, raising the piston or the ball toward the top of the column. Hold your breath for 3-5 seconds or for as long as possible. Allow the piston or ball to fall to the bottom of the column. Remove the mouthpiece from your mouth and breathe out normally.  Rest for a few seconds and repeat Steps 1 through 7 at least 10 times every 1-2 hours when you are awake. Take your time and take a few normal breaths between deep breaths. The spirometer may include an indicator to show your best effort. Use the indicator as a goal to work toward during each repetition. After each set of 10 deep breaths, practice coughing to be sure your lungs are clear. If you have an incision (the cut  made at the time of surgery), support your incision when coughing by placing a pillow or rolled up towels firmly against it. Once you are able to get out of bed, walk around indoors and cough well. You may stop using the incentive spirometer when instructed by your caregiver.  RISKS AND COMPLICATIONS Take your time so you do not get dizzy or light-headed. If you are in pain, you may need to take or ask for pain medication before doing incentive spirometry. It is harder to take a deep breath if you are having pain. AFTER USE Rest and breathe slowly and easily. It can be helpful to keep track of a log of your progress. Your caregiver can provide you with a simple table to help with this. If you are using the spirometer at home, follow these instructions: [place] IF:  You are having difficultly using the spirometer. You have trouble using the spirometer as often as instructed. Your pain medication is not giving enough relief while using the spirometer. You develop fever of 100.5 F (38.1 C) or higher. SEEK IMMEDIATE MEDICAL CARE IF:  You cough up bloody sputum that had not been present before. You develop fever of 102 F (38.9 C) or greater. You develop worsening pain at or near the incision site. MAKE SURE YOU:  Understand these instructions. Will watch your condition. Will get help right away if you are not doing well or get worse. Document Released: 03/21/2007 Document Revised: 01/31/2012 Document Reviewed: 05/22/2007 [facility] Patient Information 2014 [place], [place].   ________________________________________________________________________

## 2021-06-30 ENCOUNTER — Encounter (HOSPITAL_COMMUNITY): Payer: Self-pay

## 2021-06-30 ENCOUNTER — Other Ambulatory Visit: Payer: Self-pay

## 2021-06-30 ENCOUNTER — Encounter (HOSPITAL_COMMUNITY)
Admission: RE | Admit: 2021-06-30 | Discharge: 2021-06-30 | Disposition: A | Discharge status: Home / Self Care | Payer: 59 | Source: Ambulatory Visit | Attending: General Surgery | Admitting: General Surgery

## 2021-06-30 DIAGNOSIS — Z01818 Encounter for other preprocedural examination: Secondary | ICD-10-CM | POA: Diagnosis not present

## 2021-06-30 HISTORY — DX: Prediabetes: R73.03

## 2021-06-30 HISTORY — DX: Gastro-esophageal reflux disease without esophagitis: K21.9

## 2021-06-30 LAB — HEMOGLOBIN A1C
Hgb A1c MFr Bld: 5.2 % (ref 4.8–5.6)
Mean Plasma Glucose: 102.54 mg/dL

## 2021-06-30 NOTE — Progress Notes (Signed)
COVID Vaccine Completed:Yes  Date COVID Vaccine completed:08/25/20 COVID vaccine manufacturer:   Moderna    PCP - Dr. Shawnie Pons LOV 810/22-epic Cardiologist - none Oncologist-Dr. Midge Minium LOV 06/17/21  Chest x-ray - 03/07/21-epic EKG - 04/09/21-epic Stress Test - no ECHO - no Cardiac Cath - no Pacemaker/ICD device last checked:NA  Sleep Study - no CPAP -   Fasting Blood Sugar - NA Checks Blood Sugar _____ times a day  Blood Thinner Instructions:NA Aspirin Instructions: Last Dose:  Anesthesia review: yes  Patient denies shortness of breath, fever, cough and chest pain at PAT appointment Pt is in treatment for rectal CA. CBC with diff and CMP were done on 06/29/21 and are in Epic.   Patient verbalized understanding of instructions that were given to them at the PAT appointment. Patient was also instructed that they will need to review over the PAT instructions again at home before surgery. yes

## 2021-07-01 ENCOUNTER — Ambulatory Visit: Payer: 59 | Admitting: Internal Medicine

## 2021-07-01 NOTE — Progress Notes (Signed)
[place], [place] [address]   CLINIC:  Medical Oncology/Hematology  PCP:  Lindell Spar, MD [address] / [place] [address] [phone]   REASON FOR VISIT:  Follow-up for sigmoid adenocarcinoma  PRIOR THERAPY: none  NGS Results: not done  CURRENT THERAPY: surveillance  BRIEF ONCOLOGIC HISTORY:  Oncology History  Rectal cancer ([place])  04/08/2021 Initial Diagnosis   Rectal cancer ([place])    04/20/2021 -  Chemotherapy    Patient is on Treatment Plan: COLORECTAL CAPECITABINE + XRT         CANCER STAGING: Cancer Staging Rectal cancer ([place]) Staging form: Colon and Rectum, AJCC 8th Edition - Clinical stage from 04/08/2021: Stage IIIB (cT3, cN1b, cM0) - Unsigned   INTERVAL HISTORY:  [name], a 55 y.o. female, returns for routine follow-up of her sigmoid adenocarcinoma. Alexandra Bright was last seen on 04/13/21.   Today she reports feeling well. She completed radiation therapy on 06/01/21. She reports abdominal cramping and soft, low caliber stools 5-6 times daily. She reports chronic headaches and nausea, but denies vomiting.  REVIEW OF SYSTEMS:  Review of Systems  Constitutional:  Positive for fatigue (70%). Negative for appetite change.  Gastrointestinal:  Positive for abdominal pain (5/10) and nausea. Negative for vomiting.  Neurological:  Positive for dizziness and headaches.  Psychiatric/Behavioral:  Positive for depression. The patient is nervous/anxious.   All other systems reviewed and are negative.  PAST MEDICAL/SURGICAL HISTORY:  Past Medical History:  Diagnosis Date   Anxiety    Colon cancer ([place]) 01/2021   Constipation    GERD (gastroesophageal reflux disease)    Hypertension    Pre-diabetes    Past Surgical History:  Procedure Laterality Date   BIOPSY  02/18/2021   Procedure: BIOPSY;  Surgeon: Harvel Quale, MD;  Location: [facility];  Service: Gastroenterology;;   FACIAL FRACTURE  SURGERY  1991   from [place]  02/18/2021   Procedure: FLEXIBLE SIGMOIDOSCOPY;  Surgeon: Harvel Quale, MD;  Location: [facility];  Service: Gastroenterology;;   POLYPECTOMY  02/18/2021   Procedure: POLYPECTOMY INTESTINAL;  Surgeon: Harvel Quale, MD;  Location: [facility];  Service: Gastroenterology;;   SUBMUCOSAL TATTOO INJECTION  02/18/2021   Procedure: SUBMUCOSAL TATTOO INJECTION;  Surgeon: Harvel Quale, MD;  Location: [facility];  Service: Gastroenterology;;    SOCIAL HISTORY:  Social History   Socioeconomic History   Marital status: Married    Spouse name: Not on file   Number of children: Not on file   Years of education: Not on file   Highest education level: Not on file  Occupational History   Not on file  Tobacco Use   Smoking status: Former    Packs/day: 0.50    Years: 40.00    Pack years: 20.00    Types: Cigarettes    Quit date: 2020    Years since quitting: 2.6   Smokeless tobacco: Never  Vaping Use   Vaping Use: Never used  Substance and Sexual Activity   Alcohol use: Not Currently   Drug use: Never   Sexual activity: Not Currently  Other Topics Concern   Not on file  Social History Narrative   Not on file   Social Determinants of Health   Financial Resource Strain: Low Risk    Difficulty of Paying Living Expenses: Not hard at all  Food Insecurity: No Food Insecurity   Worried About Running Out  of Food in the Last Year: Never true   [place] in the Last Year: Never true  Transportation Needs: No Transportation Needs   Lack of Transportation (Medical): No   Lack of Transportation (Non-Medical): No  Physical Activity: Insufficiently Active   Days of Exercise per Week: 3 days   Minutes of Exercise per Session: 20 min  Stress: No Stress Concern Present   Feeling of Stress : Only a little  Social Connections: Engineer, building services of Communication with Friends and  Family: More than three times a week   Frequency of Social Gatherings with Friends and Family: More than three times a week   Attends Religious Services: More than 4 times per year   Active Member of [employer] or Organizations: Yes   Attends Music therapist: More than 4 times per year   Marital Status: Married  Human resources officer Violence: Not At Risk   Fear of Current or Ex-Partner: No   Emotionally Abused: No   Physically Abused: No   Sexually Abused: No    FAMILY HISTORY:  Family History  Problem Relation Age of Onset   Diabetes Sister    Cancer Maternal Aunt    Ovarian cancer Cousin    Ovarian cancer Paternal Aunt    Prostate cancer Cousin     CURRENT MEDICATIONS:  Current Outpatient Medications  Medication Sig Dispense Refill   buPROPion (WELLBUTRIN SR) 200 MG 12 hr tablet Take 1 tablet (200 mg total) by mouth 2 (two) times daily. 60 tablet 5   diphenhydrAMINE (BENADRYL) 25 MG tablet Take 25 mg by mouth every 6 (six) hours as needed for allergies.     gabapentin (NEURONTIN) 100 MG capsule Take 1 capsule (100 mg total) by mouth 2 (two) times daily. (Patient taking differently: Take 100 mg by mouth in the morning.) 60 capsule 5   hydrocortisone cream 0.5 % Apply 1 application topically 2 (two) times daily. (Patient not taking: Reported on 06/23/2021) 30 g 0   losartan (COZAAR) 50 MG tablet Take 1 tablet by mouth once daily (Patient taking differently: Take 50 mg by mouth daily.) 90 tablet 0   Melatonin 10 MG TABS Take 20 mg by mouth at bedtime as needed (sleep).     Multiple Vitamins-Minerals (MULTIVITAMIN WITH MINERALS) tablet Take 1 tablet by mouth in the morning. Women's Multivitamin     ondansetron (ZOFRAN) 8 MG tablet Take 1 tablet (8 mg total) by mouth every 8 (eight) hours as needed for nausea or vomiting. 20 tablet 0   oxyCODONE-acetaminophen (PERCOCET/ROXICET) 5-325 MG tablet Take 1 tablet by mouth every 6 (six) hours as needed for severe pain (For Cancer Pain). 30  tablet 0   prochlorperazine (COMPAZINE) 10 MG tablet Take 1 tablet (10 mg total) by mouth every 6 (six) hours as needed for nausea or vomiting. 120 tablet 3   sennosides-docusate sodium (SENOKOT-S) 8.6-50 MG tablet Take 1 tablet by mouth daily as needed for constipation.     simethicone (MYLICON) 80 MG chewable tablet Chew 80 mg by mouth every 6 (six) hours as needed for flatulence.     traMADol (ULTRAM) 50 MG tablet Take 1 tablet (50 mg total) by mouth 2 (two) times daily as needed. 60 tablet 0   No current facility-administered medications for this visit.    ALLERGIES:  No Known Allergies  PHYSICAL EXAM:  Performance status (ECOG): 1 - Symptomatic but completely ambulatory  There were no vitals filed for this visit. Wt Readings  from Last 3 Encounters:  06/30/21 124 lb (56.2 kg)  06/17/21 128 lb 1.6 oz (58.1 kg)  05/26/21 135 lb 3.2 oz (61.3 kg)   Physical Exam Vitals reviewed.  Constitutional:      Appearance: Normal appearance.  Cardiovascular:     Rate and Rhythm: Normal rate and regular rhythm.     Pulses: Normal pulses.     Heart sounds: Normal heart sounds.  Pulmonary:     Effort: Pulmonary effort is normal.     Breath sounds: Normal breath sounds.  Neurological:     General: No focal deficit present.     Mental Status: She is alert and oriented to person, place, and time.  Psychiatric:        Mood and Affect: Mood normal.        Behavior: Behavior normal.     LABORATORY DATA:  I have reviewed the labs as listed.  CBC Latest Ref Rng & Units 06/29/2021 06/17/2021 05/26/2021  WBC 4.0 - 10.5 K/uL 10.2 8.3 6.1  Hemoglobin 12.0 - 15.0 g/dL 12.3 10.5(L) 11.0(L)  Hematocrit 36.0 - 46.0 % 37.8 31.2(L) 32.5(L)  Platelets 150 - 400 K/uL 344 281 266   CMP Latest Ref Rng & Units 06/29/2021 06/17/2021 05/26/2021  Glucose 70 - 99 mg/dL 116(H) 118(H) 129(H)  BUN 6 - 20 mg/dL '11 8 10  '$ Creatinine 0.44 - 1.00 mg/dL 0.94 0.80 0.90  Sodium 135 - 145 mmol/L 137 136 138  Potassium 3.5 -  5.1 mmol/L 4.1 3.5 3.6  Chloride 98 - 111 mmol/L 105 106 102  CO2 22 - 32 mmol/L '24 22 26  '$ Calcium 8.9 - 10.3 mg/dL 9.4 8.5(L) 9.2  Total Protein 6.5 - 8.1 g/dL 7.5 6.2(L) 6.5  Total Bilirubin 0.3 - 1.2 mg/dL 0.5 0.6 1.0  Alkaline Phos 38 - 126 U/L 92 102 81  AST 15 - 41 U/L 19 47(H) 117(H)  ALT 0 - 44 U/L 23 80(H) 226(H)    DIAGNOSTIC IMAGING:  I have independently reviewed the scans and discussed with the patient. CT CHEST ABDOMEN PELVIS W CONTRAST  Result Date: 06/30/2021 CLINICAL DATA:  Rectal cancer status post chemotherapy and radiation therapy. EXAM: CT CHEST, ABDOMEN, AND PELVIS WITH CONTRAST TECHNIQUE: Multidetector CT imaging of the chest, abdomen and pelvis was performed following the standard protocol during bolus administration of intravenous contrast. CONTRAST:  52m OMNIPAQUE IOHEXOL 350 MG/ML SOLN COMPARISON:  CT scan 03/06/2021 and MRI pelvis 04/06/2021 FINDINGS: CT CHEST FINDINGS Cardiovascular: The heart is normal in size. No pericardial effusion. The aorta is normal in caliber. No dissection. The branch vessels are patent. A few scattered coronary artery calcifications are noted. Mediastinum/Nodes: No mediastinal or hilar mass or lymphadenopathy. The esophagus is grossly normal. Lungs/Pleura: No pulmonary nodules to suggest pulmonary metastatic disease. No pulmonary lesions. Stable underlying emphysematous changes. No infiltrates or effusions. Musculoskeletal: No breast masses, supraclavicular or axillary adenopathy. The thyroid gland is unremarkable. The bony structures are intact. CT ABDOMEN PELVIS FINDINGS Hepatobiliary: No hepatic lesions to suggest metastatic disease. Gallbladder is unremarkable. No common bile duct dilatation. Pancreas: No mass, inflammation or ductal dilatation. Spleen: Normal size.  No focal lesions. Adrenals/Urinary Tract: Stable bilateral adrenal gland nodules likely benign adenomas. The kidneys are unremarkable and stable.  Bladder is unremarkable.  Stomach/Bowel: The stomach, duodenum and small bowel are unremarkable. No inflammatory changes, mass lesions or obstructive findings. Mild irregular wall thickening involving the rectosigmoid area without discrete mass. No mesorectal or sigmoid mesocolon adenopathy. The remainder of the colon  is unremarkable. Vascular/Lymphatic: The aorta is normal in caliber. No dissection. The branch vessels are patent. The major venous structures are patent. No mesenteric or retroperitoneal mass or adenopathy. Small scattered lymph nodes are noted. Reproductive: The uterus and ovaries are unremarkable. An IUD is noted in the endometrial canal. Other: No pelvic mass or adenopathy. No free pelvic fluid collections. No inguinal mass or adenopathy. No abdominal wall hernia or subcutaneous lesions. Musculoskeletal: No significant bony findings. IMPRESSION: 1. Mild residual irregular wall thickening involving the rectosigmoid area without discrete mass. No mesorectal or sigmoid mesocolon adenopathy. 2. No findings for metastatic disease involving the chest, abdomen or pelvis. 3. Stable bilateral adrenal gland nodules, likely benign adenomas. 4. Stable emphysematous changes. Emphysema (ICD10-J43.9). Electronically Signed   By: Marijo Sanes M.D.   On: 06/30/2021 12:01     ASSESSMENT:  1.  Rectosigmoid sigmoid adenocarcinoma: - Presentation with the bleeding per rectum for the last 5 to 6 months and constipation. - Colonoscopy on 02/18/2021 showed ulcerated partially obstructing mass found from 12-17 cm from the anal verge, circumferential measuring 5 cm in length. - Biopsy consistent with adenocarcinoma. - CT CAP on 03/06/2021 with circumferential wall thickening, hyperenhancement and fat stranding about the rectosigmoid junction, mass measuring approximately 6 cm in length.  No evidence of lymphadenopathy or metastatic disease. - CEA on 02/19/2021 was 1.8. - MRI of the pelvis on 04/06/2021 shows high rectal T3c/T4AN1  malignancy. - Chemoradiation therapy with Xeloda from 04/22/2021 through 06/01/2021. - CT CAP on 06/29/2021 showed improvement in the rectosigmoid thickening at the site of malignancy.  No clear adenopathy.  No evidence of metastatic disease.   2.  Social/family history: - She worked as a Secretary/administrator and recently stopped working.  She quit smoking in October 2021, smoked 1 pack/day for 40 years. - Maternal aunt had lung cancer and was a non-smoker.  Paternal aunt had cancer, type not known to the patient.   PLAN:  1.  T3c/T4N1 rectosigmoid sigmoid adenocarcinoma: - She has completed neoadjuvant chemoradiation therapy on 06/01/2021. - She reports she is having soft stools 4-5 times per day, diameter of couple of pencils.  No bleeding. - She was evaluated by Dr. Marcello Moores and plan for surgery on 07/10/2021. - We reviewed CT CAP findings from 06/29/2021 which did not show any evidence of metastatic disease.  Improvement in rectal wall thickening at the site of malignancy. - We reviewed labs from 06/29/2021 which showed normal LFTs and electrolytes.  CBC was grossly normal. - I have recommended follow-up 4 weeks after surgery. - I have also recommended 4 months of FOLFOX based adjuvant chemotherapy after surgery. - She had only sigmoidoscopy at the time of her cancer diagnosis.  I will reach out to Dr. Marcello Moores to see if intraoperative colonoscopy is feasible.   2.  Rectal pain: - Continue tramadol 50 mg once daily as needed.   3.  Peripheral neuropathy: - She developed neuropathy with numbness in the toes and fingertips after COVID-vaccine in December 2021. - Continue gabapentin 100 mg daily. -She was also diagnosed with prediabetes in January 2022.   Orders placed this encounter:  No orders of the defined types were placed in this encounter.    Derek Jack, MD [place] [phone]   I, Thana Ates, am acting as a scribe for Dr. Derek Jack.  I, Derek Jack MD, have reviewed the above documentation for accuracy and completeness, and I agree with the above.

## 2021-07-02 ENCOUNTER — Inpatient Hospital Stay (HOSPITAL_BASED_OUTPATIENT_CLINIC_OR_DEPARTMENT_OTHER): Payer: 59 | Admitting: Hematology

## 2021-07-02 ENCOUNTER — Other Ambulatory Visit (HOSPITAL_COMMUNITY): Payer: Self-pay | Admitting: *Deleted

## 2021-07-02 ENCOUNTER — Other Ambulatory Visit: Payer: Self-pay

## 2021-07-02 ENCOUNTER — Ambulatory Visit (HOSPITAL_COMMUNITY): Payer: 59 | Admitting: Hematology

## 2021-07-02 VITALS — BP 115/83 | HR 88 | Temp 98.9°F | Resp 18 | Wt 128.8 lb

## 2021-07-02 DIAGNOSIS — R110 Nausea: Secondary | ICD-10-CM | POA: Diagnosis not present

## 2021-07-02 DIAGNOSIS — G893 Neoplasm related pain (acute) (chronic): Secondary | ICD-10-CM | POA: Diagnosis not present

## 2021-07-02 DIAGNOSIS — C189 Malignant neoplasm of colon, unspecified: Secondary | ICD-10-CM

## 2021-07-02 DIAGNOSIS — F419 Anxiety disorder, unspecified: Secondary | ICD-10-CM | POA: Diagnosis not present

## 2021-07-02 DIAGNOSIS — G629 Polyneuropathy, unspecified: Secondary | ICD-10-CM | POA: Diagnosis not present

## 2021-07-02 DIAGNOSIS — R42 Dizziness and giddiness: Secondary | ICD-10-CM | POA: Diagnosis not present

## 2021-07-02 DIAGNOSIS — R519 Headache, unspecified: Secondary | ICD-10-CM | POA: Diagnosis not present

## 2021-07-02 DIAGNOSIS — R109 Unspecified abdominal pain: Secondary | ICD-10-CM | POA: Diagnosis not present

## 2021-07-02 DIAGNOSIS — C20 Malignant neoplasm of rectum: Secondary | ICD-10-CM | POA: Diagnosis not present

## 2021-07-02 DIAGNOSIS — Z9221 Personal history of antineoplastic chemotherapy: Secondary | ICD-10-CM | POA: Diagnosis not present

## 2021-07-02 DIAGNOSIS — K219 Gastro-esophageal reflux disease without esophagitis: Secondary | ICD-10-CM | POA: Diagnosis not present

## 2021-07-02 DIAGNOSIS — Z79899 Other long term (current) drug therapy: Secondary | ICD-10-CM | POA: Diagnosis not present

## 2021-07-02 DIAGNOSIS — I10 Essential (primary) hypertension: Secondary | ICD-10-CM | POA: Diagnosis not present

## 2021-07-02 DIAGNOSIS — C19 Malignant neoplasm of rectosigmoid junction: Secondary | ICD-10-CM | POA: Diagnosis not present

## 2021-07-02 DIAGNOSIS — F32A Depression, unspecified: Secondary | ICD-10-CM | POA: Diagnosis not present

## 2021-07-02 NOTE — Patient Instructions (Addendum)
[facility] Discharge Instructions  You were seen today by Dr. Katragadda. He went over your recent results and scans. Dr. Katragadda will see you back in 1 month for labs and follow up.   Thank you for choosing [facility] to provide your oncology and hematology care.  To afford each patient quality time with our provider, please arrive at least 15 minutes before your scheduled appointment time.   If you have a lab appointment with the Cancer Center please come in thru the Main Entrance and check in at the main information desk  You need to re-schedule your appointment should you arrive 10 or more minutes late.  We strive to give you quality time with our providers, and arriving late affects you and other patients whose appointments are after yours.  Also, if you no show three or more times for appointments you may be dismissed from the clinic at the providers discretion.     Again, thank you for choosing [facility].  Our hope is that these requests will decrease the amount of time that you wait before being seen by our physicians.       _____________________________________________________________  Should you have questions after your visit to [facility], please contact our office at [phone] between the hours of 8:00 a.m. and 4:30 p.m.  Voicemails left after 4:00 p.m. will not be returned until the following business day.  For prescription refill requests, have your pharmacy contact our office and allow 72 hours.    Cancer Center Support Programs:   > Cancer Support Group  2nd Tuesday of the month 1pm-2pm, Journey Room    

## 2021-07-05 ENCOUNTER — Encounter (HOSPITAL_COMMUNITY): Payer: Self-pay | Admitting: Hematology

## 2021-07-06 ENCOUNTER — Ambulatory Visit
Admission: RE | Admit: 2021-07-06 | Discharge: 2021-07-06 | Disposition: A | Discharge status: Home / Self Care | Payer: 59 | Source: Ambulatory Visit | Attending: Radiation Oncology | Admitting: Radiation Oncology

## 2021-07-06 ENCOUNTER — Encounter (HOSPITAL_COMMUNITY): Payer: Self-pay | Admitting: Hematology

## 2021-07-06 DIAGNOSIS — C20 Malignant neoplasm of rectum: Secondary | ICD-10-CM | POA: Insufficient documentation

## 2021-07-06 DIAGNOSIS — C189 Malignant neoplasm of colon, unspecified: Secondary | ICD-10-CM | POA: Insufficient documentation

## 2021-07-06 NOTE — Progress Notes (Signed)
  Radiation Oncology         [phone] ________________________________  Name: Alexandra Bright MRN: [id number]  Date of Service: 07/06/2021  DOB: 04-12-1966  Post Treatment Telephone Note  Diagnosis:   Stage IIIB, cT3c-4N1M0 adenocarcinoma of the proximal rectum  Interval Since Last Radiation:  5 weeks   04/22/2021 through 06/01/2021 Site Technique Total Dose (Gy) Dose per Fx (Gy) Completed Fx Beam Energies  Rectum: Rectum 3D 45/45 1.8 25/25 10X, 15X  Rectum: Rectum_Bst 3D 5.4/5.4 1.8 3/3 10X, 15X    Narrative:  The patient was contacted today for routine follow-up. During treatment she did very well with radiotherapy. She is scheduled for surgery on 07/10/21 with Dr. Marcello Moores for robotic LAR. She feels that the skin of her hands and soles of her feet are improved since completing xeloda. Her bowel frequency has improved as well. No other complaints are verbalized.   Impression/Plan: 1. Stage IIIB, cT3c-4N1M0 adenocarcinoma of the proximal rectumhe patient has been doing well since completion of radiotherapy. We discussed that we would be happy to continue to follow her as needed, but she will also continue to follow up with Dr. Delton Coombes in medical oncology and Dr. Marcello Moores in colorectal surgery.       Carola Rhine, PAC

## 2021-07-08 ENCOUNTER — Other Ambulatory Visit: Payer: Self-pay | Admitting: General Surgery

## 2021-07-08 LAB — SARS CORONAVIRUS 2 (TAT 6-24 HRS): SARS Coronavirus 2: NEGATIVE

## 2021-07-09 ENCOUNTER — Encounter (HOSPITAL_COMMUNITY): Payer: Self-pay | Admitting: *Deleted

## 2021-07-09 ENCOUNTER — Encounter (HOSPITAL_COMMUNITY): Payer: Self-pay

## 2021-07-09 MED ORDER — BUPIVACAINE LIPOSOME 1.3 % IJ SUSP
20.0000 mL | Freq: Once | INTRAMUSCULAR | Status: DC
  Filled 2021-07-09: qty 20

## 2021-07-09 NOTE — Anesthesia Preprocedure Evaluation (Addendum)
Anesthesia Evaluation  Patient identified by MRN, date of birth, ID band Patient awake    Reviewed: Allergy & Precautions, NPO status , Patient's Chart, lab work & pertinent test results  History of Anesthesia Complications Negative for: history of anesthetic complications  Airway Mallampati: II  TM Distance: >3 FB Neck ROM: Full    Dental  (+) Missing,    Pulmonary former smoker,    Pulmonary exam normal        Cardiovascular hypertension, Pt. on medications Normal cardiovascular exam     Neuro/Psych Anxiety Depression negative neurological ROS     GI/Hepatic Neg liver ROS, GERD  Controlled,Rectal ca   Endo/Other  negative endocrine ROS  Renal/GU negative Renal ROS  negative genitourinary   Musculoskeletal negative musculoskeletal ROS (+)   Abdominal   Peds  Hematology negative hematology ROS (+)   Anesthesia Other Findings Day of surgery medications reviewed with patient.  Reproductive/Obstetrics negative OB ROS                            Anesthesia Physical Anesthesia Plan  ASA: 2  Anesthesia Plan: General   Post-op Pain Management:    Induction:   PONV Risk Score and Plan: 4 or greater and Midazolam, Ondansetron, Dexamethasone, Treatment may vary due to age or medical condition and Scopolamine patch - Pre-op  Airway Management Planned: Oral ETT  Additional Equipment: None  Intra-op Plan:   Post-operative Plan: Extubation in OR  Informed Consent: I have reviewed the patients History and Physical, chart, labs and discussed the procedure including the risks, benefits and alternatives for the proposed anesthesia with the patient or authorized representative who has indicated his/her understanding and acceptance.     Dental advisory given  Plan Discussed with: CRNA  Anesthesia Plan Comments:        Anesthesia Quick Evaluation

## 2021-07-10 ENCOUNTER — Other Ambulatory Visit: Payer: Self-pay

## 2021-07-10 ENCOUNTER — Inpatient Hospital Stay (HOSPITAL_COMMUNITY)
Admission: RE | Admit: 2021-07-10 | Discharge: 2021-07-12 | DRG: 330 | Disposition: A | Discharge status: Home / Self Care | Payer: 59 | Attending: General Surgery | Admitting: General Surgery

## 2021-07-10 ENCOUNTER — Inpatient Hospital Stay (HOSPITAL_COMMUNITY): Payer: 59 | Admitting: Physician Assistant

## 2021-07-10 ENCOUNTER — Inpatient Hospital Stay (HOSPITAL_COMMUNITY): Payer: 59 | Admitting: Anesthesiology

## 2021-07-10 ENCOUNTER — Encounter (HOSPITAL_COMMUNITY)
Admission: RE | Disposition: A | Discharge status: Home / Self Care | Payer: Self-pay | Source: Home / Self Care | Attending: General Surgery

## 2021-07-10 ENCOUNTER — Encounter (HOSPITAL_COMMUNITY): Payer: Self-pay | Admitting: General Surgery

## 2021-07-10 DIAGNOSIS — Z8249 Family history of ischemic heart disease and other diseases of the circulatory system: Secondary | ICD-10-CM

## 2021-07-10 DIAGNOSIS — C20 Malignant neoplasm of rectum: Secondary | ICD-10-CM | POA: Diagnosis present

## 2021-07-10 DIAGNOSIS — C772 Secondary and unspecified malignant neoplasm of intra-abdominal lymph nodes: Secondary | ICD-10-CM | POA: Diagnosis present

## 2021-07-10 DIAGNOSIS — Z79899 Other long term (current) drug therapy: Secondary | ICD-10-CM | POA: Diagnosis not present

## 2021-07-10 DIAGNOSIS — L309 Dermatitis, unspecified: Secondary | ICD-10-CM | POA: Diagnosis present

## 2021-07-10 DIAGNOSIS — Z833 Family history of diabetes mellitus: Secondary | ICD-10-CM | POA: Diagnosis not present

## 2021-07-10 DIAGNOSIS — Z808 Family history of malignant neoplasm of other organs or systems: Secondary | ICD-10-CM

## 2021-07-10 DIAGNOSIS — E785 Hyperlipidemia, unspecified: Secondary | ICD-10-CM | POA: Diagnosis present

## 2021-07-10 DIAGNOSIS — Z800 Family history of malignant neoplasm of digestive organs: Secondary | ICD-10-CM

## 2021-07-10 DIAGNOSIS — Z923 Personal history of irradiation: Secondary | ICD-10-CM

## 2021-07-10 DIAGNOSIS — M1990 Unspecified osteoarthritis, unspecified site: Secondary | ICD-10-CM | POA: Diagnosis present

## 2021-07-10 DIAGNOSIS — Z9221 Personal history of antineoplastic chemotherapy: Secondary | ICD-10-CM | POA: Diagnosis not present

## 2021-07-10 DIAGNOSIS — Z803 Family history of malignant neoplasm of breast: Secondary | ICD-10-CM

## 2021-07-10 DIAGNOSIS — G609 Hereditary and idiopathic neuropathy, unspecified: Secondary | ICD-10-CM | POA: Diagnosis present

## 2021-07-10 DIAGNOSIS — E559 Vitamin D deficiency, unspecified: Secondary | ICD-10-CM | POA: Diagnosis present

## 2021-07-10 DIAGNOSIS — A630 Anogenital (venereal) warts: Secondary | ICD-10-CM | POA: Diagnosis present

## 2021-07-10 DIAGNOSIS — I10 Essential (primary) hypertension: Secondary | ICD-10-CM | POA: Diagnosis present

## 2021-07-10 DIAGNOSIS — C19 Malignant neoplasm of rectosigmoid junction: Secondary | ICD-10-CM | POA: Diagnosis present

## 2021-07-10 DIAGNOSIS — Z823 Family history of stroke: Secondary | ICD-10-CM | POA: Diagnosis not present

## 2021-07-10 HISTORY — PX: XI ROBOTIC ASSISTED LOWER ANTERIOR RESECTION: SHX6558

## 2021-07-10 LAB — TYPE AND SCREEN
ABO/RH(D): A POS
Antibody Screen: NEGATIVE

## 2021-07-10 LAB — ABO/RH: ABO/RH(D): A POS

## 2021-07-10 SURGERY — RESECTION, RECTUM, LOW ANTERIOR, ROBOT-ASSISTED
Anesthesia: General | Site: Ureter

## 2021-07-10 MED ORDER — HEPARIN SODIUM (PORCINE) 5000 UNIT/ML IJ SOLN
5000.0000 [IU] | Freq: Once | INTRAMUSCULAR | Status: AC
  Administered 2021-07-10: 5000 [IU] via SUBCUTANEOUS
  Filled 2021-07-10: qty 1

## 2021-07-10 MED ORDER — HYDROMORPHONE HCL 1 MG/ML IJ SOLN
0.5000 mg | INTRAMUSCULAR | Status: DC | PRN
  Administered 2021-07-10 – 2021-07-11 (×2): 0.5 mg via INTRAVENOUS
  Filled 2021-07-10 (×2): qty 0.5

## 2021-07-10 MED ORDER — LIDOCAINE HCL 2 % IJ SOLN
INTRAMUSCULAR | Status: AC
  Filled 2021-07-10: qty 20

## 2021-07-10 MED ORDER — SIMETHICONE 80 MG PO CHEW: 80.0000 mg | CHEWABLE_TABLET | Freq: Four times a day (QID) | ORAL | Status: DC | PRN

## 2021-07-10 MED ORDER — EPHEDRINE 5 MG/ML INJ
INTRAVENOUS | Status: AC
  Filled 2021-07-10: qty 10

## 2021-07-10 MED ORDER — LIDOCAINE 2% (20 MG/ML) 5 ML SYRINGE
INTRAMUSCULAR | Status: DC | PRN
  Administered 2021-07-10: 60 mg via INTRAVENOUS

## 2021-07-10 MED ORDER — BUPIVACAINE-EPINEPHRINE (PF) 0.25% -1:200000 IJ SOLN
INTRAMUSCULAR | Status: AC
  Filled 2021-07-10: qty 30

## 2021-07-10 MED ORDER — FENTANYL CITRATE (PF) 250 MCG/5ML IJ SOLN
INTRAMUSCULAR | Status: AC
  Filled 2021-07-10: qty 5

## 2021-07-10 MED ORDER — HYDROMORPHONE HCL 1 MG/ML IJ SOLN
INTRAMUSCULAR | Status: AC
  Filled 2021-07-10: qty 1

## 2021-07-10 MED ORDER — DIPHENHYDRAMINE HCL 25 MG PO TABS
25.0000 mg | ORAL_TABLET | Freq: Four times a day (QID) | ORAL | Status: DC | PRN
  Filled 2021-07-10: qty 1

## 2021-07-10 MED ORDER — SODIUM CHLORIDE 0.9 % IV SOLN
2.0000 g | INTRAVENOUS | Status: AC
  Administered 2021-07-10: 2 g via INTRAVENOUS
  Filled 2021-07-10: qty 2

## 2021-07-10 MED ORDER — ALVIMOPAN 12 MG PO CAPS
12.0000 mg | ORAL_CAPSULE | ORAL | Status: AC
  Administered 2021-07-10: 12 mg via ORAL
  Filled 2021-07-10: qty 1

## 2021-07-10 MED ORDER — HYDROMORPHONE HCL 1 MG/ML IJ SOLN
0.2500 mg | INTRAMUSCULAR | Status: DC | PRN
  Administered 2021-07-10: 0.25 mg via INTRAVENOUS
  Administered 2021-07-10 (×2): 0.5 mg via INTRAVENOUS

## 2021-07-10 MED ORDER — OXYCODONE HCL 5 MG PO TABS
5.0000 mg | ORAL_TABLET | ORAL | Status: DC | PRN
  Administered 2021-07-11 – 2021-07-12 (×3): 5 mg via ORAL
  Filled 2021-07-10 (×3): qty 1

## 2021-07-10 MED ORDER — BUPIVACAINE LIPOSOME 1.3 % IJ SUSP
INTRAMUSCULAR | Status: DC | PRN
  Administered 2021-07-10: 20 mL

## 2021-07-10 MED ORDER — ONDANSETRON HCL 4 MG/2ML IJ SOLN
INTRAMUSCULAR | Status: DC | PRN
  Administered 2021-07-10: 4 mg via INTRAVENOUS

## 2021-07-10 MED ORDER — MIDAZOLAM HCL 2 MG/2ML IJ SOLN
INTRAMUSCULAR | Status: DC | PRN
  Administered 2021-07-10: 2 mg via INTRAVENOUS

## 2021-07-10 MED ORDER — LIDOCAINE HCL (PF) 2 % IJ SOLN
INTRAMUSCULAR | Status: DC | PRN
  Administered 2021-07-10: 1.5 mg/kg/h via INTRADERMAL

## 2021-07-10 MED ORDER — HYDROMORPHONE HCL 1 MG/ML IJ SOLN
INTRAMUSCULAR | Status: AC
  Administered 2021-07-10: 0.25 mg via INTRAVENOUS
  Filled 2021-07-10: qty 1

## 2021-07-10 MED ORDER — KCL IN DEXTROSE-NACL 20-5-0.45 MEQ/L-%-% IV SOLN
INTRAVENOUS | Status: DC
  Filled 2021-07-10 (×2): qty 1000

## 2021-07-10 MED ORDER — BUPROPION HCL ER (SR) 100 MG PO TB12
200.0000 mg | ORAL_TABLET | Freq: Two times a day (BID) | ORAL | Status: DC
  Administered 2021-07-10 – 2021-07-12 (×4): 200 mg via ORAL
  Filled 2021-07-10 (×4): qty 2

## 2021-07-10 MED ORDER — DEXAMETHASONE SODIUM PHOSPHATE 10 MG/ML IJ SOLN
INTRAMUSCULAR | Status: DC | PRN
  Administered 2021-07-10: 6 mg via INTRAVENOUS

## 2021-07-10 MED ORDER — SODIUM CHLORIDE 0.9 % IV SOLN
2.0000 g | Freq: Two times a day (BID) | INTRAVENOUS | Status: AC
  Administered 2021-07-10: 2 g via INTRAVENOUS
  Filled 2021-07-10: qty 2

## 2021-07-10 MED ORDER — LACTATED RINGERS IV SOLN
INTRAVENOUS | Status: DC
Start: 2021-07-10 — End: 2021-07-10

## 2021-07-10 MED ORDER — FENTANYL CITRATE (PF) 100 MCG/2ML IJ SOLN
INTRAMUSCULAR | Status: AC
  Filled 2021-07-10: qty 2

## 2021-07-10 MED ORDER — INDOCYANINE GREEN 25 MG IV SOLR
INTRAVENOUS | Status: DC | PRN
  Administered 2021-07-10: 20 mg

## 2021-07-10 MED ORDER — GABAPENTIN 300 MG PO CAPS
300.0000 mg | ORAL_CAPSULE | ORAL | Status: DC
  Filled 2021-07-10: qty 1

## 2021-07-10 MED ORDER — SACCHAROMYCES BOULARDII 250 MG PO CAPS
250.0000 mg | ORAL_CAPSULE | Freq: Two times a day (BID) | ORAL | Status: DC
  Administered 2021-07-10 – 2021-07-12 (×4): 250 mg via ORAL
  Filled 2021-07-10 (×4): qty 1

## 2021-07-10 MED ORDER — ENSURE PRE-SURGERY PO LIQD: 592.0000 mL | Freq: Once | ORAL | Status: DC

## 2021-07-10 MED ORDER — ORAL CARE MOUTH RINSE: 15.0000 mL | Freq: Once | OROMUCOSAL | Status: AC

## 2021-07-10 MED ORDER — ENSURE SURGERY PO LIQD: 237.0000 mL | Freq: Two times a day (BID) | ORAL | Status: DC

## 2021-07-10 MED ORDER — ONDANSETRON HCL 4 MG PO TABS: 4.0000 mg | ORAL_TABLET | Freq: Four times a day (QID) | ORAL | Status: DC | PRN

## 2021-07-10 MED ORDER — ACETAMINOPHEN 500 MG PO TABS
1000.0000 mg | ORAL_TABLET | Freq: Four times a day (QID) | ORAL | Status: DC
  Administered 2021-07-10 – 2021-07-12 (×7): 1000 mg via ORAL
  Filled 2021-07-10 (×7): qty 2

## 2021-07-10 MED ORDER — STERILE WATER FOR INJECTION IJ SOLN
INTRAMUSCULAR | Status: AC
  Filled 2021-07-10: qty 10

## 2021-07-10 MED ORDER — SUGAMMADEX SODIUM 200 MG/2ML IV SOLN
INTRAVENOUS | Status: DC | PRN
  Administered 2021-07-10: 200 mg via INTRAVENOUS

## 2021-07-10 MED ORDER — LACTATED RINGERS IV SOLN: INTRAVENOUS | Status: DC | PRN

## 2021-07-10 MED ORDER — CHLORHEXIDINE GLUCONATE 0.12 % MT SOLN
15.0000 mL | Freq: Once | OROMUCOSAL | Status: AC
  Administered 2021-07-10: 15 mL via OROMUCOSAL

## 2021-07-10 MED ORDER — KETAMINE HCL 10 MG/ML IJ SOLN
INTRAMUSCULAR | Status: AC
  Filled 2021-07-10: qty 1

## 2021-07-10 MED ORDER — MIDAZOLAM HCL 2 MG/2ML IJ SOLN
INTRAMUSCULAR | Status: AC
  Filled 2021-07-10: qty 2

## 2021-07-10 MED ORDER — PHENYLEPHRINE HCL-NACL 20-0.9 MG/250ML-% IV SOLN
INTRAVENOUS | Status: DC | PRN
Start: 2021-07-10 — End: 2021-07-10
  Administered 2021-07-10: 30 ug/min via INTRAVENOUS

## 2021-07-10 MED ORDER — ENOXAPARIN SODIUM 40 MG/0.4ML IJ SOSY
40.0000 mg | PREFILLED_SYRINGE | INTRAMUSCULAR | Status: DC
  Administered 2021-07-11 – 2021-07-12 (×2): 40 mg via SUBCUTANEOUS
  Filled 2021-07-10 (×2): qty 0.4

## 2021-07-10 MED ORDER — ENSURE PRE-SURGERY PO LIQD: 296.0000 mL | Freq: Once | ORAL | Status: DC

## 2021-07-10 MED ORDER — ROCURONIUM BROMIDE 10 MG/ML (PF) SYRINGE
PREFILLED_SYRINGE | INTRAVENOUS | Status: AC
  Filled 2021-07-10: qty 10

## 2021-07-10 MED ORDER — PHENYLEPHRINE HCL (PRESSORS) 10 MG/ML IV SOLN
INTRAVENOUS | Status: AC
  Filled 2021-07-10: qty 2

## 2021-07-10 MED ORDER — LABETALOL HCL 5 MG/ML IV SOLN
INTRAVENOUS | Status: AC
  Filled 2021-07-10: qty 8

## 2021-07-10 MED ORDER — LACTATED RINGERS IR SOLN
Status: DC | PRN
  Administered 2021-07-10: 1000 mL

## 2021-07-10 MED ORDER — ONDANSETRON HCL 4 MG/2ML IJ SOLN
INTRAMUSCULAR | Status: AC
  Filled 2021-07-10: qty 2

## 2021-07-10 MED ORDER — KETAMINE HCL 10 MG/ML IJ SOLN
INTRAMUSCULAR | Status: DC | PRN
  Administered 2021-07-10: 25 mg via INTRAVENOUS

## 2021-07-10 MED ORDER — BUPIVACAINE-EPINEPHRINE 0.25% -1:200000 IJ SOLN
INTRAMUSCULAR | Status: DC | PRN
  Administered 2021-07-10: 30 mL

## 2021-07-10 MED ORDER — ACETAMINOPHEN 500 MG PO TABS: 1000.0000 mg | ORAL_TABLET | ORAL | Status: DC

## 2021-07-10 MED ORDER — ALVIMOPAN 12 MG PO CAPS: 12.0000 mg | ORAL_CAPSULE | Freq: Two times a day (BID) | ORAL | Status: DC

## 2021-07-10 MED ORDER — PROMETHAZINE HCL 25 MG/ML IJ SOLN: 6.2500 mg | INTRAMUSCULAR | Status: DC | PRN

## 2021-07-10 MED ORDER — ALUM & MAG HYDROXIDE-SIMETH 200-200-20 MG/5ML PO SUSP: 30.0000 mL | Freq: Four times a day (QID) | ORAL | Status: DC | PRN

## 2021-07-10 MED ORDER — DEXAMETHASONE SODIUM PHOSPHATE 10 MG/ML IJ SOLN
INTRAMUSCULAR | Status: AC
  Filled 2021-07-10: qty 1

## 2021-07-10 MED ORDER — FENTANYL CITRATE (PF) 100 MCG/2ML IJ SOLN
INTRAMUSCULAR | Status: DC | PRN
  Administered 2021-07-10 (×7): 50 ug via INTRAVENOUS

## 2021-07-10 MED ORDER — ONDANSETRON HCL 4 MG/2ML IJ SOLN: 4.0000 mg | Freq: Four times a day (QID) | INTRAMUSCULAR | Status: DC | PRN

## 2021-07-10 MED ORDER — LOSARTAN POTASSIUM 50 MG PO TABS
50.0000 mg | ORAL_TABLET | Freq: Every day | ORAL | Status: DC
  Administered 2021-07-10 – 2021-07-12 (×2): 50 mg via ORAL
  Filled 2021-07-10 (×3): qty 1

## 2021-07-10 MED ORDER — EPHEDRINE 5 MG/ML INJ
INTRAVENOUS | Status: AC
  Filled 2021-07-10: qty 5

## 2021-07-10 MED ORDER — MELATONIN 5 MG PO TABS: 20.0000 mg | ORAL_TABLET | Freq: Every evening | ORAL | Status: DC | PRN

## 2021-07-10 MED ORDER — LIDOCAINE 2% (20 MG/ML) 5 ML SYRINGE
INTRAMUSCULAR | Status: AC
  Filled 2021-07-10: qty 5

## 2021-07-10 MED ORDER — GABAPENTIN 100 MG PO CAPS
100.0000 mg | ORAL_CAPSULE | Freq: Every day | ORAL | Status: DC
  Administered 2021-07-11 – 2021-07-12 (×2): 100 mg via ORAL
  Filled 2021-07-10 (×2): qty 1

## 2021-07-10 MED ORDER — PHENYLEPHRINE 40 MCG/ML (10ML) SYRINGE FOR IV PUSH (FOR BLOOD PRESSURE SUPPORT)
PREFILLED_SYRINGE | INTRAVENOUS | Status: DC | PRN
  Administered 2021-07-10: 80 ug via INTRAVENOUS
  Administered 2021-07-10: 120 ug via INTRAVENOUS
  Administered 2021-07-10: 80 ug via INTRAVENOUS
  Administered 2021-07-10: 120 ug via INTRAVENOUS

## 2021-07-10 MED ORDER — SODIUM CHLORIDE 0.9 % IR SOLN
Status: DC | PRN
  Administered 2021-07-10: 1000 mL

## 2021-07-10 MED ORDER — ROCURONIUM BROMIDE 10 MG/ML (PF) SYRINGE
PREFILLED_SYRINGE | INTRAVENOUS | Status: DC | PRN
Start: 2021-07-10 — End: 2021-07-10
  Administered 2021-07-10: 50 mg via INTRAVENOUS
  Administered 2021-07-10: 10 mg via INTRAVENOUS

## 2021-07-10 MED ORDER — PROPOFOL 10 MG/ML IV BOLUS
INTRAVENOUS | Status: DC | PRN
Start: 2021-07-10 — End: 2021-07-10
  Administered 2021-07-10: 120 mg via INTRAVENOUS
  Administered 2021-07-10: 30 mg via INTRAVENOUS

## 2021-07-10 MED ORDER — ACETAMINOPHEN 500 MG PO TABS
1000.0000 mg | ORAL_TABLET | Freq: Once | ORAL | Status: AC
  Administered 2021-07-10: 1000 mg via ORAL
  Filled 2021-07-10: qty 2

## 2021-07-10 MED ORDER — PHENYLEPHRINE 40 MCG/ML (10ML) SYRINGE FOR IV PUSH (FOR BLOOD PRESSURE SUPPORT)
PREFILLED_SYRINGE | INTRAVENOUS | Status: AC
  Filled 2021-07-10: qty 10

## 2021-07-10 MED ORDER — SCOPOLAMINE 1 MG/3DAYS TD PT72
1.0000 | MEDICATED_PATCH | Freq: Once | TRANSDERMAL | Status: DC
  Administered 2021-07-10: 1.5 mg via TRANSDERMAL
  Filled 2021-07-10: qty 1

## 2021-07-10 SURGICAL SUPPLY — 100 items
ADAPTER GOLDBERG URETERAL (ADAPTER) IMPLANT
BAG COUNTER SPONGE SURGICOUNT (BAG) ×3 IMPLANT
BAG URO CATCHER STRL LF (MISCELLANEOUS) ×3 IMPLANT
BLADE EXTENDED COATED 6.5IN (ELECTRODE) IMPLANT
CANNULA REDUC XI 12-8 STAPL (CANNULA) ×3
CANNULA REDUCER 12-8 DVNC XI (CANNULA) ×2 IMPLANT
CATH URET 5FR 28IN OPEN ENDED (CATHETERS) ×3 IMPLANT
CELLS DAT CNTRL 66122 CELL SVR (MISCELLANEOUS) IMPLANT
CLOTH BEACON ORANGE TIMEOUT ST (SAFETY) ×3 IMPLANT
COVER SURGICAL LIGHT HANDLE (MISCELLANEOUS) ×6 IMPLANT
COVER TIP SHEARS 8 DVNC (MISCELLANEOUS) ×2 IMPLANT
COVER TIP SHEARS 8MM DA VINCI (MISCELLANEOUS) ×3
DECANTER SPIKE VIAL GLASS SM (MISCELLANEOUS) IMPLANT
DERMABOND ADVANCED (GAUZE/BANDAGES/DRESSINGS) ×1
DERMABOND ADVANCED .7 DNX12 (GAUZE/BANDAGES/DRESSINGS) ×2 IMPLANT
DRAIN CHANNEL 19F RND (DRAIN) ×3 IMPLANT
DRAPE ARM DVNC X/XI (DISPOSABLE) ×8 IMPLANT
DRAPE COLUMN DVNC XI (DISPOSABLE) ×2 IMPLANT
DRAPE DA VINCI XI ARM (DISPOSABLE) ×12
DRAPE DA VINCI XI COLUMN (DISPOSABLE) ×3
DRAPE SHEET LG 3/4 BI-LAMINATE (DRAPES) ×6 IMPLANT
DRAPE SURG IRRIG POUCH 19X23 (DRAPES) ×3 IMPLANT
DRAPE UTILITY XL STRL (DRAPES) ×3 IMPLANT
DRSG OPSITE POSTOP 4X10 (GAUZE/BANDAGES/DRESSINGS) IMPLANT
DRSG OPSITE POSTOP 4X6 (GAUZE/BANDAGES/DRESSINGS) ×3 IMPLANT
DRSG OPSITE POSTOP 4X8 (GAUZE/BANDAGES/DRESSINGS) IMPLANT
ELECT PENCIL ROCKER SW 15FT (MISCELLANEOUS) ×3 IMPLANT
ELECT REM PT RETURN 15FT ADLT (MISCELLANEOUS) ×3 IMPLANT
ENDOLOOP SUT PDS II  0 18 (SUTURE)
ENDOLOOP SUT PDS II 0 18 (SUTURE) IMPLANT
EVACUATOR SILICONE 100CC (DRAIN) ×3 IMPLANT
GLOVE SURG ENC MOIS LTX SZ6.5 (GLOVE) ×9 IMPLANT
GLOVE SURG ENC TEXT LTX SZ7.5 (GLOVE) ×3 IMPLANT
GLOVE SURG UNDER POLY LF SZ7 (GLOVE) ×6 IMPLANT
GOWN STRL REUS W/TWL LRG LVL3 (GOWN DISPOSABLE) ×3 IMPLANT
GOWN STRL REUS W/TWL XL LVL3 (GOWN DISPOSABLE) ×18 IMPLANT
GRASPER SUT TROCAR 14GX15 (MISCELLANEOUS) IMPLANT
GUIDEWIRE ANG ZIPWIRE 038X150 (WIRE) IMPLANT
GUIDEWIRE STR DUAL SENSOR (WIRE) ×3 IMPLANT
HOLDER FOLEY CATH W/STRAP (MISCELLANEOUS) ×3 IMPLANT
IRRIG SUCT STRYKERFLOW 2 WTIP (MISCELLANEOUS) ×3
IRRIGATION SUCT STRKRFLW 2 WTP (MISCELLANEOUS) ×2 IMPLANT
KIT PROCEDURE DA VINCI SI (MISCELLANEOUS) ×3
KIT PROCEDURE DVNC SI (MISCELLANEOUS) ×2 IMPLANT
KIT TURNOVER KIT A (KITS) ×6 IMPLANT
MANIFOLD NEPTUNE II (INSTRUMENTS) ×3 IMPLANT
NEEDLE INSUFFLATION 14GA 120MM (NEEDLE) ×3 IMPLANT
PACK CARDIOVASCULAR III (CUSTOM PROCEDURE TRAY) ×3 IMPLANT
PACK COLON (CUSTOM PROCEDURE TRAY) ×3 IMPLANT
PACK CYSTO (CUSTOM PROCEDURE TRAY) ×3 IMPLANT
PAD POSITIONING PINK XL (MISCELLANEOUS) ×3 IMPLANT
RELOAD STAPLER 3.5X60 BLU DVNC (STAPLE) IMPLANT
RELOAD STAPLER 4.3X60 GRN DVNC (STAPLE) ×2 IMPLANT
RTRCTR WOUND ALEXIS 18CM MED (MISCELLANEOUS)
SCISSORS LAP 5X35 DISP (ENDOMECHANICALS) IMPLANT
SEAL CANN UNIV 5-8 DVNC XI (MISCELLANEOUS) ×8 IMPLANT
SEAL XI 5MM-8MM UNIVERSAL (MISCELLANEOUS) ×12
SEALER VESSEL DA VINCI XI (MISCELLANEOUS) ×3
SEALER VESSEL EXT DVNC XI (MISCELLANEOUS) ×2 IMPLANT
SOLUTION ELECTROLUBE (MISCELLANEOUS) ×3 IMPLANT
STAPLER 60 DA VINCI SURE FORM (STAPLE) ×3
STAPLER 60 SUREFORM DVNC (STAPLE) ×2 IMPLANT
STAPLER CANNULA SEAL DVNC XI (STAPLE) ×2 IMPLANT
STAPLER CANNULA SEAL XI (STAPLE) ×3
STAPLER ECHELON POWER CIR 29 (STAPLE) IMPLANT
STAPLER ECHELON POWER CIR 31 (STAPLE) ×3 IMPLANT
STAPLER RELOAD 3.5X60 BLU DVNC (STAPLE)
STAPLER RELOAD 3.5X60 BLUE (STAPLE)
STAPLER RELOAD 4.3X60 GREEN (STAPLE) ×3
STAPLER RELOAD 4.3X60 GRN DVNC (STAPLE) ×2
STOPCOCK 4 WAY LG BORE MALE ST (IV SETS) ×6 IMPLANT
SUT ETHILON 2 0 PS N (SUTURE) IMPLANT
SUT NOVA NAB DX-16 0-1 5-0 T12 (SUTURE) ×6 IMPLANT
SUT PROLENE 2 0 KS (SUTURE) IMPLANT
SUT SILK 2 0 (SUTURE) ×3
SUT SILK 2 0 SH CR/8 (SUTURE) IMPLANT
SUT SILK 2-0 18XBRD TIE 12 (SUTURE) ×2 IMPLANT
SUT SILK 3 0 (SUTURE)
SUT SILK 3 0 SH CR/8 (SUTURE) ×3 IMPLANT
SUT SILK 3-0 18XBRD TIE 12 (SUTURE) IMPLANT
SUT V-LOC BARB 180 2/0GR6 GS22 (SUTURE)
SUT VIC AB 2-0 SH 18 (SUTURE) IMPLANT
SUT VIC AB 2-0 SH 27 (SUTURE)
SUT VIC AB 2-0 SH 27X BRD (SUTURE) IMPLANT
SUT VIC AB 3-0 SH 18 (SUTURE) IMPLANT
SUT VIC AB 4-0 PS2 27 (SUTURE) ×6 IMPLANT
SUT VICRYL 0 UR6 27IN ABS (SUTURE) ×3 IMPLANT
SUTURE V-LC BRB 180 2/0GR6GS22 (SUTURE) IMPLANT
SYR 10ML ECCENTRIC (SYRINGE) ×3 IMPLANT
SYS LAPSCP GELPORT 120MM (MISCELLANEOUS)
SYS WOUND ALEXIS 18CM MED (MISCELLANEOUS)
SYSTEM LAPSCP GELPORT 120MM (MISCELLANEOUS) IMPLANT
SYSTEM WOUND ALEXIS 18CM MED (MISCELLANEOUS) IMPLANT
TOWEL OR 17X26 10 PK STRL BLUE (TOWEL DISPOSABLE) IMPLANT
TOWEL OR NON WOVEN STRL DISP B (DISPOSABLE) ×3 IMPLANT
TRAY FOLEY MTR SLVR 14FR STAT (SET/KITS/TRAYS/PACK) ×3 IMPLANT
TROCAR ADV FIXATION 5X100MM (TROCAR) ×3 IMPLANT
TUBING CONNECTING 10 (TUBING) ×9 IMPLANT
TUBING INSUFFLATION 10FT LAP (TUBING) ×3 IMPLANT
TUBING UROLOGY SET (TUBING) IMPLANT

## 2021-07-10 NOTE — Transfer of Care (Signed)
Immediate Anesthesia Transfer of Care Note  Patient: Alexandra Bright  Procedure(s) Performed: XI ROBOTIC ASSISTED LOWER ANTERIOR RESECTION CYSTOSCOPY WITH FIREFLY INJECTION (Bilateral: Ureter)  Patient Location: PACU  Anesthesia Type:General  Level of Consciousness: awake, alert  and oriented  Airway & Oxygen Therapy: Patient Spontanous Breathing and Patient connected to face mask oxygen  Post-op Assessment: Report given to RN and Post -op Vital signs reviewed and stable  Post vital signs: Reviewed and stable  Last Vitals:  Vitals Value Taken Time  BP 114/72   Temp    Pulse 67 07/10/21 1252  Resp 11 07/10/21 1252  SpO2 100 % 07/10/21 1252  Vitals shown include unvalidated device data.  Last Pain:  Vitals:   07/10/21 0814  TempSrc: Oral         Complications: No notable events documented.

## 2021-07-10 NOTE — Anesthesia Procedure Notes (Signed)
Procedure Name: Intubation Date/Time: 07/10/2021 10:08 AM Performed by: Genelle Bal, CRNA Pre-anesthesia Checklist: Patient identified, Emergency Drugs available, Suction available and Patient being monitored Patient Re-evaluated:Patient Re-evaluated prior to induction Oxygen Delivery Method: Circle system utilized Preoxygenation: Pre-oxygenation with 100% oxygen Induction Type: IV induction Ventilation: Mask ventilation without difficulty Laryngoscope Size: Miller and 2 Grade View: Grade I Tube type: Oral Tube size: 7.0 mm Number of attempts: 1 Airway Equipment and Method: Stylet and Oral airway Placement Confirmation: ETT inserted through vocal cords under direct vision, positive ETCO2 and breath sounds checked- equal and bilateral Secured at: 20 cm Tube secured with: Tape Dental Injury: Teeth and Oropharynx as per pre-operative assessment

## 2021-07-10 NOTE — Consult Note (Signed)
I was asked to inject firefly for ureteral identification during colon surgery.  Berdia has no significant prior GU history.  I have reviewed her records and labs.   I discussed the procedure and risks including bleeding, infection and injury to urinary structures.

## 2021-07-10 NOTE — Interval H&P Note (Signed)
History and Physical Interval Note:  07/10/2021 7:50 AM  Alexandra Bright  has presented today for surgery, with the diagnosis of RECTAL CANCER.  The various methods of treatment have been discussed with the patient and family. After consideration of risks, benefits and other options for treatment, the patient has consented to  Procedure(s): XI ROBOTIC ASSISTED LOWER ANTERIOR RESECTION (N/A) CYSTOSCOPY with FIREFLY INJECTION (N/A) as a surgical intervention.  The patient's history has been reviewed, patient examined, no change in status, stable for surgery.  I have reviewed the patient's chart and labs.  Questions were answered to the patient's satisfaction.     Rosario Adie, MD  Colorectal and [facility]

## 2021-07-10 NOTE — Op Note (Signed)
07/10/2021  12:38 PM  PATIENT:  Alexandra Bright  55 y.o. female  Patient Care Team: Lindell Spar, MD as PCP - General (Internal Medicine) Brien Mates, RN as Oncology Nurse Navigator (Oncology) Derek Jack, MD as Medical Oncologist (Oncology)  PRE-OPERATIVE DIAGNOSIS:  RECTAL CANCER  POST-OPERATIVE DIAGNOSIS:  RECTAL CANCER  PROCEDURE:   XI ROBOTIC ASSISTED LOWER ANTERIOR RESECTION INTRAOPERATIVE ASSESSMENT OF PERFUSION CYSTOSCOPY WITH FIREFLY INJECTION   Surgeon(s): Leighton Ruff, MD Ileana Roup, MD Irine Seal, MD  ASSISTANT: Dr Dema Severin   ANESTHESIA:   local and general  EBL: 50 ml Total I/O In: 1400 [I.V.:1300; IV Piggyback:100] Out: 250 [Urine:200; Blood:50]  Delay start of Pharmacological VTE agent (>24hrs) due to surgical blood loss or risk of bleeding:  no  DRAINS: none   SPECIMEN:  Source of Specimen:  Rectosigmoid colon  DISPOSITION OF SPECIMEN:  PATHOLOGY  COUNTS:  YES  PLAN OF CARE: Admit to inpatient   PATIENT DISPOSITION:  PACU - hemodynamically stable.  INDICATION:    55 y.o. F with proximal rectal mass.  She completed neoadjuvant chemoradiation.  I recommended low anterior resection:  The anatomy & physiology of the digestive tract was discussed.  The pathophysiology was discussed.  Natural history risks without surgery was discussed.   I worked to give an overview of the disease and the frequent need to have multispecialty involvement.  I feel the risks of no intervention will lead to serious problems that outweigh the operative risks; therefore, I recommended a partial colectomy to remove the pathology.  Laparoscopic & open techniques were discussed.   Risks such as bleeding, infection, abscess, leak, reoperation, possible ostomy, hernia, heart attack, death, and other risks were discussed.  I noted a good likelihood this will help address the problem.   Goals of post-operative recovery were discussed as well.    The patient  expressed understanding & wished to proceed with surgery.  OR FINDINGS: proximal rectal mass  Patient had mass in the proximal rectum ~12cm from the anal canal   No obvious metastatic disease on visceral parietal peritoneum or liver.  The anastomosis rests ~8 cm from the anal verge by rigid proctoscopy.  DESCRIPTION:   Informed consent was confirmed.  The patient underwent general anaesthesia without difficulty.  The patient was positioned appropriately.  VTE prevention in place.  The patient's abdomen was clipped, prepped, & draped in a sterile fashion.  Surgical timeout confirmed our plan.  The patient was positioned in reverse Trendelenburg.  Abdominal entry was gained using a Varies needle in the LUQ.  Entry was clean.  I induced carbon dioxide insufflation.  An 51m robotic port was placed in the RUQ.  Camera inspection revealed no injury.  Extra ports were carefully placed under direct laparoscopic visualization.  I laparoscopically reflected the greater omentum and the upper abdomen the small bowel in the upper abdomen. The patient was appropriately positioned and the robot was docked to the patient's left side.  Instruments were placed under direct visualization.    I mobilized the sigmoid colon off of the pelvic sidewall.  I scored the base of peritoneum of the right side of the mesentery of the left colon from the ligament of Treitz to the peritoneal reflection of the mid rectum.  The patient had a mass in the proximal rectum and tattoo noted distally.  I elevated the sigmoid mesentery and enetered into the retro-mesenteric plane.  We were able to identify the left ureter and gonadal vessels. We kept those  posterior within the retroperitoneum and elevated the left colon mesentery off that. I did isolated IMA pedicle but did not ligate it yet.  I continued distally and got into the avascular plane posterior to the mesorectum. This allowed me to help mobilize the rectum as well by freeing the  mesorectum off the sacrum.  I mobilized the peritoneal coverings towards the peritoneal reflection on both the right and left sides of the rectum.  I could see the right and left ureters and stayed away from them.    I skeletonized the inferior mesenteric artery pedicle.  I went down to its takeoff from the aorta.   After confirming the left ureter was out of the way, I went ahead and ligated the inferior mesenteric artery pedicle with bipolar robotic vessel sealer ~2cm above its takeoff from the aorta.  I divided the remaining mesentery with the vessel sealer to the level of the descending/sigmoid junction.  We ensured hemostasis. I skeletonized the mesorectum at the level of the distal tattoo using blunt dissection & bipolar robotic vessel sealer.  This left a distal margin of ~2-3 cm.  The colon easily reached into the pelvis.  I then divided the mid rectum using a robotic green load stapler x2.  We evaluated for vascular perfusion by injecting approximately 3 mL of firefly intravenously.  There was good perfusion of the colon and distal rectum.  At this point the abdomen was inspected for hemostasis.  There was no active bleeding noted.  The robotic instruments were removed and the robot was undocked.  The 12 mm suprapubic port was enlarged into a Pfannenstiel incision and an [place] wound protector was placed.  The colon was brought out through the wound and a pursestring device was placed on the previously created proximal margin.  The colon was transected and sent to pathology for further examination.  A 2-0 Prolene suture was placed through the pursestring device.  This was secured with interrupted 3-0 silk sutures.  Pursestring was then tied tightly around a 31 mm EEA anvil.  This was then placed back into the abdomen and the abdomen was reinsufflated.  An anastomosis was created through the remaining rectum using laparoscopic visualization.  There was no leak when tested with insufflation under  irrigation.  Hemostasis was good at the anastomosis.  The abdomen was irrigated.  We then switched to clean gowns, gloves, instruments and drapes.  The peritoneum of the Pfannenstiel incision was closed using a running 0 Vicryl suture.  The fascia was closed using interrupted #1 Novafil sutures.  The subcutaneous tissue was reapproximated using a running 2-0 Vicryl suture and the skin was closed using a running 4-0 Vicryl subcuticular suture.  Sterile dressing was applied.  The remaining port sites were closed using interrupted 4-0 Vicryl sutures and Dermabond.  Patient was then awakened from anesthesia and sent to the postanesthesia care unit in stable condition.  All counts were correct per operating room staff.  An MD assistant was necessary for tissue manipulation, retraction and positioning due to the complexity of the Bright and hospital policies

## 2021-07-10 NOTE — Op Note (Signed)
Procedure: Cystoscopy with bilateral ureteral catheterization and injection of firefly.  Preop diagnosis: Colon cancer.  Postop diagnosis: Same.  Surgeon: Dr. Irine Seal.  Anesthesia: General.  Specimen: None.  Drain: 3 [place] Foley catheter.  EBL: None.  Complications: None.  Indications: The patient is a 55 year old female with colon cancers to undergo resection and firefly injection was requested to aid ureteral identification.  Procedure: She was taken operating room and was given antibiotics per general surgery.  A general anesthetic was induced.  She was placed in lithotomy position and fitted with PAS hose.  Her perineum and genitalia were prepped with Betadine solution she was draped in usual sterile fashion.  Cystoscopy was performed using the 30 degree lens and 21 [place] scope.  Examination revealed a normal urethra.  The bladder outlet mild trabeculation without mucosal lesions.  Ureteral orifices were unremarkable.  The left ureteral orifice was cannulated with a 5 [place] open-ended catheter and 7 mL of firefly solution was instilled gently without difficulty.  The right ureteral orifice was cannulated with a 5 [place] open-ended catheter and 7 mL of firefly solution was instilled gently without difficulty.  The cystoscope was removed and a 16 French Foley catheter was inserted.  The procedure was then turned over to general surgery.  There were no complications.

## 2021-07-10 NOTE — Anesthesia Postprocedure Evaluation (Signed)
Anesthesia Post Note  Patient: Alexandra Bright  Procedure(s) Performed: XI ROBOTIC ASSISTED LOWER ANTERIOR RESECTION CYSTOSCOPY WITH FIREFLY INJECTION (Bilateral: Ureter)     Patient location during evaluation: PACU Anesthesia Type: General Level of consciousness: awake and alert and oriented Pain management: pain level controlled Vital Signs Assessment: post-procedure vital signs reviewed and stable Respiratory status: spontaneous breathing, nonlabored ventilation and respiratory function stable Cardiovascular status: blood pressure returned to baseline Postop Assessment: no apparent nausea or vomiting Anesthetic complications: no   No notable events documented.  Last Vitals:  Vitals:   07/10/21 1330 07/10/21 1345  BP: (!) 147/75 135/75  Pulse: 70 72  Resp: 15 19  Temp:    SpO2: 100% 100%    Last Pain:  Vitals:   07/10/21 1345  TempSrc:   PainSc: [place]

## 2021-07-10 NOTE — Discharge Instructions (Signed)
SURGERY: POST OP INSTRUCTIONS (Surgery for small bowel obstruction, colon resection, etc)   ######################################################################  EAT Gradually transition to a high fiber diet with a fiber supplement over the next few days after discharge  WALK Walk an hour a day.  Control your pain to do that.    CONTROL PAIN Control pain so that you can walk, sleep, tolerate sneezing/coughing, go up/down stairs.  HAVE A BOWEL MOVEMENT DAILY Keep your bowels regular to avoid problems.  OK to try a laxative to override constipation.  OK to use an antidairrheal to slow down diarrhea.  Call if not better after 2 tries  CALL IF YOU HAVE PROBLEMS/CONCERNS Call if you are still struggling despite following these instructions. Call if you have concerns not answered by these instructions  ######################################################################   DIET Follow a light diet the first few days at home.  Start with a bland diet such as soups, liquids, starchy foods, low fat foods, etc.  If you feel full, bloated, or constipated, stay on a ful liquid or pureed/blenderized diet for a few days until you feel better and no longer constipated. Be sure to drink plenty of fluids every day to avoid getting dehydrated (feeling dizzy, not urinating, etc.). Gradually add a fiber supplement to your diet over the next week.  Gradually get back to a regular solid diet.  Avoid fast food or heavy meals the first week as you are more likely to get nauseated. It is expected for your digestive tract to need a few months to get back to normal.  It is common for your bowel movements and stools to be irregular.  You will have occasional bloating and cramping that should eventually fade away.  Until you are eating solid food normally, off all pain medications, and back to regular activities; your bowels will not be normal. Focus on eating a low-fat, high fiber diet the rest of your life  (See Getting to Good Bowel Health, below).  CARE of your INCISION or WOUND  It is good for closed incisions and even open wounds to be washed every day.  Shower every day.  Short baths are fine.  Wash the incisions and wounds clean with soap & water.    You may leave closed incisions open to air if it is dry.   You may cover the incision with clean gauze & replace it after your daily shower for comfort.  STAPLES: You have skin staples.  Leave them in place & set up an appointment for them to be removed by a surgery office nurse ~10 days after surgery. = 1st week of January 2024    ACTIVITIES as tolerated Start light daily activities --- self-care, walking, climbing stairs-- beginning the day after surgery.  Gradually increase activities as tolerated.  Control your pain to be active.  Stop when you are tired.  Ideally, walk several times a day, eventually an hour a day.   Most people are back to most day-to-day activities in a few weeks.  It takes 4-8 weeks to get back to unrestricted, intense activity. If you can walk 30 minutes without difficulty, it is safe to try more intense activity such as jogging, treadmill, bicycling, low-impact aerobics, swimming, etc. Save the most intensive and strenuous activity for last (Usually 4-8 weeks after surgery) such as sit-ups, heavy lifting, contact sports, etc.  Refrain from any intense heavy lifting or straining until you are off narcotics for pain control.  You will have off days, but things should improve   week-by-week. DO NOT PUSH THROUGH PAIN.  Let pain be your guide: If it hurts to do something, don't do it.  Pain is your body warning you to avoid that activity for another week until the pain goes down. You may drive when you are no longer taking narcotic prescription pain medication, you can comfortably wear a seatbelt, and you can safely make sudden turns/stops to protect yourself without hesitating due to pain. You may have sexual intercourse when it  is comfortable. If it hurts to do something, stop.  MEDICATIONS Take your usually prescribed home medications unless otherwise directed.   Blood thinners:  Usually you can restart any strong blood thinners after the second postoperative day.  It is OK to take aspirin right away.     If you are on strong blood thinners (warfarin/Coumadin, Plavix, Xerelto, Eliquis, Pradaxa, etc), discuss with your surgeon, medicine PCP, and/or cardiologist for instructions on when to restart the blood thinner & if blood monitoring is needed (PT/INR blood check, etc).     PAIN CONTROL Pain after surgery or related to activity is often due to strain/injury to muscle, tendon, nerves and/or incisions.  This pain is usually short-term and will improve in a few months.  To help speed the process of healing and to get back to regular activity more quickly, DO THE FOLLOWING THINGS TOGETHER: Increase activity gradually.  DO NOT PUSH THROUGH PAIN Use Ice and/or Heat Try Gentle Massage and/or Stretching Take over the counter pain medication Take Narcotic prescription pain medication for more severe pain  Good pain control = faster recovery.  It is better to take more medicine to be more active than to stay in bed all day to avoid medications.  Increase activity gradually Avoid heavy lifting at first, then increase to lifting as tolerated over the next 6 weeks. Do not "push through" the pain.  Listen to your body and avoid positions and maneuvers than reproduce the pain.  Wait a few days before trying something more intense Walking an hour a day is encouraged to help your body recover faster and more safely.  Start slowly and stop when getting sore.  If you can walk 30 minutes without stopping or pain, you can try more intense activity (running, jogging, aerobics, cycling, swimming, treadmill, sex, sports, weightlifting, etc.) Remember: If it hurts to do it, then don't do it! Use Ice and/or Heat You will have swelling and  bruising around the incisions.  This will take several weeks to resolve. Ice packs or heating pads (6-8 times a day, 30-60 minutes at a time) will help sooth soreness & bruising. Some people prefer to use ice alone, heat alone, or alternate between ice & heat.  Experiment and see what works best for you.  Consider trying ice for the first few days to help decrease swelling and bruising; then, switch to heat to help relax sore spots and speed recovery. Shower every day.  Short baths are fine.  It feels good!  Keep the incisions and wounds clean with soap & water.   Try Gentle Massage and/or Stretching Massage at the area of pain many times a day Stop if you feel pain - do not overdo it Take over the counter pain medication This helps the muscle and nerve tissues become less irritable and calm down faster Choose ONE of the following over-the-counter anti-inflammatory medications: Acetaminophen 500mg tabs (Tylenol) 1-2 pills with every meal and just before bedtime (avoid if you have liver problems or if you have   acetaminophen in you narcotic prescription) Naproxen 220mg tabs (ex. Aleve, Naprosyn) 1-2 pills twice a day (avoid if you have kidney, stomach, IBD, or bleeding problems) Ibuprofen 200mg tabs (ex. Advil, Motrin) 3-4 pills with every meal and just before bedtime (avoid if you have kidney, stomach, IBD, or bleeding problems) Take with food/snack several times a day as directed for at least 2 weeks to help keep pain / soreness down & more manageable. Take Narcotic prescription pain medication for more severe pain A prescription for strong pain control is often given to you upon discharge (for example: oxycodone/Percocet, hydrocodone/Norco/Vicodin, or tramadol/Ultram) Take your pain medication as prescribed. Be mindful that most narcotic prescriptions contain Tylenol (acetaminophen) as well - avoid taking too much Tylenol. If you are having problems/concerns with the prescription medicine (does  not control pain, nausea, vomiting, rash, itching, etc.), please call us [phone] to see if we need to switch you to a different pain medicine that will work better for you and/or control your side effects better. If you need a refill on your pain medication, you must call the office before 4 pm and on weekdays only.  By federal law, prescriptions for narcotics cannot be called into a pharmacy.  They must be filled out on paper & picked up from our office by the patient or authorized caretaker.  Prescriptions cannot be filled after 4 pm nor on weekends.    WHEN TO CALL US [phone] Severe uncontrolled or worsening pain  Fever over 101 F (38.5 C) Concerns with the incision: Worsening pain, redness, rash/hives, swelling, bleeding, or drainage Reactions / problems with new medications (itching, rash, hives, nausea, etc.) Nausea and/or vomiting Difficulty urinating Difficulty breathing Worsening fatigue, dizziness, lightheadedness, blurred vision Other concerns If you are not getting better after two weeks or are noticing you are getting worse, contact our office [phone] for further advice.  We may need to adjust your medications, re-evaluate you in the office, send you to the emergency room, or see what other things we can do to help. The clinic staff is available to answer your questions during regular business hours (8:30am-5pm).  Please don't hesitate to call and ask to speak to one of our nurses for clinical concerns.    A surgeon from [facility] is always on call at the hospitals 24 hours/day If you have a medical emergency, go to the nearest emergency room or call 911.  FOLLOW UP in our office One the day of your discharge from the hospital (or the next business weekday), please call [facility] to set up or confirm an appointment to see your surgeon in the office for a follow-up appointment.  Usually it is 2-3 weeks after your surgery.   If you  have skin staples at your incision(s), let the office know so we can set up a time in the office for the nurse to remove them (usually around 10 days after surgery). Make sure that you call for appointments the day of discharge (or the next business weekday) from the hospital to ensure a convenient appointment time. IF YOU HAVE DISABILITY OR FAMILY LEAVE FORMS, BRING THEM TO THE OFFICE FOR PROCESSING.  DO NOT GIVE THEM TO YOUR DOCTOR.  [facility] [address], [address], [place], [place]  [address] ? [phone] - Main [phone] - Toll Free,  [phone] - Fax [email or web address]    GETTING TO GOOD BOWEL HEALTH. It is expected for your digestive tract to   need a few months to get back to normal.  It is common for your bowel movements and stools to be irregular.  You will have occasional bloating and cramping that should eventually fade away.  Until you are eating solid food normally, off all pain medications, and back to regular activities; your bowels will not be normal.   Avoiding constipation The goal: ONE SOFT BOWEL MOVEMENT A DAY!    Drink plenty of fluids.  Choose water first. TAKE A FIBER SUPPLEMENT EVERY DAY THE REST OF YOUR LIFE During your first week back home, gradually add back a fiber supplement every day Experiment which form you can tolerate.   There are many forms such as powders, tablets, wafers, gummies, etc Psyllium bran (Metamucil), methylcellulose (Citrucel), Miralax or Glycolax, Benefiber, Flax Seed.  Adjust the dose week-by-week (1/2 dose/day to 6 doses a day) until you are moving your bowels 1-2 times a day.  Cut back the dose or try a different fiber product if it is giving you problems such as diarrhea or bloating. Sometimes a laxative is needed to help jump-start bowels if constipated until the fiber supplement can help regulate your bowels.  If you are tolerating eating & you are farting, it is okay to try a gentle  laxative such as double dose MiraLax, prune juice, or Milk of Magnesia.  Avoid using laxatives too often. Stool softeners can sometimes help counteract the constipating effects of narcotic pain medicines.  It can also cause diarrhea, so avoid using for too long. If you are still constipated despite taking fiber daily, eating solids, and a few doses of laxatives, call our office. Controlling diarrhea Try drinking liquids and eating bland foods for a few days to avoid stressing your intestines further. Avoid dairy products (especially milk & ice cream) for a short time.  The intestines often can lose the ability to digest lactose when stressed. Avoid foods that cause gassiness or bloating.  Typical foods include beans and other legumes, cabbage, broccoli, and dairy foods.  Avoid greasy, spicy, fast foods.  Every person has some sensitivity to other foods, so listen to your body and avoid those foods that trigger problems for you. Probiotics (such as active yogurt, Align, etc) may help repopulate the intestines and colon with normal bacteria and calm down a sensitive digestive tract Adding a fiber supplement gradually can help thicken stools by absorbing excess fluid and retrain the intestines to act more normally.  Slowly increase the dose over a few weeks.  Too much fiber too soon can backfire and cause cramping & bloating. It is okay to try and slow down diarrhea with a few doses of antidiarrheal medicines.   Bismuth subsalicylate (ex. Kayopectate, Pepto Bismol) for a few doses can help control diarrhea.  Avoid if pregnant.   Loperamide (Imodium) can slow down diarrhea.  Start with one tablet (2mg) first.  Avoid if you are having fevers or severe pain.  ILEOSTOMY PATIENTS WILL HAVE CHRONIC DIARRHEA since their colon is not in use.    Drink plenty of liquids.  You will need to drink even more glasses of water/liquid a day to avoid getting dehydrated. Record output from your ileostomy.  Expect to empty  the bag every 3-4 hours at first.  Most people with a permanent ileostomy empty their bag 4-6 times at the least.   Use antidiarrheal medicine (especially Imodium) several times a day to avoid getting dehydrated.  Start with a dose at bedtime & breakfast.  Adjust up or   down as needed.  Increase antidiarrheal medications as directed to avoid emptying the bag more than 8 times a day (every 3 hours). Work with your wound ostomy nurse to learn care for your ostomy.  See ostomy care instructions. TROUBLESHOOTING IRREGULAR BOWELS 1) Start with a soft & bland diet. No spicy, greasy, or fried foods.  2) Avoid gluten/wheat or dairy products from diet to see if symptoms improve. 3) Miralax 17gm or flax seed mixed in 8oz. water or juice-daily. May use 2-4 times a day as needed. 4) Gas-X, Phazyme, etc. as needed for gas & bloating.  5) Prilosec (omeprazole) over-the-counter as needed 6)  Consider probiotics (Align, Activa, etc) to help calm the bowels down  Call your doctor if you are getting worse or not getting better.  Sometimes further testing (cultures, endoscopy, X-ray studies, CT scans, bloodwork, etc.) may be needed to help diagnose and treat the cause of the diarrhea. [facility] [address], [address], [place], [place]  [address] [phone] - Main.    [phone]  - Toll Free.   [phone] - Fax [email or web address]   ###############################   #######################################################  Ostomy Support Information  You've heard that people get along just fine with only one of their eyes, or one of their lungs, or one of their kidneys. But you also know that you have only one intestine and only one bladder, and that leaves you feeling awfully empty, both physically and emotionally: You think no other people go around without part of their intestine with the ends of their intestines sticking out through their abdominal walls.    YOU ARE NOT ALONE.  There are nearly three quarters of a million people in the [place] who have an ostomy; people who have had surgery to remove all or part of their colons or bladders.   There is even a national association, the [employer] with over 350 local affiliated support groups that are organized by volunteers who provide peer support and counseling. [employer] has a toll free telephone num-ber, 800-826-0826 and an educational, interactive website, www.ostomy.org   An ostomy is an opening in the belly (abdominal wall) made by surgery. Ostomates are people who have had this procedure. The opening (stoma) allows the kidney or bowel to grdischarge waste. An external pouch covers the stoma to collect waste. Pouches are are a simple bag and are odor free. Different companies have disposable or reusable pouches to fit one's lifestyle. An ostomy can either be temporary or permanent.   THERE ARE THREE MAIN TYPES OF OSTOMIES Colostomy. A colostomy is a surgically created opening in the large intestine (colon). Ileostomy. An ileostomy is a surgically created opening in the small intestine. Urostomy. A urostomy is a surgically created opening to divert urine away from the bladder.  OSTOMY Care  The following guidelines will make care of your colostomy easier. Keep this information close by for quick reference.  Helpful DIET hints Eat a well-balanced diet including vegetables and fresh fruits. Eat on a regular schedule.  Drink at least 6 to 8 glasses of fluids daily. Eat slowly in a relaxed atmosphere. Chew your food thoroughly. Avoid chewing gum, smoking, and drinking from a straw. This will help decrease the amount of air you swallow, which may help reduce gas. Eating yogurt or drinking buttermilk may help reduce gas.  To control gas at night, do not eat after 8 p.m. This will give your bowel time to quiet down before you go   to bed.  If gas is a problem, you can purchase  Beano. Sprinkle Beano on the first bite of food before eating to reduce gas. It has no flavor and should not change the taste of your food. You can buy Beano over the counter at your local drugstore.  Foods like fish, onions, garlic, broccoli, asparagus, and cabbage produce odor. Although your pouch is odor-proof, if you eat these foods you may notice a stronger odor when emptying your pouch. If this is a concern, you may want to limit these foods in your diet.  If you have an ileostomy, you will have chronic diarrhea & need to drink more liquids to avoid getting dehydrated.  Consider antidiarrheal medicine like imodium (loperamide) or Lomotil to help slow down bowel movements / diarrhea into your ileostomy bag.  GETTING TO GOOD BOWEL HEALTH WITH AN ILEOSTOMY    With the colon bypassed & not in use, you will have small bowel diarrhea.   It is important to thicken & slow your bowel movements down.   The goal: 4-6 small BOWEL MOVEMENTS A DAY It is important to drink plenty of liquids to avoid getting dehydrated  CONTROLLING ILEOSTOMY DIARRHEA  TAKE A FIBER SUPPLEMENT (FiberCon or Benefiner soluble fiber) twice a day - to thicken stools by absorbing excess fluid and retrain the intestines to act more normally.  Slowly increase the dose over a few weeks.  Too much fiber too soon can backfire and cause cramping & bloating.  TAKE AN IRON SUPPLEMENT twice a day to naturally constipate your bowels.  Usually ferrous sulfate 325mg twice a day)  TAKE ANTI-DIARRHEAL MEDICINES: Loperamide (Imodium) can slow down diarrhea.  Start with two tablets (= 4mg) first and then try one tablet every 6 hours.  Can go up to 2 pills four times day (8 pills of 2mg max) Avoid if you are having fevers or severe pain.  If you are not better or start feeling worse, stop all medicines and call your doctor for advice LoMotil (Diphenoxylate / Atropine) is another medicine that can constipate & slow down bowel moevements Pepto  Bismol (bismuth) can gently thicken bowels as well  If diarrhea is worse,: drink plenty of liquids and try simpler foods for a few days to avoid stressing your intestines further. Avoid dairy products (especially milk & ice cream) for a short time.  The intestines often can lose the ability to digest lactose when stressed. Avoid foods that cause gassiness or bloating.  Typical foods include beans and other legumes, cabbage, broccoli, and dairy foods.  Every person has some sensitivity to other foods, so listen to our body and avoid those foods that trigger problems for you.Call your doctor if you are getting worse or not better.  Sometimes further testing (cultures, endoscopy, X-ray studies, bloodwork, etc) may be needed to help diagnose and treat the cause of the diarrhea. Take extra anti-diarrheal medicines (maximum is 8 pills of 2mg loperamide a day)   Tips for POUCHING an OSTOMY   Changing Your Pouch The best time to change your pouch is in the morning, before eating or drinking anything. Your stoma can function at any time, but it will function more after eating or drinking.   Applying the pouching system  Place all your equipment close at hand before removing your pouch.  Wash your hands.  Stand or sit in front of a mirror. Use the position that works best for you. Remember that you must keep the skin around the stoma   wrinkle-free for a good seal.  Gently remove the used pouch (1-piece system) or the pouch and old wafer (2-piece system). Empty the pouch into the toilet. Save the closure clip to use again.  Wash the stoma itself and the skin around the stoma. Your stoma may bleed a little when being washed. This is normal. Rinse and pat dry. You may use a wash cloth or soft paper towels (like Bounty), mild soap (like Dial, Safeguard, or Ivory), and water. Avoid soaps that contain perfumes or lotions.  For a new pouch (1-piece system) or a new wafer (2-piece system), measure your  stoma using the stoma guide in each box of supplies.  Trace the shape of your stoma onto the back of the new pouch or the back of the new wafer. Cut out the opening. Remove the paper backing and set it aside.  Optional: Apply a skin barrier powder to surrounding skin if it is irritated (bare or weeping), and dust off the excess. Optional: Apply a skin-prep wipe (such as Skin Prep or All-Kare) to the skin around the stoma, and let it dry. Do not apply this solution if the skin is irritated (red, tender, or broken) or if you have shaved around the stoma. Optional: Apply a skin barrier paste (such as Stomahesive, Coloplast, or Premium) around the opening cut in the back of the pouch or wafer. Allow it to dry for 30 to 60 seconds.  Hold the pouch (1-piece system) or wafer (2-piece system) with the sticky side toward your body. Make sure the skin around the stoma is wrinkle-free. Center the opening on the stoma, then press firmly to your abdomen (Fig. 4). Look in the mirror to check if you are placing the pouch, or wafer, in the right position. For a 2-piece system, snap the pouch onto the wafer. Make sure it snaps into place securely.  Place your hand over the stoma and the pouch or wafer for about 30 seconds. The heat from your hand can help the pouch or wafer stick to your skin.  Add deodorant (such as Super Banish or Nullo) to your pouch. Other options include food extracts such as vanilla oil and peppermint extract. Add about 10 drops of the deodorant to the pouch. Then apply the closure clamp. Note: Do not use toxic  chemicals or commercial cleaning agents in your pouch. These substances may harm the stoma.  Optional: For extra seal, apply tape to all 4 sides around the pouch or wafer, as if you were framing a picture. You may use any brand of medical adhesive tape. Change your pouch every 5 to 7 days. Change it immediately if a leak occurs.  Wash your hands afterwards.  If you are wearing a  2-piece system, you may use 2 new pouches per week and alternate them. Rinse the pouch with mild soap and warm water and hang it to dry for the next day. Apply the fresh pouch. Alternate the 2 pouches like this for a week. After a week, change the wafer and begin with 2 new pouches. Place the old pouches in a plastic bag, and put them in the trash.   LIVING WITH AN OSTOMY  Emptying Your Pouch Empty your pouch when it is one-third full (of urine, stool, and/or gas). If you wait until your pouch is fuller than this, it will be more difficult to empty and more noticeable. When you empty your pouch, either put toilet paper in the toilet bowl first, or flush the   toilet while you empty the pouch. This will reduce splashing. You can empty the pouch between your legs or to one side while sitting, or while standing or stooping. If you have a 2-piece system, you can snap off the pouch to empty it. Remember that your stoma may function during this time. If you wish to rinse your pouch after you empty it, a turkey baster can be helpful. When using a baster, squirt water up into the pouch through the opening at the bottom. With a 2-piece system, you can snap off the pouch to rinse it. After rinsing  your pouch, empty it into the toilet. When rinsing your pouch at home, put a few granules of Dreft soap in the rinse water. This helps lubricate and freshen your pouch. The inside of your pouch can be sprayed with non-stick cooking oil (Pam spray). This may help reduce stool sticking to the inside of the pouch.  Bathing You may shower or bathe with your pouch on or off. Remember that your stoma may function during this time.  The materials you use to wash your stoma and the skin around it should be clean, but they do not need to be sterile.  Wearing Your Pouch During hot weather, or if you perspire a lot in general, wear a cover over your pouch. This may prevent a rash on your skin under the pouch. Pouch covers are  sold at ostomy supply stores. Wear the pouch inside your underwear for better support. Watch your weight. Any gain or loss of 10 to 15 pounds or more can change the way your pouch fits.  Going Away From Home A collapsible cup (like those that come in travel kits) or a soft plastic squirt bottle with a pull-up top (like a travel bottle for shampoo) can be used for rinsing your pouch when you are away from home. Tilt the opening of the pouch at an upward angle when using a cup to rinse.  Carry wet wipes or extra tissues to use in public bathrooms.  Carry an extra pouching system with you at all times.  Never keep ostomy supplies in the glove compartment of your car. Extreme heat or cold can damage the skin barriers and adhesive wafers on the pouch.  When you travel, carry your ostomy supplies with you at all times. Keep them within easy reach. Do not pack ostomy supplies in baggage that will be checked or otherwise separated from you, because your baggage might be lost. If you're traveling out of the country, it is helpful to have a letter stating that you are carrying ostomy supplies as a medical necessity.  If you need ostomy supplies while traveling, look in the yellow pages of the telephone book under "Surgical Supplies." Or call the local ostomy organization to find out where supplies are available.  Do not let your ostomy supplies get low. Always order new pouches before you use the last one.  Reducing Odor Limit foods such as broccoli, cabbage, onions, fish, and garlic in your diet to help reduce odor. Each time you empty your pouch, carefully clean the opening of the pouch, both inside and outside, with toilet paper. Rinse your pouch 1 or 2 times daily after you empty it (see directions for emptying your pouch and going away from home). Add deodorant (such as Super Banish or Nullo) to your pouch. Use air deodorizers in your bathroom. Do not add aspirin to your pouch. Even though  aspirin can help prevent odor, it   could cause ulcers on your stoma.  When to call the doctor Call the doctor if you have any of the following symptoms: Purple, black, or white stoma Severe cramps lasting more than 6 hours Severe watery discharge from the stoma lasting more than 6 hours No output from the colostomy for 3 days Excessive bleeding from your stoma Swelling of your stoma to more than 1/2-inch larger than usual Pulling inward of your stoma below skin level Severe skin irritation or deep ulcers Bulging or other changes in your abdomen  When to call your ostomy nurse Call your ostomy/enterostomal therapy (WOCN) nurse if any of the following occurs: Frequent leaking of your pouching system Change in size or appearance of your stoma, causing discomfort or problems with your pouch Skin rash or rawness Weight gain or loss that causes problems with your pouch     FREQUENTLY ASKED QUESTIONS   Why haven't you met any of these folks who have an ostomy?  Well, maybe you have! You just did not recognize them because an ostomy doesn't show. It can be kept secret if you wish. Why, maybe some of your best friends, office associates or neighbors have an ostomy ... you never can tell. People facing ostomy surgery have many quality-of-life questions like: Will you bulge? Smell? Make noises? Will you feel waste leaving your body? Will you be a captive of the toilet? Will you starve? Be a social outcast? Get/stay married? Have babies? Easily bathe, go swimming, bend over?  OK, let's look at what you can expect:   Will you bulge?  Remember, without part of the intestine or bladder, and its contents, you should have a flatter tummy than before. You can expect to wear, with little exception, what you wore before surgery ... and this in-cludes tight clothing and bathing suits.   Will you smell?  Today, thanks to modern odor proof pouching systems, you can walk into an ostomy support group  meeting and not smell anything that is foul or offensive. And, for those with an ileostomy or colostomy who are concerned about odor when emptying their pouch, there are in-pouch deodorants that can be used to eliminate any waste odors that may exist.   Will you make noises?  Everyone produces gas, especially if they are an air-swallower. But intestinal sounds that occur from time to time are no differ-ent than a gurgling tummy, and quite often your clothing will muffle any sounds.   Will you feel the waste discharges?  For those with a colostomy or ileostomy there might be a slight pressure when waste leaves your body, but understand that the intestines have no nerve endings, so there will be no unpleasant sensations. Those with a urostomy will probably be unaware of any kidney drainage.   Will you be a captive of the toilet?  Immediately post-op you will spend more time in the bathroom than you will after your body recovers from surgery. Every person is different, but on average those with an ileostomy or urostomy may empty their pouches 4 to 6 times a day; a little  less if you have a colostomy. The average wear time between pouch system changes is 3 to 5 days and the changing process should take less than 30 minutes.   Will I need to be on a special diet? Most people return to their normal diet when they have recovered from surgery. Be sure to chew your food well, eat a well-balanced diet and drink plenty of fluids. If   you experience problems with a certain food, wait a couple of weeks and try it again.  Will there be odor and noises? Pouching systems are designed to be odor-proof or odor-resistant. There are deodorants that can be used in the pouch. Medications are also available to help reduce odor. Limit gas-producing foods and carbonated beverages. You will experience less gas and fewer noises as you heal from surgery.  How much time will it take to care for my ostomy? At first, you may  spend a lot of time learning about your ostomy and how to take care of it. As you become more comfortable and skilled at changing the pouching system, it will take very little time to care for it.   Will I be able to return to work? People with ostomies can perform most jobs. As soon as you have healed from surgery, you should be able to return to work. Heavy lifting (more than 10 pounds) may be discouraged.   What about intimacy? Sexual relationships and intimacy are important and fulfilling aspects of your life. They should continue after ostomy surgery. Intimacy-related concerns should be discussed openly between you and your partner.   Can I wear regular clothing? You do not need to wear special clothing. Ostomy pouches are fairly flat and barely noticeable. Elastic undergarments will not hurt the stoma or prevent the ostomy from functioning.   Can I participate in sports? An ostomy should not limit your involvement in sports. Many people with ostomies are runners, skiers, swimmers or participate in other active lifestyles. Talk with your caregiver first before doing heavy physical activity.  Will you starve?  Not if you follow doctor's orders at each stage of your post-op adjustment. There is no such thing as an "ostomy diet". Some people with an ostomy will be able to eat and tolerate anything; others may find diffi-culty with some foods. Each person is an individual and must determine, by trial, what is best for them. A good practice for all is to drink plenty of water.   Will you be a social outcast?  Have you met anyone who has an ostomy and is a social outcast? Why should you be the first? Only your attitude and self image will effect how you are treated. No confi-dent person is an outcast.    PROFESSIONAL HELP   Resources are available if you need help or have questions about your ostomy.   Specially trained nurses called Wound, Ostomy Continence Nurses (WOCN) are available for  consultation in most major medical centers.  Consider getting an ostomy consult at an outpatient ostomy clinic.   [place] has an Ostomy Clinic run by an WOCN ostomy nurse at the [facility].  [phone]. [facility] can help set up an appointment   The United Ostomy Association (UOA) is a group made up of many local chapters throughout the [place]. These local groups hold meetings and provide support to prospective and existing ostomates. They sponsor educational events and have qualified visitors to make personal or telephone visits. Contact the UOA for the chapter nearest you and for other educational publications.  More detailed information can be found in Colostomy Guide, a publication of the United Ostomy Association (UOA). Contact UOA at 1-800-826-0826 or visit their web site at www.uoaa.org. The website contains links to other sites, suppliers and resources.  [facility] Secure Start Services: Start at the website to enlist for support.  Your Wound Ostomy (WOCN) nurse may have started this   process. [email or web address]://[email or web address].[employer].com/en/securestart Secure Start services are designed to support people as they live their lives with an ostomy or neurogenic bladder. Enrolling is easy and at no cost to the patient. We realize that each person's needs and life journey are different. Through Secure Start services, we want to help people live their life, their way.  #######################################################  

## 2021-07-11 ENCOUNTER — Encounter (HOSPITAL_COMMUNITY): Payer: Self-pay | Admitting: General Surgery

## 2021-07-11 LAB — CBC
HCT: 31 % — ABNORMAL LOW (ref 36.0–46.0)
Hemoglobin: 9.9 g/dL — ABNORMAL LOW (ref 12.0–15.0)
MCH: 34.1 pg — ABNORMAL HIGH (ref 26.0–34.0)
MCHC: 31.9 g/dL (ref 30.0–36.0)
MCV: 106.9 fL — ABNORMAL HIGH (ref 80.0–100.0)
Platelets: 228 10*3/uL (ref 150–400)
RBC: 2.9 MIL/uL — ABNORMAL LOW (ref 3.87–5.11)
RDW: 17 % — ABNORMAL HIGH (ref 11.5–15.5)
WBC: 13.5 10*3/uL — ABNORMAL HIGH (ref 4.0–10.5)
nRBC: 0 % (ref 0.0–0.2)

## 2021-07-11 LAB — BASIC METABOLIC PANEL
Anion gap: 10 (ref 5–15)
BUN: <5 mg/dL — ABNORMAL LOW (ref 6–20)
CO2: 23 mmol/L (ref 22–32)
Calcium: 9.1 mg/dL (ref 8.9–10.3)
Chloride: 106 mmol/L (ref 98–111)
Creatinine, Ser: 0.85 mg/dL (ref 0.44–1.00)
GFR, Estimated: >60 mL/min (ref >60)
Glucose, Bld: 180 mg/dL — ABNORMAL HIGH (ref 70–99)
Potassium: 4.3 mmol/L (ref 3.5–5.1)
Sodium: 139 mmol/L (ref 135–145)

## 2021-07-11 NOTE — Progress Notes (Signed)
1 Day Post-Op   Subjective/Chief Complaint: No complaints. Feels good and having bm's   Objective: Vital signs in last 24 hours: Temp:  [97.5 F (36.4 C)-99 F (37.2 C)] 98.5 F (36.9 C) (08/20 0948) Pulse Rate:  [60-80] 60 (08/20 0948) Resp:  [14-19] 16 (08/20 0602) BP: (92-147)/(59-93) 92/59 (08/20 0948) SpO2:  [96 %-100 %] 99 % (08/20 0948) Last BM Date: 07/09/21  Intake/Output from previous day: 08/19 0701 - 08/20 0700 In: 2559.7 [P.O.:360; I.V.:2099.7; IV Piggyback:100] Out: 2450 [Urine:2400; Blood:50] Intake/Output this shift: Total I/O In: 360 [P.O.:360] Out: -   General appearance: alert and cooperative Resp: clear to auscultation bilaterally Cardio: regular rate and rhythm GI: soft, minimal tenderness. Incisions look good. Good bs  Lab Results:  Recent Labs    07/11/21 0440  WBC 13.5*  HGB 9.9*  HCT 31.0*  PLT 228   BMET Recent Labs    07/11/21 0440  NA 139  K 4.3  CL 106  CO2 23  GLUCOSE 180*  BUN <5*  CREATININE 0.85  CALCIUM 9.1   PT/INR No results for input(s): LABPROT, INR in the last 72 hours. ABG No results for input(s): PHART, HCO3 in the last 72 hours.  Invalid input(s): PCO2, PO2  Studies/Results: No results found.  Anti-infectives: Anti-infectives (From admission, onward)    Start     Dose/Rate Route Frequency Ordered Stop   07/10/21 2200  cefoTEtan (CEFOTAN) 2 g in sodium chloride 0.9 % 100 mL IVPB        2 g 200 mL/hr over 30 Minutes Intravenous Every 12 hours 07/10/21 1424 07/10/21 2219   07/10/21 0815  cefoTEtan (CEFOTAN) 2 g in sodium chloride 0.9 % 100 mL IVPB        2 g 200 mL/hr over 30 Minutes Intravenous On call to O.R. 07/10/21 0803 07/10/21 1045       Assessment/Plan: s/p Procedure(s): XI ROBOTIC ASSISTED LOWER ANTERIOR RESECTION (N/A) CYSTOSCOPY WITH FIREFLY INJECTION (Bilateral) Advance diet per colon protocol POD 1 D/c foley Ambulate Await path  LOS: 1 day    Alexandra Bright III 07/11/2021

## 2021-07-11 NOTE — Progress Notes (Signed)
Pharmacy Brief Note - Alvimopan (Entereg)  The standing order set for alvimopan (Entereg) now includes an automatic order to discontinue the drug after the patient has had a bowel movement. The change was approved by the [place] and the Medical Executive Committee.   This patient has had bowel movements documented by nursing, noted 6-7 loose stools with "particles. Recent hx of change in bowel habits from constipation to loose stools PTA for surgery for rectal Ca  Alvimopan has been discontinued. If there are questions, please contact the pharmacy at [phone].   Thank you-   Minda Ditto PharmD [facility] [phone] 07/11/2021, 9:00 AM

## 2021-07-12 LAB — CBC
HCT: 30.1 % — ABNORMAL LOW (ref 36.0–46.0)
Hemoglobin: 9.6 g/dL — ABNORMAL LOW (ref 12.0–15.0)
MCH: 33.8 pg (ref 26.0–34.0)
MCHC: 31.9 g/dL (ref 30.0–36.0)
MCV: 106 fL — ABNORMAL HIGH (ref 80.0–100.0)
Platelets: 209 10*3/uL (ref 150–400)
RBC: 2.84 MIL/uL — ABNORMAL LOW (ref 3.87–5.11)
RDW: 17 % — ABNORMAL HIGH (ref 11.5–15.5)
WBC: 9.2 10*3/uL (ref 4.0–10.5)
nRBC: 0 % (ref 0.0–0.2)

## 2021-07-12 LAB — BASIC METABOLIC PANEL
Anion gap: 7 (ref 5–15)
BUN: <5 mg/dL — ABNORMAL LOW (ref 6–20)
CO2: 26 mmol/L (ref 22–32)
Calcium: 8.8 mg/dL — ABNORMAL LOW (ref 8.9–10.3)
Chloride: 106 mmol/L (ref 98–111)
Creatinine, Ser: 0.75 mg/dL (ref 0.44–1.00)
GFR, Estimated: >60 mL/min (ref >60)
Glucose, Bld: 72 mg/dL (ref 70–99)
Potassium: 3.6 mmol/L (ref 3.5–5.1)
Sodium: 139 mmol/L (ref 135–145)

## 2021-07-12 MED ORDER — TRAMADOL HCL 50 MG PO TABS: 50.0000 mg | ORAL_TABLET | Freq: Four times a day (QID) | ORAL | 0 refills | Status: AC | PRN

## 2021-07-12 NOTE — Progress Notes (Signed)
2 Days Post-Op   Subjective/Chief Complaint: No complaints. Feels good and continues having bm's. Tolerating grits in addition to cream based foods. No nausea, vomiting or distention. Minimal abdominal discomfort - only at [place].   Objective: Vital signs in last 24 hours: Temp:  [97.6 F (36.4 C)-98.5 F (36.9 C)] 97.6 F (36.4 C) (08/21 0541) Pulse Rate:  [60-79] 66 (08/21 0541) Resp:  [17-18] 17 (08/21 0541) BP: (92-119)/(59-84) 94/76 (08/21 0541) SpO2:  [95 %-100 %] 99 % (08/21 0541) Last BM Date: 07/09/21  Intake/Output from previous day: 08/20 0701 - 08/21 0700 In: 1256.2 [P.O.:540; I.V.:716.2] Out: -  Intake/Output this shift: No intake/output data recorded.  General appearance: alert and cooperative Resp: clear to auscultation bilaterally Cardio: regular rate and rhythm GI: soft, minimal appropriate tenderness. Incisions look good.  Lab Results:  Recent Labs    07/11/21 0440 07/12/21 0345  WBC 13.5* 9.2  HGB 9.9* 9.6*  HCT 31.0* 30.1*  PLT 228 209   BMET Recent Labs    07/11/21 0440 07/12/21 0345  NA 139 139  K 4.3 3.6  CL 106 106  CO2 23 26  GLUCOSE 180* 72  BUN <5* <5*  CREATININE 0.85 0.75  CALCIUM 9.1 8.8*   PT/INR No results for input(s): LABPROT, INR in the last 72 hours. ABG No results for input(s): PHART, HCO3 in the last 72 hours.  Invalid input(s): PCO2, PO2  Studies/Results: No results found.  Anti-infectives: Anti-infectives (From admission, onward)    Start     Dose/Rate Route Frequency Ordered Stop   07/10/21 2200  cefoTEtan (CEFOTAN) 2 g in sodium chloride 0.9 % 100 mL IVPB        2 g 200 mL/hr over 30 Minutes Intravenous Every 12 hours 07/10/21 1424 07/10/21 2219   07/10/21 0815  cefoTEtan (CEFOTAN) 2 g in sodium chloride 0.9 % 100 mL IVPB        2 g 200 mL/hr over 30 Minutes Intravenous On call to O.R. 07/10/21 0803 07/10/21 1045       Assessment/Plan: s/p Procedure(s): XI ROBOTIC ASSISTED LOWER ANTERIOR  RESECTION (N/A) CYSTOSCOPY WITH FIREFLY INJECTION (Bilateral) Advance diet per colon protocol POD 2  Soft diet as tolerated Ambulate She is comfortable with and stable for discharge later this morning/early afternoon if tolerating soft diet without issue  Path pending  LOS: 2 days    Ileana Roup 07/12/2021

## 2021-07-12 NOTE — Discharge Summary (Signed)
Patient ID: Alexandra Bright MRN: [id number] DOB/AGE: [birth year] 55 y.o.  Admit date: 07/10/2021 Discharge date: 07/12/2021  Discharge Diagnoses Patient Active Problem List   Diagnosis Date Noted   Elevated liver enzymes 06/17/2021   Rectal pain 06/17/2021   Diarrhea 06/17/2021   Rectal cancer ([place]) 04/08/2021   Adenocarcinoma of colon ([place]) 02/26/2021   Eczema 02/26/2021   IBS (irritable bowel syndrome) 02/05/2021   Rectal bleeding 02/05/2021   Hypertension 11/28/2020   Hyperlipidemia 11/27/2020   Chronic constipation 11/27/2020   Anxiety 11/27/2020   Mucus in stool 11/27/2020   Idiopathic peripheral neuropathy 11/27/2020   Vitamin D insufficiency 07/03/2018   Depression 04/25/2018    Consultants None  Procedures Robotic LAR Intraoperative assessment of perfusion Cysto/firefly (Dr. Jeffie Pollock)  Hospital Course: She was admitted postoperatively where she did great. She began having spontaneous bowel fxn on POD#1 and her diet advanced. She was mobilizing well on her own, pain well controlled on oral analgesics and tolerating a diet. She was comfortable with and stable for discharge home on POD#2. General postoperative expectations and restrictions were reviewed. Her and her husband's questions were answered to their satisfaction. Pathology pending.   Allergies as of 07/12/2021   No Known Allergies      Medication List     STOP taking these medications    oxyCODONE-acetaminophen 5-325 MG tablet Commonly known as: PERCOCET/ROXICET       TAKE these medications    buPROPion 200 MG 12 hr tablet Commonly known as: WELLBUTRIN SR Take 1 tablet (200 mg total) by mouth 2 (two) times daily.   diphenhydrAMINE 25 MG tablet Commonly known as: BENADRYL Take 25 mg by mouth every 6 (six) hours as needed for allergies.   gabapentin 100 MG capsule Commonly known as: NEURONTIN Take 1 capsule (100 mg total) by mouth 2 (two) times daily. What changed: when to take this    hydrocortisone cream 0.5 % Apply 1 application topically 2 (two) times daily.   losartan 50 MG tablet Commonly known as: COZAAR Take 1 tablet by mouth once daily   Melatonin 10 MG Tabs Take 20 mg by mouth at bedtime as needed (sleep).   multivitamin with minerals tablet Take 1 tablet by mouth in the morning. Women's Multivitamin   sennosides-docusate sodium 8.6-50 MG tablet Commonly known as: SENOKOT-S Take 1 tablet by mouth daily as needed for constipation.   simethicone 80 MG chewable tablet Commonly known as: MYLICON Chew 80 mg by mouth every 6 (six) hours as needed for flatulence.   traMADol 50 MG tablet Commonly known as: Ultram Take 1 tablet (50 mg total) by mouth every 6 (six) hours as needed for up to 5 days (postop pain not controlled with tylenol and ibuprofen first).          Follow-up Information     Leighton Ruff, MD. Schedule an appointment as soon as possible for a visit in 2 week(s).   Specialties: General Surgery, Colon and Rectal Surgery Contact information: [address]  [place] [address] [phone]                Eunique Balik, MD [facility] Surgery Use [email or web address] to contact on call provider

## 2021-07-12 NOTE — Progress Notes (Signed)
Reviewed written d/c instructions w pt and husband and all questions answered. They both verbalized understanding. D/c per w/c w all belongings in stable condition.

## 2021-07-14 ENCOUNTER — Other Ambulatory Visit (HOSPITAL_COMMUNITY): Payer: Self-pay

## 2021-07-17 LAB — SURGICAL PATHOLOGY

## 2021-08-06 ENCOUNTER — Inpatient Hospital Stay (HOSPITAL_COMMUNITY): Payer: 59 | Attending: Hematology | Admitting: Hematology

## 2021-08-06 ENCOUNTER — Other Ambulatory Visit: Payer: Self-pay

## 2021-08-06 VITALS — BP 122/80 | HR 86 | Temp 97.6°F | Resp 18 | Wt 129.2 lb

## 2021-08-06 DIAGNOSIS — Z8041 Family history of malignant neoplasm of ovary: Secondary | ICD-10-CM | POA: Diagnosis not present

## 2021-08-06 DIAGNOSIS — Z809 Family history of malignant neoplasm, unspecified: Secondary | ICD-10-CM | POA: Insufficient documentation

## 2021-08-06 DIAGNOSIS — C189 Malignant neoplasm of colon, unspecified: Secondary | ICD-10-CM | POA: Diagnosis not present

## 2021-08-06 DIAGNOSIS — Z833 Family history of diabetes mellitus: Secondary | ICD-10-CM | POA: Diagnosis not present

## 2021-08-06 DIAGNOSIS — Z5111 Encounter for antineoplastic chemotherapy: Secondary | ICD-10-CM | POA: Diagnosis not present

## 2021-08-06 DIAGNOSIS — R109 Unspecified abdominal pain: Secondary | ICD-10-CM | POA: Insufficient documentation

## 2021-08-06 DIAGNOSIS — Z79899 Other long term (current) drug therapy: Secondary | ICD-10-CM | POA: Insufficient documentation

## 2021-08-06 DIAGNOSIS — I10 Essential (primary) hypertension: Secondary | ICD-10-CM | POA: Insufficient documentation

## 2021-08-06 DIAGNOSIS — C20 Malignant neoplasm of rectum: Secondary | ICD-10-CM | POA: Diagnosis not present

## 2021-08-06 DIAGNOSIS — C19 Malignant neoplasm of rectosigmoid junction: Secondary | ICD-10-CM | POA: Insufficient documentation

## 2021-08-06 DIAGNOSIS — G629 Polyneuropathy, unspecified: Secondary | ICD-10-CM | POA: Diagnosis not present

## 2021-08-06 DIAGNOSIS — K6289 Other specified diseases of anus and rectum: Secondary | ICD-10-CM | POA: Insufficient documentation

## 2021-08-06 DIAGNOSIS — F419 Anxiety disorder, unspecified: Secondary | ICD-10-CM | POA: Insufficient documentation

## 2021-08-06 DIAGNOSIS — Z87891 Personal history of nicotine dependence: Secondary | ICD-10-CM | POA: Insufficient documentation

## 2021-08-06 DIAGNOSIS — Z8049 Family history of malignant neoplasm of other genital organs: Secondary | ICD-10-CM | POA: Diagnosis not present

## 2021-08-06 MED ORDER — TRAMADOL HCL 50 MG PO TABS: 50.0000 mg | ORAL_TABLET | Freq: Two times a day (BID) | ORAL | 0 refills | Status: DC | PRN

## 2021-08-06 NOTE — Patient Instructions (Addendum)
Alexandra Bright at [facility] Discharge Instructions  You were seen today by Dr. Delton Coombes. He went over your recent results. You will be scheduled to have a port placed and to start treatment as soon as is possible. Dr. Delton Coombes will see you back in at the start of your treatment for labs and follow up.   Thank you for choosing [facility] at [facility] to provide your oncology and hematology care.  To afford each patient quality time with our provider, please arrive at least 15 minutes before your scheduled appointment time.   If you have a lab appointment with the [facility] please come in thru the Main Entrance and check in at the main information desk  You need to re-schedule your appointment should you arrive 10 or more minutes late.  We strive to give you quality time with our providers, and arriving late affects you and other patients whose appointments are after yours.  Also, if you no show three or more times for appointments you may be dismissed from the clinic at the providers discretion.     Again, thank you for choosing [facility]  Our hope is that these requests will decrease the amount of time that you wait before being seen by our physicians.       _____________________________________________________________  Should you have questions after your visit to [facility], please contact our office at [phone] between the hours of 8:00 a.m. and 4:30 p.m.  Voicemails left after 4:00 p.m. will not be returned until the following business day.  For prescription refill requests, have your pharmacy contact our office and allow 72 hours.    Cancer Center Support Programs:   > Cancer Support Group  2nd Tuesday of the month 1pm-2pm, Journey Room

## 2021-08-06 NOTE — Progress Notes (Signed)
Alexandra Bright, Alexandra Bright   CLINIC:  Medical Oncology/Hematology  PCP:  Lindell Spar, MD [address] / [place] [address] [phone]   REASON FOR VISIT:  Follow-up for sigmoid adenocarcinoma  PRIOR THERAPY: none  NGS Results: not done  CURRENT THERAPY: FOLFOX every 2 weeks x 4 months  BRIEF ONCOLOGIC HISTORY:  Oncology History  Rectal cancer ([place])  04/08/2021 Initial Diagnosis   Rectal cancer ([place])   04/20/2021 - 04/20/2021 Chemotherapy          08/17/2021 -  Chemotherapy    Patient is on Treatment Plan: COLORECTAL FOLFOX Q14D X 4 MONTHS         CANCER STAGING: Cancer Staging Rectal cancer ([place]) Staging form: Colon and Rectum, AJCC 8th Edition - Clinical stage from 04/08/2021: Stage IIIB (cT3, cN1b, cM0) - Unsigned   INTERVAL HISTORY:  [name], a 55 y.o. female, returns for routine follow-up of her sigmoid adenocarcinoma. Alexandra Bright was last seen on 07/02/2021.   Today she reports feeling good. She reports back pain for which she takes tramadol once a day. She reports peeling on her hands and feet. She denies nausea.   REVIEW OF SYSTEMS:  Review of Systems  Constitutional:  Negative for appetite change and fatigue.  Gastrointestinal:  Positive for abdominal pain (5/10 stomach) and diarrhea. Negative for nausea.  Musculoskeletal:  Negative for back pain.  Skin:        peeling  All other systems reviewed and are negative.  PAST MEDICAL/SURGICAL HISTORY:  Past Medical History:  Diagnosis Date   Anxiety    Colon cancer ([place]) 01/2021   Constipation    GERD (gastroesophageal reflux disease)    Hypertension    Pre-diabetes    Past Surgical History:  Procedure Laterality Date   BIOPSY  02/18/2021   Procedure: BIOPSY;  Surgeon: Harvel Quale, MD;  [facility]: [facility];  Service: Gastroenterology;;   FACIAL FRACTURE SURGERY  1991   from [facility]  02/18/2021    Procedure: FLEXIBLE SIGMOIDOSCOPY;  Surgeon: Harvel Quale, MD;  Location: [facility];  Service: Gastroenterology;;   POLYPECTOMY  02/18/2021   Procedure: POLYPECTOMY INTESTINAL;  Surgeon: Harvel Quale, MD;  Location: [facility];  Service: Gastroenterology;;   SUBMUCOSAL TATTOO INJECTION  02/18/2021   Procedure: SUBMUCOSAL TATTOO INJECTION;  Surgeon: Harvel Quale, MD;  Location: [facility];  Service: Gastroenterology;;   XI ROBOTIC ASSISTED LOWER ANTERIOR RESECTION N/A 07/10/2021   Procedure: XI ROBOTIC ASSISTED LOWER ANTERIOR RESECTION;  Surgeon: Leighton Ruff, MD;  Location: [facility];  Service: General;  Laterality: N/A;    SOCIAL HISTORY:  Social History   Socioeconomic History   Marital status: Married    Spouse name: Not on file   Number of children: Not on file   Years of education: Not on file   Highest education level: Not on file  Occupational History   Not on file  Tobacco Use   Smoking status: Former    Packs/day: 0.50    Years: 40.00    Pack years: 20.00    Types: Cigarettes    Quit date: 2020    Years since quitting: 2.7   Smokeless tobacco: Never  Vaping Use   Vaping Use: Never used  Substance and Sexual Activity   Alcohol use: Not Currently   Drug use: Never   Sexual activity: Not Currently  Other Topics Concern   Not  on file  Social History Narrative   Not on file   Social Determinants of Health   Financial Resource Strain: Low Risk    Difficulty of Paying Living Expenses: Not hard at all  Food Insecurity: No Food Insecurity   Worried About Charity fundraiser in the Last Year: Never true   Arboriculturist in the Last Year: Never true  Transportation Needs: No Transportation Needs   Lack of Transportation (Medical): No   Lack of Transportation (Non-Medical): No  Physical Activity: Insufficiently Active   Days of Exercise per Week: 3 days   Minutes of Exercise per Session: 20 min  Stress: No Stress  Concern Present   Feeling of Stress : Only a little  Social Connections: Engineer, building services of Communication with Friends and Family: More than three times a week   Frequency of Social Gatherings with Friends and Family: More than three times a week   Attends Religious Services: More than 4 times per year   Active Member of [employer] or Organizations: Yes   Attends Music therapist: More than 4 times per year   Marital Status: Married  Human resources officer Violence: Not At Risk   Fear of Current or Ex-Partner: No   Emotionally Abused: No   Physically Abused: No   Sexually Abused: No    FAMILY HISTORY:  Family History  Problem Relation Age of Onset   Diabetes Sister    Cancer Maternal Aunt    Ovarian cancer Cousin    Ovarian cancer Paternal Aunt    Prostate cancer Cousin     CURRENT MEDICATIONS:  Current Outpatient Medications  Medication Sig Dispense Refill   buPROPion (WELLBUTRIN SR) 200 MG 12 hr tablet Take 1 tablet (200 mg total) by mouth 2 (two) times daily. 60 tablet 5   gabapentin (NEURONTIN) 100 MG capsule Take 1 capsule (100 mg total) by mouth 2 (two) times daily. (Patient taking differently: Take 100 mg by mouth in the morning.) 60 capsule 5   hydrocortisone cream 0.5 % Apply 1 application topically 2 (two) times daily. 30 g 0   losartan (COZAAR) 50 MG tablet Take 1 tablet by mouth once daily (Patient taking differently: Take 50 mg by mouth daily.) 90 tablet 0   Multiple Vitamins-Minerals (MULTIVITAMIN WITH MINERALS) tablet Take 1 tablet by mouth in the morning. Women's Multivitamin     diphenhydrAMINE (BENADRYL) 25 MG tablet Take 25 mg by mouth every 6 (six) hours as needed for allergies. (Patient not taking: Reported on 08/06/2021)     Melatonin 10 MG TABS Take 20 mg by mouth at bedtime as needed (sleep). (Patient not taking: Reported on 08/06/2021)     sennosides-docusate sodium (SENOKOT-S) 8.6-50 MG tablet Take 1 tablet by mouth daily as needed for  constipation. (Patient not taking: Reported on 08/06/2021)     simethicone (MYLICON) 80 MG chewable tablet Chew 80 mg by mouth every 6 (six) hours as needed for flatulence. (Patient not taking: Reported on 08/06/2021)     traMADol (ULTRAM) 50 MG tablet Take 50 mg by mouth every 6 (six) hours as needed. (Patient not taking: Reported on 08/06/2021)     No current facility-administered medications for this visit.    ALLERGIES:  No Known Allergies  PHYSICAL EXAM:  Performance status (ECOG): 1 - Symptomatic but completely ambulatory  Vitals:   08/06/21 1104  BP: 122/80  Pulse: 86  Resp: 18  Temp: 97.6 F (36.4 C)  SpO2: 98%  Wt Readings from Last 3 Encounters:  08/06/21 129 lb 3 oz (58.6 kg)  07/10/21 128 lb 12.8 oz (58.4 kg)  07/02/21 128 lb 12.8 oz (58.4 kg)   Physical Exam Vitals reviewed.  Constitutional:      Appearance: Normal appearance.  Cardiovascular:     Rate and Rhythm: Normal rate and regular rhythm.     Pulses: Normal pulses.     Heart sounds: Normal heart sounds.  Pulmonary:     Effort: Pulmonary effort is normal.     Breath sounds: Normal breath sounds.  Abdominal:     General: A surgical scar is present.     Palpations: Abdomen is soft. There is no hepatomegaly, splenomegaly or mass.     Tenderness: There is no abdominal tenderness.  Neurological:     General: No focal deficit present.     Mental Status: She is alert and oriented to person, place, and time.  Psychiatric:        Mood and Affect: Mood normal.        Behavior: Behavior normal.     LABORATORY DATA:  I have reviewed the labs as listed.  CBC Latest Ref Rng & Units 07/12/2021 07/11/2021 06/29/2021  WBC 4.0 - 10.5 K/uL 9.2 13.5(H) 10.2  Hemoglobin 12.0 - 15.0 g/dL 9.6(L) 9.9(L) 12.3  Hematocrit 36.0 - 46.0 % 30.1(L) 31.0(L) 37.8  Platelets 150 - 400 K/uL 209 228 344   CMP Latest Ref Rng & Units 07/12/2021 07/11/2021 06/29/2021  Glucose 70 - 99 mg/dL 72 180(H) 116(H)  BUN 6 - 20 mg/dL <5(L) <5(L)  11  Creatinine 0.44 - 1.00 mg/dL 0.75 0.85 0.94  Sodium 135 - 145 mmol/L 139 139 137  Potassium 3.5 - 5.1 mmol/L 3.6 4.3 4.1  Chloride 98 - 111 mmol/L 106 106 105  CO2 22 - 32 mmol/L _0 Calcium 8.9 - 10.3 mg/dL 8.8(L) 9.1 9.4  Total Protein 6.5 - 8.1 g/dL - - 7.5  Total Bilirubin 0.3 - 1.2 mg/dL - - 0.5  Alkaline Phos 38 - 126 U/L - - 92  AST 15 - 41 U/L - - 19  ALT 0 - 44 U/L - - 23    DIAGNOSTIC IMAGING:  I have independently reviewed the scans and discussed with the patient. No results found.   ASSESSMENT:  1.  Rectosigmoid sigmoid adenocarcinoma: - Presentation with the bleeding per rectum for the last 5 to 6 months and constipation. - Colonoscopy on 02/18/2021 showed ulcerated partially obstructing mass found from 12-17 cm from the anal verge, circumferential measuring 5 cm in length. - Biopsy consistent with adenocarcinoma. - CT CAP on 03/06/2021 with circumferential wall thickening, hyperenhancement and fat stranding about the rectosigmoid junction, mass measuring approximately 6 cm in length.  No evidence of lymphadenopathy or metastatic disease. - CEA on 02/19/2021 was 1.8. - MRI of the pelvis on 04/06/2021 shows high rectal T3c/T4AN1 malignancy. - Chemoradiation therapy with Xeloda from 04/22/2021 through 06/01/2021. - CT CAP on 06/29/2021 showed improvement in the rectosigmoid thickening at the site of malignancy.  No clear adenopathy.  No evidence of metastatic disease. - Robotic assisted LAR on 07/10/2021 with pathology showing 2 cm grade 2, margins negative, 1/18 lymph nodes positive, no tumor deposits, positive treatment effect, no perineural invasion, no lymphovascular invasion, [id number], MMR preserved   2.  Social/family history: - She worked as a Secretary/administrator and recently stopped working.  She quit smoking in October 2021, smoked 1 pack/day for 40 years. - Maternal  aunt had lung cancer and was a non-smoker.  Paternal aunt had cancer, type not known to the  patient.   PLAN:  1.  T3c/T4N1 rectosigmoid  adenocarcinoma: - She underwent robotic assisted LAR on 07/10/2021 by Dr. Marcello Moores. - She is recovering well from surgery.  She reports occasional pain in the rectal area prior to bowel movement. - Denies any tingling or numbness in the extremities. - Reviewed pathology report with the patient in detail. - Discussed the next treatment plan with 4 months of FOLFOX chemotherapy. - Discussed side effects in detail. - She will require completion colonoscopy in the future.  She would like to wait until after finishing chemotherapy. - We will tentatively schedule her in the next 1 to 2 weeks for first cycle of FOLFOX.    2.  Rectal pain: - She has occasional pain in the rectum prior to bowel movement.  Continue tramadol as needed.   3.  Peripheral neuropathy: - She developed neuropathy with numbness in the toes and fingertips after COVID-vaccine in December 2021. - She is continuing gabapentin daily.  She does not report any tingling or numbness at this time.  She was told that she could stop gabapentin and see if the symptoms have resolved.   Orders placed this encounter:  No orders of the defined types were placed in this encounter.    Derek Jack, MD [place] [phone]   I, Thana Ates, am acting as a scribe for Dr. Derek Jack.  I, Derek Jack MD, have reviewed the above documentation for accuracy and completeness, and I agree with the above.

## 2021-08-06 NOTE — Progress Notes (Signed)
DISCONTINUE ON PATHWAY REGIMEN - Colorectal     A cycle is every 7 days:     Capecitabine   **Always confirm dose/schedule in your pharmacy ordering system**  REASON: Continuation Of Treatment PRIOR TREATMENT: [id number]: Capecitabine 825 mg/m2  PO BID (Monday - Friday) for the Duration of Radiation Therapy TREATMENT RESPONSE: Partial Response (PR)  START ON PATHWAY REGIMEN - Colorectal     A cycle is every 14 days:     Oxaliplatin      Leucovorin      Fluorouracil      Fluorouracil   **Always confirm dose/schedule in your pharmacy ordering system**  Patient Characteristics: Preoperative or Nonsurgical Candidate (Clinical Staging), Rectal, cT3 - cT4, cN0 or Any cT, cN+ Tumor Location: Rectal Therapeutic Status: Preoperative or Nonsurgical Candidate (Clinical Staging) AJCC T Category: cT4a AJCC N Category: cN1b AJCC M Category: cM0 AJCC 8 Stage Grouping: IIIB Intent of Therapy: Curative Intent, Discussed with Patient

## 2021-08-08 ENCOUNTER — Encounter (HOSPITAL_COMMUNITY): Payer: Self-pay | Admitting: Hematology

## 2021-08-09 NOTE — Patient Instructions (Addendum)
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  You will be treated in the clinic every 2 weeks for a total of 4 months with a combination of chemotherapy drugs.  Those drugs are oxaliplatin, leucovorin, and fluorouracil (5FU).  The intent of treatment is cure.  You will see the doctor regularly throughout treatment.  We will obtain blood work from you prior to every treatment and monitor your results to make sure it is safe to give your treatment. The doctor monitors your response to treatment by the way you are feeling, your blood work, and by obtaining scans periodically.  There will be wait times while you are here for treatment.  It will take about 30 minutes to 1 hour for your lab work to result.  Then there will be wait times while pharmacy mixes your medications.    Medications you will receive in the clinic prior to your chemotherapy medications:  Aloxi:  ALOXI is used in adults to help prevent nausea and vomiting that happens with certain chemotherapy drugs.  Aloxi is a long acting medication, and will remain in your system for about two days.   Dexamethasone:  This is a steroid given prior to chemotherapy to help prevent allergic reactions; it may also help prevent and control nausea and diarrhea.     Oxaliplatin (Eloxatin)  About This Drug  Oxaliplatin is used to treat cancer. It is given in the vein (IV).  It takes two hours to infuse.  Possible Side Effects   Bone marrow suppression. This is a decrease in the number of white blood cells, red blood cells, and platelets. This may raise your risk of infection, make you tired and weak (fatigue), and raise your risk of bleeding.   Tiredness   Soreness of the mouth and throat. You may have red areas, white patches, or sores that hurt.   Nausea and vomiting (throwing up)   Diarrhea (loose bowel movements)   Changes in your liver function   Effects on the nerves called  peripheral neuropathy. You may feel numbness, tingling, or pain in your hands and feet, and may be worse in cold temperatures. It may be hard for you to button your clothes, open jars, or walk as usual. The effect on the nerves may get worse with more doses of the drug. These effects get better in some people after the drug is stopped but it does not get better in all people  Note: Each of the side effects above was reported in 40% or greater of patients treated with oxaliplatin. Not all possible side effects are included above.   Warnings and Precautions   Allergic reactions, including anaphylaxis, which may be life-threatening are rare but may happen in some patients. Signs of allergic reaction to this drug may be swelling of the face, feeling like your tongue or throat are swelling, trouble breathing, rash, itching, fever, chills, feeling dizzy, and/or feeling that your heart is beating in a fast or not normal way. If this happens, do not take another dose of this drug. You should get urgent medical treatment.   Inflammation (swelling) of the lungs, which may be life-threatening. You may have a dry cough or trouble breathing.   Effects on the nerves (neuropathy) may resolve within 14 days, or it may persist beyond 14 days.   Severe decrease in white blood cells when combined with the chemotherapy agents 5-fluorouracil and leucovorin. This may be life-threatening.   Severe changes  in your liver function   Abnormal heart beat and/or EKG, which can be life-threatening   Rhabdomyolysis- damage to your muscles which may release proteins in your blood and affect how your kidneys work, which can be life-threatening. You may have severe muscle weakness and/or pain, or dark urine.  Important Information   This drug may impair your ability to drive or use machinery. Talk to your doctor and/or nurse about precautions you may need to take.   This drug may be present in the saliva, tears, sweat,  urine, stool, vomit, semen, and vaginal secretions. Talk to your doctor and/or your nurse about the necessary precautions to take during this time.  * The effects on the nerves can be aggravated by exposure to cold. Avoid cold beverages, use of ice and make sure you cover your skin and dress warmly prior to being exposed to cold temperatures while you are receiving treatment with oxaliplatin*   Treating Side Effects   Manage tiredness by pacing your activities for the day.   Be sure to include periods of rest between energy-draining activities.   To decrease the risk of infection, wash your hands regularly.   Avoid close contact with people who have a cold, the flu, or other infections.  Take your temperature as your doctor or nurse tells you, and whenever you feel like you may have a fever.   To help decrease the risk of bleeding, use a soft toothbrush. Check with your nurse before using dental floss.   Be very careful when using knives or tools.   Use an electric shaver instead of a razor.   Drink plenty of fluids (a minimum of eight glasses per day is recommended).   Mouth care is very important. Your mouth care should consist of routine, gentle cleaning of your teeth or dentures and rinsing your mouth with a mixture of 1/2 teaspoon of salt in 8 ounces of water or 1/2 teaspoon of baking soda in 8 ounces of water. This should be done at least after each meal and at bedtime.   If you have mouth sores, avoid mouthwash that has alcohol. Also avoid alcohol and smoking because they can bother your mouth and throat.   To help with nausea and vomiting, eat small, frequent meals instead of three large meals a day. Choose foods and drinks that are at room temperature. Ask your nurse or doctor about other helpful tips and medicine that is available to help stop or lessen these symptoms.   If you throw up or have loose bowel movements, you should drink more fluids so that you do not become  dehydrated (lack of water in the body from losing too much fluid).   If you have diarrhea, eat low-fiber foods that are high in protein and calories and avoid foods that can irritate your digestive tracts or lead to cramping.   Ask your nurse or doctor about medicine that can lessen or stop your diarrhea.   If you have numbness and tingling in your hands and feet, be careful when cooking, walking, and handling sharp objects and hot liquids.   Do not drink cold drinks or use ice in beverages. Drink fluids at room temperature or warmer, and drink through a straw.   Wear gloves to touch cold objects, and wear warm clothing and cover you skin during cold weather.   Food and Drug Interactions   There are no known interactions of oxaliplatin with food and other medications.   This drug may  interact with other medicines. Tell your doctor and pharmacist about all the prescription and over-the-counter medicines and dietary supplements (vitamins, minerals, herbs and others) that you are taking at this time. Also, check with your doctor or pharmacist before starting any new prescription or over-the-counter medicines, or dietary supplements to make sure that there are no interactions   When to Call the Doctor  Call your doctor or nurse if you have any of these symptoms and/or any new or unusual symptoms:   Fever of 100.4 F (38 C) or higher   Chills   Tiredness that interferes with your daily activities   Feeling dizzy or lightheaded   Easy bleeding or bruising   Feeling that your heart is beating in a fast or not normal way (palpitations)   Pain in your chest   Dry cough   Trouble breathing   Pain in your mouth or throat that makes it hard to eat or drink   Nausea that stops you from eating or drinking and/or is not relieved by prescribed medicines   Throwing up   Diarrhea, 4 times in one day or diarrhea with lack of strength or a feeling of being dizzy   Numbness, tingling,  or pain in your hands and feet   Signs of possible liver problems: dark urine, pale bowel movements, bad stomach pain, feeling very tired and weak, unusual itching, or yellowing of the eyes or skin   Signs of rhabdomyolysis: decreased urine, very dark urine, muscle pain in the shoulders, thighs, or lower back; muscle weakness or trouble moving arms and legs   Signs of allergic reaction: swelling of the face, feeling like your tongue or throat are swelling, trouble breathing, rash, itching, fever, chills, feeling dizzy, and/or feeling that your heart is beating in a fast or not normal way. If this happens, call 911 for emergency care.   If you think you may be pregnant  Reproduction Warnings   Pregnancy warning: This drug may have harmful effects on the unborn baby. Women of childbearing potential should use effective methods of birth control during your cancer treatment. Let your doctor know right away if you think you may be pregnant or may have impregnated your partner.   Breastfeeding warning: It is not known if this drug passes into breast milk. For this reason, women should talk to their doctor about the risks and benefits of breastfeeding during treatment with this drug because this drug may enter the breast milk and cause harm to a breastfeeding baby.   Fertility warning: Human fertility studies have not been done with this drug. Talk with your doctor or nurse if you plan to have children. Ask for information on sperm or egg banking.   Leucovorin Calcium  About This Drug  Leucovorin is a vitamin. It is used in combination with other cancer fighting drugs such as 5-fluorouracil and methotrexate. Leucovorin is given in the vein (IV).  This drug runs at the same time as the oxaliplatin and takes 2 hours to infuse.   Possible Side Effects  Rash and itching  Note: Leucovorin by itself has very few side effects. Other side effects you may have can be caused by the other drugs you are  taking, such as 5-fluorouracil.   Warnings and Precautions   Allergic reactions, including anaphylaxis are rare but may happen in some patients. Signs of allergic reaction to this drug may be swelling of the face, feeling like your tongue or throat are swelling, trouble breathing, rash, itching,  fever, chills, feeling dizzy, and/or feeling that your heart is beating in a fast or not normal way. If this happens, do not take another dose of this drug. You should get urgent medical treatment.  Food and Drug Interactions   There are no known interactions of leucovorin with food.   This drug may interact with other medicines. Tell your doctor and pharmacist about all the prescription and over-the-counter medicines and dietary supplements (vitamins, minerals, herbs and others) that you are taking at this time.   Also, check with your doctor or pharmacist before starting any new prescription or over-the-counter medicines, or dietary supplements to make sure that there are no interactions.   When to Call the Doctor  Call your doctor or nurse if you have any of these symptoms and/or any new or unusual symptoms:   A new rash or a rash that is not relieved by prescribed medicines   Signs of allergic reaction: swelling of the face, feeling like your tongue or throat are swelling, trouble breathing, rash, itching, fever, chills, feeling dizzy, and/or feeling that your heart is beating in a fast or not normal way. If this happens, call 911 for emergency care.   If you think you may be pregnant   Reproduction Warnings   Pregnancy warning: It is not known if this drug may harm an unborn child. For this reason, be sure to talk with your doctor if you are pregnant or planning to become pregnant while receiving this drug. Let your doctor know right away if you think you may be pregnant   Breastfeeding warning: It is not known if this drug passes into breast milk. For this reason, women should talk to  their doctor about the risks and benefits of breastfeeding during treatment with this drug because this drug may enter the breast milk and cause harm to a breastfeeding baby.   Fertility warning: Human fertility studies have not been done with this drug. Talk with your doctor or nurse if you plan to have children. Ask for information on sperm or egg banking.   5-Fluorouracil (Adrucil; 5FU)  About This Drug  Fluorouracil is used to treat cancer. It is given in the vein (IV). It is given as an IV push from a syringe and also as a continuous infusion given via an ambulatory pump (a pump you take home and wear for a specified amount of time).  Possible Side Effects   Bone marrow suppression. This is a decrease in the number of white blood cells, red blood cells, and platelets. This may raise your risk of infection, make you tired and weak (fatigue), and raise your risk of bleeding   Changes in the tissue of the heart and/or heart attack. Some changes may happen that can cause your heart to have less ability to pump blood.   Blurred vision or other changes in eyesight   Nausea and throwing up (vomiting)   Diarrhea (loose bowel movements)   Ulcers - sores that may cause pain or bleeding in your digestive tract, which includes your mouth, esophagus, stomach, small/large intestines and rectum   Soreness of the mouth and throat. You may have red areas, white patches, or sores that hurt.   Allergic reactions, including anaphylaxis are rare but may happen in some patients. Signs of allergic reaction to this drug may be swelling of the face, feeling like your tongue or throat are swelling, trouble breathing, rash, itching, fever, chills, feeling dizzy, and/or feeling that your heart is beating  in a fast or not normal way. If this happens, do not take another dose of this drug. You should get urgent medical treatment.   Sensitivity to light (photosensitivity). Photosensitivity means that you may  become more sensitive to the sun and/or light. You may get a skin rash/reaction if you are in the sun or are exposed to sun lamps and tanning beds. Your eyes may water more, mostly in bright light.   Changes in your nail color, nail loss and/or brittle nail   Darkening of the skin, or changes to the color of your skin and/or veins used for infusion   Rash, dry skin, or itching  Note: Not all possible side effects are included above.  Warnings and Precautions   Hand-and-foot syndrome. The palms of your hands or soles of your feet may tingle, become numb, painful, swollen, or red.   Changes in your central nervous system can happen. The central nervous system is made up of your brain and spinal cord. You could feel extreme tiredness, agitation, confusion, hallucinations (see or hear things that are not there), trouble understanding or speaking, loss of control of your bowels or bladder, eyesight changes, numbness or lack of strength to your arms, legs, face, or body, or coma. If you start to have any of these symptoms let your doctor know right away.   Side effects of this drug may be unexpectedly severe in some patients  Note: Some of the side effects above are very rare. If you have concerns and/or questions, please discuss them with your medical team.   Important Information   This drug may be present in the saliva, tears, sweat, urine, stool, vomit, semen, and vaginal secretions. Talk to your doctor and/or your nurse about the necessary precautions to take during this time.   Treating Side Effects   Manage tiredness by pacing your activities for the day.   Be sure to include periods of rest between energy-draining activities.   To help decrease the risk of infections, wash your hands regularly.   Avoid close contact with people who have a cold, the flu, or other infections.   Take your temperature as your doctor or nurse tells you, and whenever you feel like you may have a  fever.   Use a soft toothbrush. Check with your nurse before using dental floss.   Be very careful when using knives or tools.   Use an electric shaver instead of a razor.   If you have a nose bleed, sit with your head tipped slightly forward. Apply pressure by lightly pinching the bridge of your nose between your thumb and forefinger. Call your doctor if you feel dizzy or faint or if the bleeding doesn't stop after 10 to 15 minutes.   Drink plenty of fluids (a minimum of eight glasses per day is recommended).   If you throw up or have loose bowel movements, you should drink more fluids so that you do not  become dehydrated (lack of water in the body from losing too much fluid).   To help with nausea and vomiting, eat small, frequent meals instead of three large meals a day. Choose foods and drinks that are at room temperature. Ask your nurse or doctor about other helpful tips and medicine that is available to help, stop, or lessen these symptoms.   If you have diarrhea, eat low-fiber foods that are high in protein and calories and avoid foods that can irritate your digestive tracts or lead to cramping.  Ask your nurse or doctor about medicine that can lessen or stop your diarrhea.   Mouth care is very important. Your mouth care should consist of routine, gentle cleaning of your teeth or dentures and rinsing your mouth with a mixture of 1/2 teaspoon of salt in 8 ounces of water or 1/2 teaspoon of baking soda in 8 ounces of water. This should be done at least after each meal and at bedtime.   If you have mouth sores, avoid mouthwash that has alcohol. Also avoid alcohol and smoking because they can bother your mouth and throat.   Keeping your nails moisturized may help with brittleness.   To help with itching, moisturize your skin several times day.   Use sunscreen with SPF 30 or higher when you are outdoors even for a short time. Cover up when you are out in the sun. Wear wide-brimmed  hats, long-sleeved shirts, and pants. Keep your neck, chest, and back covered. Wear dark sun glasses when in the sun or bright lights.   If you get a rash do not put anything on it unless your doctor or nurse says you may. Keep the area around the rash clean and dry. Ask your doctor for medicine if your rash bothers you.   Keeping your pain under control is important to your well-being. Please tell your doctor or nurse if you are experiencing pain.   Food and Drug Interactions   There are no known interactions of fluorouracil with food.   Check with your doctor or pharmacist about all other prescription medicines and over-the-counter medicines and dietary supplements (vitamins, minerals, herbs and others) you are taking before starting this medicine as there are known drug interactions with 5-fluoroucacil. Also, check with your doctor or pharmacist before starting any new prescription or over-the-counter medicines, or dietary supplements to make sure that there are no interactions.  When to Call the Doctor  Call your doctor or nurse if you have any of these symptoms and/or any new or unusual symptoms:   Fever of 100.4 F (38 C) or higher   Chills   Easy bleeding or bruising   Nose bleed that doesn't stop bleeding after 10-15 minutes   Trouble breathing   Feeling dizzy or lightheaded   Feeling that your heart is beating in a fast or not normal way (palpitations)   Chest pain or symptoms of a heart attack. Most heart attacks involve pain in the center of the chest that lasts more than a few minutes. The pain may go away and come back or it can be constant. It can feel like pressure, squeezing, fullness, or pain. Sometimes pain is felt in one or both arms, the back, neck, jaw, or stomach. If any of these symptoms last 2 minutes, call 911.   Confusion and/or agitation   Hallucinations   Trouble understanding or speaking   Loss of control of bowels or bladder   Blurry vision or  changes in your eyesight   Headache that does not go away   Numbness or lack of strength to your arms, legs, face, or body   Nausea that stops you from eating or drinking and/or is not relieved by prescribed medicines   Throwing up more than 3 times a day   Diarrhea, 4 times in one day or diarrhea with lack of strength or a feeling of being dizzy   Pain in your mouth or throat that makes it hard to eat or drink   Pain along the digestive  tract - especially if worse after eating   Blood in your vomit (bright red or coffee-ground) and/or stools (bright red, or black/tarry)   Coughing up blood   Tiredness that interferes with your daily activities   Painful, red, or swollen areas on your hands or feet or around your nails   A new rash or a rash that is not relieved by prescribed medicines   Develop sensitivity to sunlight/light   Numbness and/or tingling of your hands and/or feet   Signs of allergic reaction: swelling of the face, feeling like your tongue or throat are swelling, trouble breathing, rash, itching, fever, chills, feeling dizzy, and/or feeling that your heart is beating in a fast or not normal way. If this happens, call 911 for emergency care.   If you think you are pregnant or may have impregnated your partner  Reproduction Warnings   Pregnancy warning: This drug may have harmful effects on the unborn baby. Women of child bearing potential should use effective methods of birth control during your cancer treatment and 3 months after treatment. Men with female partners of childbearing potential should use effective methods of birth control during your cancer treatment and for 3 months after your cancer treatment. Let your doctor know right away if you think you may be pregnant or may have impregnated your partner.   Breastfeeding warning: It is not known if this drug passes into breast milk. For this reason, Women should not breastfeed during treatment because this drug  could enter the breast milk and cause harm to a breastfeeding baby.   Fertility warning: In men and women both, this drug may affect your ability to have children in the future. Talk with your doctor or nurse if you plan to have children. Ask for information on sperm or egg banking.   SELF CARE ACTIVITIES WHILE RECEIVING CHEMOTHERAPY:  Hydration Increase your fluid intake 48 hours prior to treatment and drink at least 8 to 12 cups (64 ounces) of water/decaffeinated beverages per day after treatment. You can still have your cup of coffee or soda but these beverages do not count as part of your 8 to 12 cups that you need to drink daily. No alcohol intake.  Medications Continue taking your normal prescription medication as prescribed.  If you start any new herbal or new supplements please let us know first to make sure it is safe.  Mouth Care Have teeth cleaned professionally before starting treatment. Keep dentures and partial plates clean. Use soft toothbrush and do not use mouthwashes that contain alcohol. Biotene is a good mouthwash that is available at most pharmacies or may be ordered by calling [phone]. Use warm salt water gargles (1 teaspoon salt per 1 quart warm water) before and after meals and at bedtime. If you need dental work, please let the doctor know before you go for your appointment so that we can coordinate the best possible time for you in regards to your chemo regimen. You need to also let your dentist know that you are actively taking chemo. We may need to do labs prior to your dental appointment.  Skin Care Always use sunscreen that has not expired and with SPF (Sun Protection Factor) of 50 or higher. Wear hats to protect your head from the sun. Remember to use sunscreen on your hands, ears, face, & feet.  Use good moisturizing lotions such as udder cream, eucerin, or even Vaseline. Some chemotherapies can cause dry skin, color changes in your skin and nails.  Avoid  long, hot showers or baths. Use gentle, fragrance-free soaps and laundry detergent. Use moisturizers, preferably creams or ointments rather than lotions because the thicker consistency is better at preventing skin dehydration. Apply the cream or ointment within 15 minutes of showering. Reapply moisturizer at night, and moisturize your hands every time after you wash them.  Hair Loss (if your doctor says your hair will fall out)  If your doctor says that your hair is likely to fall out, decide before you begin chemo whether you want to wear a wig. You may want to shop before treatment to match your hair color. Hats, turbans, and scarves can also camouflage hair loss, although some people prefer to leave their heads uncovered. If you go bare-headed outdoors, be sure to use sunscreen on your scalp. Cut your hair short. It eases the inconvenience of shedding lots of hair, but it also can reduce the emotional impact of watching your hair fall out. Don't perm or color your hair during chemotherapy. Those chemical treatments are already damaging to hair and can enhance hair loss. Once your chemo treatments are done and your hair has grown back, it's OK to resume dyeing or perming hair.  With chemotherapy, hair loss is almost always temporary. But when it grows back, it may be a different color or texture. In older adults who still had hair color before chemotherapy, the new growth may be completely gray.  Often, new hair is very fine and soft.  Infection Prevention Please wash your hands for at least 30 seconds using warm soapy water. Handwashing is the #1 way to prevent the spread of germs. Stay away from sick people or people who are getting over a cold. If you develop respiratory systems such as green/yellow mucus production or productive cough or persistent cough let us know and we will see if you need an antibiotic. It is a good idea to keep a pair of gloves on when going into grocery stores/[employer] to  decrease your risk of coming into contact with germs on the carts, etc. Carry alcohol hand gel with you at all times and use it frequently if out in public. If your temperature reaches 100.5 or higher please call the clinic and let us know.  If it is after hours or on the weekend please go to the ER if your temperature is over 100.5.  Please have your own personal thermometer at home to use.    Sex and bodily fluids If you are going to have sex, a condom must be used to protect the person that isn't taking chemotherapy. Chemo can decrease your libido (sex drive). For a few days after chemotherapy, chemotherapy can be excreted through your bodily fluids.  When using the toilet please close the lid and flush the toilet twice.  Do this for a few day after you have had chemotherapy.   Effects of chemotherapy on your sex life Some changes are simple and won't last long. They won't affect your sex life permanently.  Sometimes you may feel: too tired not strong enough to be very active sick or sore  not in the mood anxious or low Your anxiety might not seem related to sex. For example, you may be worried about the cancer and how your treatment is going. Or you may be worried about money, or about how you family are coping with your illness.  These things can cause stress, which can affect your interest in sex. It's important to talk to your partner  about how you feel.  Remember - the changes to your sex life don't usually last long. There's usually no medical reason to stop having sex during chemo. The drugs won't have any long term physical effects on your performance or enjoyment of sex. Cancer can't be passed on to your partner during sex  Contraception It's important to use reliable contraception during treatment. Avoid getting pregnant while you or your partner are having chemotherapy. This is because the drugs may harm the baby. Sometimes chemotherapy drugs can leave a man or woman infertile.  This  means you would not be able to have children in the future. You might want to talk to someone about permanent infertility. It can be very difficult to learn that you may no longer be able to have children. Some people find counselling helpful. There might be ways to preserve your fertility, although this is easier for men than for women. You may want to speak to a fertility expert. You can talk about sperm banking or harvesting your eggs. You can also ask about other fertility options, such as donor eggs. If you have or have had breast cancer, your doctor might advise you not to take the contraceptive pill. This is because the hormones in it might affect the cancer. It is not known for sure whether or not chemotherapy drugs can be passed on through semen or secretions from the vagina. Because of this some doctors advise people to use a barrier method if you have sex during treatment. This applies to vaginal, anal or oral sex. Generally, doctors advise a barrier method only for the time you are actually having the treatment and for about a week after your treatment. Advice like this can be worrying, but this does not mean that you have to avoid being intimate with your partner. You can still have close contact with your partner and continue to enjoy sex.  Animals If you have cats or birds we just ask that you not change the litter or change the cage.  Please have someone else do this for you while you are on chemotherapy.   Food Safety During and After Cancer Treatment Food safety is important for people both during and after cancer treatment. Cancer and cancer treatments, such as chemotherapy, radiation therapy, and stem cell/bone marrow transplantation, often weaken the immune system. This makes it harder for your body to protect itself from foodborne illness, also called food poisoning. Foodborne illness is caused by eating food that contains harmful bacteria, parasites, or viruses.  Foods to avoid Some  foods have a higher risk of becoming tainted with bacteria. These include: Unwashed fresh fruit and vegetables, especially leafy vegetables that can hide dirt and other contaminants Raw sprouts, such as alfalfa sprouts Raw or undercooked beef, especially ground beef, or other raw or undercooked meat and poultry Fatty, fried, or spicy foods immediately before or after treatment.  These can sit heavy on your stomach and make you feel nauseous. Raw or undercooked shellfish, such as oysters. Sushi and sashimi, which often contain raw fish.  Unpasteurized beverages, such as unpasteurized fruit juices, raw milk, raw yogurt, or cider Undercooked eggs, such as soft boiled, over easy, and poached; raw, unpasteurized eggs; or foods made with raw egg, such as homemade raw cookie dough and homemade mayonnaise  Simple steps for food safety  Shop smart. Do not buy food stored or displayed in an unclean area. Do not buy bruised or damaged fruits or vegetables. Do not buy cans that  have cracks, dents, or bulges. Pick up foods that can spoil at the end of your shopping trip and store them in a cooler on the way home.  Prepare and clean up foods carefully. Rinse all fresh fruits and vegetables under running water, and dry them with a clean towel or paper towel. Clean the top of cans before opening them. After preparing food, wash your hands for 20 seconds with hot water and soap. Pay special attention to areas between fingers and under nails. Clean your utensils and dishes with hot water and soap. Disinfect your kitchen and cutting boards using 1 teaspoon of liquid, unscented bleach mixed into 1 quart of water.    Dispose of old food. Eat canned and packaged food before its expiration date (the "use by" or "best before" date). Consume refrigerated leftovers within 3 to 4 days. After that time, throw out the food. Even if the food does not smell or look spoiled, it still may be unsafe. Some bacteria, such as  Listeria, can grow even on foods stored in the refrigerator if they are kept for too long.  Take precautions when eating out. At restaurants, avoid buffets and salad bars where food sits out for a long time and comes in contact with many people. Food can become contaminated when someone with a virus, often a norovirus, or another "bug" handles it. Put any leftover food in a "to-go" container yourself, rather than having the server do it. And, refrigerate leftovers as soon as you get home. Choose restaurants that are clean and that are willing to prepare your food as you order it cooked.   AT HOME MEDICATIONS:                                                                                                                                                                Compazine/Prochlorperazine '10mg'$  tablet. Take 1 tablet every 6 hours as needed for nausea/vomiting. (This can make you sleepy)   EMLA cream. Apply a quarter size amount to port site 1 hour prior to chemo. Do not rub in. Cover with plastic wrap.    Diarrhea Sheet   If you are having loose stools/diarrhea, please purchase Imodium and begin taking as outlined:  At the first sign of poorly formed or loose stools you should begin taking Imodium (loperamide) 2 mg capsules.  Take two tablets ('4mg'$ ) followed by one tablet ('2mg'$ ) every 2 hours - DO NOT EXCEED 8 tablets in 24 hours.  If it is bedtime and you are having loose stools, take 2 tablets at bedtime, then 2 tablets every 4 hours until morning.   Always call the [facility] if you are having loose stools/diarrhea that you can't get under control.  Loose stools/diarrhea leads to dehydration (loss of water)  in your body.  We have other options of trying to get the loose stools/diarrhea to stop but you must let us know!   Constipation Sheet  Colace - 100 mg capsules - take 2 capsules daily.  If this doesn't help then you can increase to 2 capsules twice daily.  Please call if the  above does not work for you. Do not go more than 2 days without a bowel movement.  It is very important that you do not become constipated.  It will make you feel sick to your stomach (nausea) and can cause abdominal pain and vomiting.  Nausea Sheet   Compazine/Prochlorperazine '10mg'$  tablet. Take 1 tablet every 6 hours as needed for nausea/vomiting (This can make you drowsy).  If you are having persistent nausea (nausea that does not stop) please call the [facility] and let us know the amount of nausea that you are experiencing.  If you begin to vomit, you need to call the [facility] and if it is the weekend and you have vomited more than one time and can't get it to stop-go to the Emergency Room.  Persistent nausea/vomiting can lead to dehydration (loss of fluid in your body) and will make you feel very weak and unwell. Ice chips, sips of clear liquids, foods that are at room temperature, crackers, and toast tend to be better tolerated.   SYMPTOMS TO REPORT AS SOON AS POSSIBLE AFTER TREATMENT:  FEVER GREATER THAN 100.5 F  CHILLS WITH OR WITHOUT FEVER  NAUSEA AND VOMITING THAT IS NOT CONTROLLED WITH YOUR NAUSEA MEDICATION  UNUSUAL SHORTNESS OF BREATH  UNUSUAL BRUISING OR BLEEDING  TENDERNESS IN MOUTH AND THROAT WITH OR WITHOUT   PRESENCE OF ULCERS  URINARY PROBLEMS  BOWEL PROBLEMS  UNUSUAL RASH      Wear comfortable clothing and clothing appropriate for easy access to any Portacath or PICC line. Let us know if there is anything that we can do to make your therapy better!    What to do if you need assistance after hours or on the weekends: CALL [phone].  HOLD on the line, do not hang up.  You will hear multiple messages but at the end you will be connected with a nurse triage line.  They will contact the doctor if necessary.  Most of the time they will be able to assist you.  Do not call the hospital operator.      I have been informed and understand all of the  instructions given to me and have received a copy. I have been instructed to call the clinic [phone] or my family physician as soon as possible for continued medical care, if indicated. I do not have any more questions at this time but understand that I may call the [facility] or the Patient Navigator at [phone] during office hours should I have questions or need assistance in obtaining follow-up care.

## 2021-08-11 ENCOUNTER — Other Ambulatory Visit: Payer: Self-pay | Admitting: Radiology

## 2021-08-12 NOTE — Progress Notes (Signed)
Pharmacist Chemotherapy Monitoring - Initial Assessment    Anticipated start date: 08/18/21   The following has been reviewed per standard work regarding the patient's treatment regimen: The patient's diagnosis, treatment plan and drug doses, and organ/hematologic function Lab orders and baseline tests specific to treatment regimen  The treatment plan start date, drug sequencing, and pre-medications Prior authorization status  Patient's documented medication list, including drug-drug interaction screen and prescriptions for anti-emetics and supportive care specific to the treatment regimen The drug concentrations, fluid compatibility, administration routes, and timing of the medications to be used The patient's access for treatment and lifetime cumulative dose history, if applicable  The patient's medication allergies and previous infusion related reactions, if applicable   Changes made to treatment plan:  treatment plan date  Follow up needed:  N/A   Wynona Neat, [facility], 08/12/2021  4:01 PM

## 2021-08-13 ENCOUNTER — Ambulatory Visit (HOSPITAL_COMMUNITY)
Admission: RE | Admit: 2021-08-13 | Discharge: 2021-08-13 | Disposition: A | Discharge status: Home / Self Care | Payer: 59 | Source: Ambulatory Visit | Attending: Hematology | Admitting: Hematology

## 2021-08-13 ENCOUNTER — Encounter (HOSPITAL_COMMUNITY): Payer: Self-pay

## 2021-08-13 ENCOUNTER — Other Ambulatory Visit: Payer: Self-pay

## 2021-08-13 ENCOUNTER — Other Ambulatory Visit (HOSPITAL_COMMUNITY): Payer: Self-pay | Admitting: Hematology

## 2021-08-13 DIAGNOSIS — R7303 Prediabetes: Secondary | ICD-10-CM | POA: Diagnosis not present

## 2021-08-13 DIAGNOSIS — C189 Malignant neoplasm of colon, unspecified: Secondary | ICD-10-CM | POA: Insufficient documentation

## 2021-08-13 DIAGNOSIS — C20 Malignant neoplasm of rectum: Secondary | ICD-10-CM | POA: Diagnosis not present

## 2021-08-13 DIAGNOSIS — Z87891 Personal history of nicotine dependence: Secondary | ICD-10-CM | POA: Diagnosis not present

## 2021-08-13 DIAGNOSIS — I10 Essential (primary) hypertension: Secondary | ICD-10-CM | POA: Insufficient documentation

## 2021-08-13 DIAGNOSIS — Z79899 Other long term (current) drug therapy: Secondary | ICD-10-CM | POA: Diagnosis not present

## 2021-08-13 DIAGNOSIS — K219 Gastro-esophageal reflux disease without esophagitis: Secondary | ICD-10-CM | POA: Diagnosis not present

## 2021-08-13 HISTORY — PX: IR IMAGING GUIDED PORT INSERTION: IMG5740

## 2021-08-13 HISTORY — PX: IR US GUIDANCE: IMG2393

## 2021-08-13 MED ORDER — LIDOCAINE-EPINEPHRINE 1 %-1:100000 IJ SOLN
INTRAMUSCULAR | Status: AC
  Filled 2021-08-13: qty 1

## 2021-08-13 MED ORDER — LIDOCAINE HCL (PF) 1 % IJ SOLN
INTRAMUSCULAR | Status: DC | PRN
  Administered 2021-08-13: 10 mL via SUBCUTANEOUS

## 2021-08-13 MED ORDER — FENTANYL CITRATE (PF) 100 MCG/2ML IJ SOLN
INTRAMUSCULAR | Status: AC
  Filled 2021-08-13: qty 2

## 2021-08-13 MED ORDER — MIDAZOLAM HCL 2 MG/2ML IJ SOLN
INTRAMUSCULAR | Status: DC | PRN
  Administered 2021-08-13: .5 mg via INTRAVENOUS
  Administered 2021-08-13: 1 mg via INTRAVENOUS
  Administered 2021-08-13: .5 mg via INTRAVENOUS

## 2021-08-13 MED ORDER — FENTANYL CITRATE (PF) 100 MCG/2ML IJ SOLN
INTRAMUSCULAR | Status: DC | PRN
  Administered 2021-08-13: 50 ug via INTRAVENOUS
  Administered 2021-08-13: 25 ug via INTRAVENOUS

## 2021-08-13 MED ORDER — SODIUM CHLORIDE 0.9 % IV SOLN: INTRAVENOUS | Status: DC

## 2021-08-13 MED ORDER — MIDAZOLAM HCL 2 MG/2ML IJ SOLN
INTRAMUSCULAR | Status: AC
  Filled 2021-08-13: qty 2

## 2021-08-13 MED ORDER — HEPARIN SOD (PORK) LOCK FLUSH 100 UNIT/ML IV SOLN
INTRAVENOUS | Status: AC
  Filled 2021-08-13: qty 5

## 2021-08-13 NOTE — H&P (Signed)
Chief Complaint: Patient was seen in consultation today for Port-A-Cath placement  at the request of [place]  Referring Physician(s): Katragadda,Sreedhar  Supervising Physician: Sandi Mariscal  Patient Status: [facility] - Out-pt  History of Present Illness: Alexandra Bright is a 55 y.o. female with PMH of colon cancer, constipation, GERD, and HTN, prediabetes.  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  She has been receiving chemotherapy and had colon resection approximately 4 weeks ago.  She has been referred here today by Dr. Delton Coombes for Port-A-Cath placement to continue chemotherapy treatment. 06/30/2021 CT chest abdomen pelvis: IMPRESSION: 1. Mild residual irregular wall thickening involving the rectosigmoid area without discrete mass. No mesorectal or sigmoid mesocolon adenopathy. 2. No findings for metastatic disease involving the chest, abdomen or pelvis. 3. Stable bilateral adrenal gland nodules, likely benign adenomas. 4. Stable emphysematous changes.     Past Medical History:  Diagnosis Date   Anxiety    Colon cancer ([place]) 01/2021   Constipation    GERD (gastroesophageal reflux disease)    Hypertension    Pre-diabetes     Past Surgical History:  Procedure Laterality Date   BIOPSY  02/18/2021   Procedure: BIOPSY;  Surgeon: Harvel Quale, MD;  Location: [facility];  Service: Gastroenterology;;   FACIAL FRACTURE SURGERY  1991   from [place]  02/18/2021   Procedure: FLEXIBLE SIGMOIDOSCOPY;  Surgeon: Harvel Quale, MD;  Location: [facility];  Service: Gastroenterology;;   POLYPECTOMY  02/18/2021   Procedure: POLYPECTOMY INTESTINAL;  Surgeon: Harvel Quale, MD;  Location: [facility];  Service: Gastroenterology;;   SUBMUCOSAL TATTOO INJECTION  02/18/2021   Procedure: SUBMUCOSAL TATTOO INJECTION;  Surgeon: Harvel Quale, MD;  Location: [facility];  Service:  Gastroenterology;;   XI ROBOTIC ASSISTED LOWER ANTERIOR RESECTION N/A 07/10/2021   Procedure: XI ROBOTIC ASSISTED LOWER ANTERIOR RESECTION;  Surgeon: Leighton Ruff, MD;  Location: [facility];  Service: General;  Laterality: N/A;    Allergies: Patient has no known allergies.  Medications: Prior to Admission medications   Medication Sig Start Date End Date Taking? Authorizing Provider  buPROPion (WELLBUTRIN SR) 200 MG 12 hr tablet Take 1 tablet (200 mg total) by mouth 2 (two) times daily. 11/27/20  Yes Lindell Spar, MD  dicyclomine (BENTYL) 10 MG capsule Take 10 mg by mouth 2 (two) times daily as needed for spasms.   Yes [provider]  diphenhydrAMINE (BENADRYL) 25 MG tablet Take 25 mg by mouth every 6 (six) hours as needed for allergies.   Yes [provider]  loperamide (IMODIUM) 1 MG/5ML solution Take 2 mg by mouth as needed for diarrhea or loose stools.   Yes [provider]  losartan (COZAAR) 50 MG tablet Take 1 tablet by mouth once daily Patient taking differently: Take 50 mg by mouth daily. 05/26/21  Yes Lindell Spar, MD  Multiple Vitamins-Minerals (MULTIVITAMIN WITH MINERALS) tablet Take 1 tablet by mouth in the morning. Women's Multivitamin   Yes [provider]  sennosides-docusate sodium (SENOKOT-S) 8.6-50 MG tablet Take 1 tablet by mouth daily as needed for constipation.   Yes [provider]  traMADol (ULTRAM) 50 MG tablet Take 1 tablet (50 mg total) by mouth 2 (two) times daily as needed. 08/06/21  Yes Derek Jack, MD  gabapentin (NEURONTIN) 100 MG capsule Take 1 capsule (100 mg total) by mouth 2 (two) times daily. Patient not taking: Reported on 08/11/2021 02/26/21   Lindell Spar, MD  hydrocortisone cream 0.5 % Apply 1 application topically 2 (two) times daily. Patient not taking: Reported on 08/11/2021 05/12/21   Orson Slick, MD  simethicone (MYLICON) 80 MG chewable tablet Chew 80 mg by mouth every 6 (six) hours as needed  for flatulence.    [provider]     Family History  Problem Relation Age of Onset   Diabetes Sister    Cancer Maternal Aunt    Ovarian cancer Cousin    Ovarian cancer Paternal Aunt    Prostate cancer Cousin     Social History   Socioeconomic History   Marital status: Married    Spouse name: Not on file   Number of children: Not on file   Years of education: Not on file   Highest education level: Not on file  Occupational History   Not on file  Tobacco Use   Smoking status: Former    Packs/day: 0.50    Years: 40.00    Pack years: 20.00    Types: Cigarettes    Quit date: 2020    Years since quitting: 2.7   Smokeless tobacco: Never  Vaping Use   Vaping Use: Never used  Substance and Sexual Activity   Alcohol use: Not Currently   Drug use: Never   Sexual activity: Not Currently  Other Topics Concern   Not on file  Social History Narrative   Not on file   Social Determinants of Health   Financial Resource Strain: Low Risk    Difficulty of Paying Living Expenses: Not hard at all  Food Insecurity: No Food Insecurity   Worried About Charity fundraiser in the Last Year: Never true   [place] in the Last Year: Never true  Transportation Needs: No Transportation Needs   Lack of Transportation (Medical): No   Lack of Transportation (Non-Medical): No  Physical Activity: Insufficiently Active   Days of Exercise per Week: 3 days   Minutes of Exercise per Session: 20 min  Stress: No Stress Concern Present   Feeling of Stress : Only a little  Social Connections: Engineer, building services of Communication with Friends and Family: More than three times a week   Frequency of Social Gatherings with Friends and Family: More than three times a week   Attends Religious Services: More than 4 times per year   Active Member of [employer] or Organizations: Yes   Attends Music therapist: More than 4 times per year   Marital Status: Married      Review of Systems: A 12 point ROS discussed and pertinent positives are indicated in the HPI above.  All other systems are negative.  Review of Systems  Constitutional:  Negative for chills and fever.  Respiratory:  Negative for cough and shortness of breath.   Cardiovascular:  Negative for chest pain and leg swelling.  Gastrointestinal:  Positive for abdominal pain. Negative for diarrhea, nausea and vomiting.       Soreness from colon resection approximately 4 weeks ago   Vital Signs: BP 112/90   Pulse 73   Temp 97.8 F (36.6 C) (Oral)   Resp 16   Ht 5\' 1"  (1.549 m)   Wt 126 lb (57.2 kg)   SpO2 100%   BMI 23.81 kg/m   Physical Exam Constitutional:      Appearance: Normal appearance.  HENT:     Head: Normocephalic and atraumatic.     Mouth/Throat:     Mouth:  Mucous membranes are moist.     Pharynx: Oropharynx is clear.  Cardiovascular:     Rate and Rhythm: Normal rate and regular rhythm.     Pulses: Normal pulses.     Heart sounds: No murmur heard.   No gallop.  Pulmonary:     Effort: Pulmonary effort is normal. No respiratory distress.     Breath sounds: Normal breath sounds. No stridor. No wheezing, rhonchi or rales.  Abdominal:     General: Bowel sounds are normal. There is no distension.     Palpations: Abdomen is soft.     Tenderness: There is abdominal tenderness. There is no guarding.  Musculoskeletal:     Right lower leg: No edema.     Left lower leg: No edema.  Skin:    General: Skin is warm and dry.  Neurological:     Mental Status: She is alert and oriented to person, place, and time. Mental status is at baseline.  Psychiatric:        Mood and Affect: Mood normal.        Behavior: Behavior normal.        Thought Content: Thought content normal.        Judgment: Judgment normal.    Imaging: No results found.  Labs:  CBC: Recent Labs    06/17/21 1121 06/29/21 1057 07/11/21 0440 07/12/21 0345  WBC 8.3 10.2 13.5* 9.2  HGB 10.5* 12.3  9.9* 9.6*  HCT 31.2* 37.8 31.0* 30.1*  PLT 281 344 228 209    COAGS: No results for input(s): INR, APTT in the last 8760 hours.  BMP: Recent Labs    11/27/20 1138 03/06/21 0814 06/17/21 1121 06/29/21 1057 07/11/21 0440 07/12/21 0345  NA 140   < > 136 137 139 139  K 4.6   < > 3.5 4.1 4.3 3.6  CL 102   < > 106 105 106 106  CO2 19*   < > 22 24 23 26   GLUCOSE 99   < > 118* 116* 180* 72  BUN 14   < > 8 11 <5* <5*  CALCIUM 9.9   < > 8.5* 9.4 9.1 8.8*  CREATININE 0.89   < > 0.80 0.94 0.85 0.75  GFRNONAA 74   < > >60 >60 >60 >60  GFRAA 85  --   --   --   --   --    < > = values in this interval not displayed.    LIVER FUNCTION TESTS: Recent Labs    11/27/20 1138 05/26/21 1421 06/17/21 1121 06/29/21 1057  BILITOT <0.2 1.0 0.6 0.5  AST 20 117* 47* 19  ALT 25 226* 80* 23  ALKPHOS 102 81 102 92  PROT 7.5 6.5 6.2* 7.5  ALBUMIN 4.5 3.7 3.0* 3.9    TUMOR MARKERS: Recent Labs    02/19/21 1133  CEA 1.8    Assessment and Plan:  History of colon cancer, constipation, GERD, and HTN, prediabetes.  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  She has been receiving chemotherapy and had colon resection approximately 4 weeks ago.  She has been referred here today by Dr. Delton Coombes for Port-A-Cath placement to continue chemotherapy treatment.  Patient lying in bed.  She is alert and oriented, calm and pleasant.  She is NPO per order. She is in no apparent distress.   Risks and benefits of image guided port-a-catheter placement was discussed with the patient including, but not limited to bleeding, infection, pneumothorax, or  fibrin sheath development and need for additional procedures.  All of the patient's questions were answered, patient is agreeable to proceed. Consent signed and in chart.   Thank you for this interesting consult.  I greatly enjoyed meeting Alexandra Bright and look forward to participating in their care.  A copy of this report was sent to the  requesting provider on this date.  Electronically Signed: Tyson Alias, NP 08/13/2021, 8:24 AM   I spent a total of 30 minutes in face to face in clinical consultation, greater than 50% of which was counseling/coordinating care for Port-A-Cath placement.

## 2021-08-13 NOTE — Procedures (Signed)
Pre Procedure Dx: Poor venous access Post Procedural Dx: Same  Successful placement of right IJ approach port-a-cath with tip at the superior caval atrial junction. The catheter is ready for immediate use.  Estimated Blood Loss: Minimal  Complications: None immediate.  Jay Romey Mathieson, MD Pager #: [phone]   

## 2021-08-14 ENCOUNTER — Encounter (HOSPITAL_COMMUNITY): Payer: Self-pay | Admitting: Radiology

## 2021-08-17 ENCOUNTER — Ambulatory Visit (INDEPENDENT_AMBULATORY_CARE_PROVIDER_SITE_OTHER): Payer: 59 | Admitting: Internal Medicine

## 2021-08-17 ENCOUNTER — Encounter: Payer: Self-pay | Admitting: Internal Medicine

## 2021-08-17 ENCOUNTER — Other Ambulatory Visit: Payer: Self-pay

## 2021-08-17 ENCOUNTER — Encounter (HOSPITAL_COMMUNITY): Payer: Self-pay

## 2021-08-17 ENCOUNTER — Inpatient Hospital Stay (HOSPITAL_COMMUNITY): Payer: 59

## 2021-08-17 ENCOUNTER — Encounter (HOSPITAL_COMMUNITY): Payer: Self-pay | Admitting: Hematology

## 2021-08-17 VITALS — BP 131/84 | HR 87 | Resp 18 | Ht 61.0 in | Wt 129.0 lb

## 2021-08-17 DIAGNOSIS — C189 Malignant neoplasm of colon, unspecified: Secondary | ICD-10-CM | POA: Diagnosis not present

## 2021-08-17 DIAGNOSIS — D539 Nutritional anemia, unspecified: Secondary | ICD-10-CM | POA: Diagnosis not present

## 2021-08-17 DIAGNOSIS — F419 Anxiety disorder, unspecified: Secondary | ICD-10-CM

## 2021-08-17 DIAGNOSIS — I10 Essential (primary) hypertension: Secondary | ICD-10-CM | POA: Diagnosis not present

## 2021-08-17 DIAGNOSIS — Z95828 Presence of other vascular implants and grafts: Secondary | ICD-10-CM

## 2021-08-17 DIAGNOSIS — C20 Malignant neoplasm of rectum: Secondary | ICD-10-CM

## 2021-08-17 HISTORY — DX: Presence of other vascular implants and grafts: Z95.828

## 2021-08-17 MED ORDER — PROCHLORPERAZINE MALEATE 10 MG PO TABS: 10.0000 mg | ORAL_TABLET | Freq: Four times a day (QID) | ORAL | 1 refills | Status: DC | PRN

## 2021-08-17 MED ORDER — LIDOCAINE-PRILOCAINE 2.5-2.5 % EX CREA: TOPICAL_CREAM | CUTANEOUS | 3 refills | Status: DC

## 2021-08-17 NOTE — Assessment & Plan Note (Signed)
S/p radiation and LAR surgery Followed by oncology- plan to start chemotherapy soon, has Port-A-Cath in place

## 2021-08-17 NOTE — Progress Notes (Signed)
Established Patient Office Visit  Subjective:  Patient ID: Alexandra Bright, female    DOB: [birth year]  Age: 55 y.o. MRN: [id number]  CC:  Chief Complaint  Patient presents with   Follow-up    4 month follow up fixing to start chemo but she is doing ok     HPI Alexandra Bright is a 55 year old female with PMH of anxiety, HTN and colon adenocarcinoma who presents for follow up of her chronic medical conditions.  Her BP is well-controlled now with Losartan. She denies any headache, dizziness, chest pain, dyspnea or palpitations.  She has intermittent rectal pain, which is improving.  She has alternating constipation with diarrhea.  She takes stool softener - senna for constipation.  Her stool caliber has improved now.  She denies any melena or hematochezia currently.  She is going to start chemotherapy soon for adenocarcinoma of colon.  Her anxiety is well-controlled with Wellbutrin.  She denies any anhedonia, SI or HI.  Past Medical History:  Diagnosis Date   Anxiety    Colon cancer ([place]) 01/2021   Constipation    GERD (gastroesophageal reflux disease)    Hypertension    Port-A-Cath in place 08/17/2021   Pre-diabetes     Past Surgical History:  Procedure Laterality Date   BIOPSY  02/18/2021   Procedure: BIOPSY;  Surgeon: Harvel Quale, MD;  Location: [facility];  Service: Gastroenterology;;   FACIAL FRACTURE SURGERY  1991   from [place]  02/18/2021   Procedure: FLEXIBLE SIGMOIDOSCOPY;  Surgeon: Harvel Quale, MD;  Location: [facility];  Service: Gastroenterology;;   IR IMAGING GUIDED PORT INSERTION  08/13/2021   POLYPECTOMY  02/18/2021   Procedure: POLYPECTOMY INTESTINAL;  Surgeon: Harvel Quale, MD;  Location: [facility];  Service: Gastroenterology;;   SUBMUCOSAL TATTOO INJECTION  02/18/2021   Procedure: SUBMUCOSAL TATTOO INJECTION;  Surgeon: Montez Morita, Quillian Quince, MD;  Location: [facility];  Service:  Gastroenterology;;   XI ROBOTIC ASSISTED LOWER ANTERIOR RESECTION N/A 07/10/2021   Procedure: XI ROBOTIC ASSISTED LOWER ANTERIOR RESECTION;  Surgeon: Leighton Ruff, MD;  Location: [facility];  Service: General;  Laterality: N/A;    Family History  Problem Relation Age of Onset   Diabetes Sister    Cancer Maternal Aunt    Ovarian cancer Cousin    Ovarian cancer Paternal Aunt    Prostate cancer Cousin     Social History   Socioeconomic History   Marital status: Married    Spouse name: Not on file   Number of children: Not on file   Years of education: Not on file   Highest education level: Not on file  Occupational History   Not on file  Tobacco Use   Smoking status: Former    Packs/day: 0.50    Years: 40.00    Pack years: 20.00    Types: Cigarettes    Quit date: 2020    Years since quitting: 2.7   Smokeless tobacco: Never  Vaping Use   Vaping Use: Never used  Substance and Sexual Activity   Alcohol use: Not Currently   Drug use: Never   Sexual activity: Not Currently  Other Topics Concern   Not on file  Social History Narrative   Not on file   Social Determinants of Health   Financial Resource Strain: Low Risk    Difficulty of Paying Living Expenses: Not hard at all  Food Insecurity: No Food Insecurity   Worried About Running Out  of Food in the Last Year: Never true   [place] in the Last Year: Never true  Transportation Needs: No Transportation Needs   Lack of Transportation (Medical): No   Lack of Transportation (Non-Medical): No  Physical Activity: Insufficiently Active   Days of Exercise per Week: 3 days   Minutes of Exercise per Session: 20 min  Stress: No Stress Concern Present   Feeling of Stress : Only a little  Social Connections: Engineer, building services of Communication with Friends and Family: More than three times a week   Frequency of Social Gatherings with Friends and Family: More than three times a week   Attends Religious  Services: More than 4 times per year   Active Member of [employer] or Organizations: Yes   Attends Music therapist: More than 4 times per year   Marital Status: Married  Human resources officer Violence: Not At Risk   Fear of Current or Ex-Partner: No   Emotionally Abused: No   Physically Abused: No   Sexually Abused: No    Outpatient Medications Prior to Visit  Medication Sig Dispense Refill   buPROPion (WELLBUTRIN SR) 200 MG 12 hr tablet Take 1 tablet (200 mg total) by mouth 2 (two) times daily. 60 tablet 5   diphenhydrAMINE (BENADRYL) 25 MG tablet Take 25 mg by mouth every 6 (six) hours as needed for allergies.     [START ON 08/18/2021] fluorouracil CALGB 44818 2,400 mg/m2 in sodium chloride 0.9 % 150 mL Inject 2,400 mg/m2 into the vein over 48 hr.     FLUOROURACIL IV Inject into the vein every 14 (fourteen) days.     [START ON 08/18/2021] LEUCOVORIN CALCIUM IV Inject into the vein every 14 (fourteen) days.     lidocaine-prilocaine (EMLA) cream Apply a small amount to port a cath site and cover with plastic wrap 1 hour prior to chemotherapy appointments 30 g 3   loperamide (IMODIUM) 1 MG/5ML solution Take 2 mg by mouth as needed for diarrhea or loose stools.     losartan (COZAAR) 50 MG tablet Take 1 tablet by mouth once daily (Patient taking differently: Take 50 mg by mouth daily.) 90 tablet 0   Multiple Vitamins-Minerals (MULTIVITAMIN WITH MINERALS) tablet Take 1 tablet by mouth in the morning. Women's Multivitamin     [START ON 08/18/2021] OXALIPLATIN IV Inject into the vein every 14 (fourteen) days.     prochlorperazine (COMPAZINE) 10 MG tablet Take 1 tablet (10 mg total) by mouth every 6 (six) hours as needed (Nausea or vomiting). 30 tablet 1   sennosides-docusate sodium (SENOKOT-S) 8.6-50 MG tablet Take 1 tablet by mouth daily as needed for constipation.     simethicone (MYLICON) 80 MG chewable tablet Chew 80 mg by mouth every 6 (six) hours as needed for flatulence.     traMADol  (ULTRAM) 50 MG tablet Take 1 tablet (50 mg total) by mouth 2 (two) times daily as needed. 60 tablet 0   dicyclomine (BENTYL) 10 MG capsule Take 10 mg by mouth 2 (two) times daily as needed for spasms.     gabapentin (NEURONTIN) 100 MG capsule Take 1 capsule (100 mg total) by mouth 2 (two) times daily. (Patient not taking: No sig reported) 60 capsule 5   hydrocortisone cream 0.5 % Apply 1 application topically 2 (two) times daily. (Patient not taking: No sig reported) 30 g 0   No facility-administered medications prior to visit.    No Known Allergies  ROS Review  of Systems  Constitutional:  Negative for chills and fever.  HENT:  Negative for congestion, sinus pressure, sinus pain and sore throat.   Eyes:  Negative for pain and discharge.  Respiratory:  Negative for cough and shortness of breath.   Cardiovascular:  Negative for chest pain and palpitations.  Gastrointestinal:  Positive for constipation and diarrhea. Negative for abdominal pain, nausea and vomiting.  Endocrine: Negative for polydipsia and polyuria.  Genitourinary:  Negative for dysuria and hematuria.  Musculoskeletal:  Positive for arthralgias and back pain. Negative for neck pain and neck stiffness.  Skin:  Negative for rash.  Neurological:  Positive for numbness. Negative for dizziness, seizures, syncope and weakness.  Psychiatric/Behavioral:  Negative for agitation and behavioral problems.      Objective:    Physical Exam Vitals reviewed.  Constitutional:      General: She is not in acute distress.    Appearance: She is not diaphoretic.  HENT:     Head: Normocephalic and atraumatic.     Nose: Nose normal.     Mouth/Throat:     Mouth: Mucous membranes are moist.  Eyes:     General: No scleral icterus.    Extraocular Movements: Extraocular movements intact.     Pupils: Pupils are equal, round, and reactive to light.  Cardiovascular:     Rate and Rhythm: Normal rate and regular rhythm.     Pulses: Normal  pulses.     Heart sounds: Normal heart sounds. No murmur heard. Pulmonary:     Breath sounds: Normal breath sounds. No wheezing or rales.  Abdominal:     Palpations: Abdomen is soft.     Tenderness: There is no abdominal tenderness. There is no guarding or rebound.  Musculoskeletal:     Cervical back: Neck supple. No tenderness.     Right lower leg: No edema.     Left lower leg: No edema.  Skin:    General: Skin is warm.     Findings: No rash.  Neurological:     General: No focal deficit present.     Mental Status: She is alert and oriented to person, place, and time.     Sensory: No sensory deficit.     Motor: No weakness.  Psychiatric:        Mood and Affect: Mood normal.        Behavior: Behavior normal.    BP 131/84 (BP Location: Left Arm, Patient Position: Sitting, Cuff Size: Normal)   Pulse 87   Resp 18   Ht 5\' 1"  (1.549 m)   Wt 129 lb 0.6 oz (58.5 kg)   SpO2 98%   BMI 24.38 kg/m  Wt Readings from Last 3 Encounters:  08/17/21 129 lb 0.6 oz (58.5 kg)  08/13/21 126 lb (57.2 kg)  08/06/21 129 lb 3 oz (58.6 kg)     Health Maintenance Due  Topic Date Due   Hepatitis C Screening  Never done    There are no preventive care reminders to display for this patient.  Lab Results  Component Value Date   TSH 1.480 11/27/2020   Lab Results  Component Value Date   WBC 9.2 07/12/2021   HGB 9.6 (L) 07/12/2021   HCT 30.1 (L) 07/12/2021   MCV 106.0 (H) 07/12/2021   PLT 209 07/12/2021   Lab Results  Component Value Date   NA 139 07/12/2021   K 3.6 07/12/2021   CO2 26 07/12/2021   GLUCOSE 72 07/12/2021   BUN <5 (L)  07/12/2021   CREATININE 0.75 07/12/2021   BILITOT 0.5 06/29/2021   ALKPHOS 92 06/29/2021   AST 19 06/29/2021   ALT 23 06/29/2021   PROT 7.5 06/29/2021   ALBUMIN 3.9 06/29/2021   CALCIUM 8.8 (L) 07/12/2021   ANIONGAP 7 07/12/2021   Lab Results  Component Value Date   CHOL 198 11/27/2020   Lab Results  Component Value Date   HDL 59 11/27/2020    Lab Results  Component Value Date   LDLCALC 112 (H) 11/27/2020   Lab Results  Component Value Date   TRIG 154 (H) 11/27/2020   Lab Results  Component Value Date   CHOLHDL 3.4 11/27/2020   Lab Results  Component Value Date   HGBA1C 5.2 06/30/2021      Assessment & Plan:   Problem List Items Addressed This Visit       Cardiovascular and Mediastinum   Hypertension - Primary    BP Readings from Last 1 Encounters:  08/17/21 131/84  Well-controlled with Losartan Counseled for compliance with the medications Advised DASH diet and moderate exercise/walking, at least 150 mins/week         Digestive   Adenocarcinoma of colon ([place])    S/p radiation and LAR surgery Followed by oncology- plan to start chemotherapy soon, has Port-A-Cath in place        Other   Anxiety    Well-controlled with Wellbutrin 200 mg twice daily      Macrocytic anemia    Advised to take Vitamin B12 supplements for now       No orders of the defined types were placed in this encounter.   Follow-up: Return in about 6 months (around 02/14/2022) for Annual physical.    Lindell Spar, MD

## 2021-08-17 NOTE — Assessment & Plan Note (Signed)
BP Readings from Last 1 Encounters:  08/17/21 131/84   Well-controlled with Losartan Counseled for compliance with the medications Advised DASH diet and moderate exercise/walking, at least 150 mins/week

## 2021-08-17 NOTE — Assessment & Plan Note (Signed)
Well-controlled with Wellbutrin 200 mg twice daily

## 2021-08-17 NOTE — Progress Notes (Signed)

## 2021-08-17 NOTE — Patient Instructions (Signed)
Please start taking Vitamin B12 1000 mcg once daily.  Continue to take other medications as prescribed.  Continue to stay well hydrated by taking at least 64 ounces of fluid in a day.

## 2021-08-17 NOTE — Progress Notes (Signed)
Alexandra Bright, Alexandra Bright   CLINIC:  Medical Oncology/Hematology  PCP:  Lindell Spar, MD [address] / [place] [address] [phone]   REASON FOR VISIT:  Follow-up for sigmoid adenocarcinoma  PRIOR THERAPY: none  NGS Results: not done  CURRENT THERAPY: FOLFOX every 2 weeks x 4 months  BRIEF ONCOLOGIC HISTORY:  Oncology History  Rectal cancer (Alexandra Bright)  04/08/2021 Initial Diagnosis   Rectal cancer (Alexandra Bright)   04/20/2021 - 04/20/2021 Chemotherapy          08/18/2021 -  Chemotherapy    Patient is on Treatment Plan: COLORECTAL FOLFOX Q14D X 4 MONTHS         CANCER STAGING: Cancer Staging Rectal cancer (Alexandra Bright Alexandra) Staging form: Colon and Rectum, AJCC 8th Edition - Clinical stage from 04/08/2021: Stage IIIB (cT3, cN1b, cM0) - Unsigned   INTERVAL HISTORY:  [name], a 55 y.o. female, returns for routine follow-up and consideration for next cycle of chemotherapy. Alexandra Bright was last seen on 08/06/2021.  Due for cycle #1 of FOLFOX today.   Overall, she tells me she has been feeling pretty well. She reports tingling in her legs while laying down at night which has been present since her first Covid vaccination. She denies rectal pain, and reports burning abdominal pain at her incision site. Her appetite is good.   Overall, she feels ready for next cycle of chemo today.   REVIEW OF SYSTEMS:  Review of Systems  Constitutional:  Negative for appetite change and fatigue.  Gastrointestinal:  Positive for abdominal pain (burning). Negative for rectal pain.  Neurological:  Negative for numbness (legs).  All other systems reviewed and are negative.  PAST MEDICAL/SURGICAL HISTORY:  Past Medical History:  Diagnosis Date   Anxiety    Colon cancer ([place]) 01/2021   Constipation    GERD (gastroesophageal reflux disease)    Hypertension    Port-A-Cath in place 08/17/2021   Pre-diabetes    Past Surgical History:  Procedure Laterality  Date   BIOPSY  02/18/2021   Procedure: BIOPSY;  Surgeon: Harvel Quale, MD;  [facility];  Service: Gastroenterology;;   FACIAL FRACTURE SURGERY  1991   from [place]  02/18/2021   Procedure: FLEXIBLE SIGMOIDOSCOPY;  Surgeon: Harvel Quale, MD;  Location: [facility];  Service: Gastroenterology;;   IR IMAGING GUIDED PORT INSERTION  08/13/2021   POLYPECTOMY  02/18/2021   Procedure: POLYPECTOMY INTESTINAL;  Surgeon: Harvel Quale, MD;  Location: [facility];  Service: Gastroenterology;;   SUBMUCOSAL TATTOO INJECTION  02/18/2021   Procedure: SUBMUCOSAL TATTOO INJECTION;  Surgeon: Harvel Quale, MD;  Location: [facility];  Service: Gastroenterology;;   XI ROBOTIC ASSISTED LOWER ANTERIOR RESECTION N/A 07/10/2021   Procedure: XI ROBOTIC ASSISTED LOWER ANTERIOR RESECTION;  Surgeon: Leighton Ruff, MD;  Location: [facility];  Service: General;  Laterality: N/A;    SOCIAL HISTORY:  Social History   Socioeconomic History   Marital status: Married    Spouse name: Not on file   Number of children: Not on file   Years of education: Not on file   Highest education level: Not on file  Occupational History   Not on file  Tobacco Use   Smoking status: Former    Packs/day: 0.50    Years: 40.00    Pack years: 20.00    Types: Cigarettes    Quit date: 2020    Years since  quitting: 2.7   Smokeless tobacco: Never  Vaping Use   Vaping Use: Never used  Substance and Sexual Activity   Alcohol use: Not Currently   Drug use: Never   Sexual activity: Not Currently  Other Topics Concern   Not on file  Social History Narrative   Not on file   Social Determinants of Health   Financial Resource Strain: Low Risk    Difficulty of Paying Living Expenses: Not hard at all  Food Insecurity: No Food Insecurity   Worried About Charity fundraiser in the Last Year: Never true   [place] in the Last Year: Never true   Transportation Needs: No Transportation Needs   Lack of Transportation (Medical): No   Lack of Transportation (Non-Medical): No  Physical Activity: Insufficiently Active   Days of Exercise per Week: 3 days   Minutes of Exercise per Session: 20 min  Stress: No Stress Concern Present   Feeling of Stress : Only a little  Social Connections: Engineer, building services of Communication with Friends and Family: More than three times a week   Frequency of Social Gatherings with Friends and Family: More than three times a week   Attends Religious Services: More than 4 times per year   Active Member of [employer] or Organizations: Yes   Attends Music therapist: More than 4 times per year   Marital Status: Married  Human resources officer Violence: Not At Risk   Fear of Current or Ex-Partner: No   Emotionally Abused: No   Physically Abused: No   Sexually Abused: No    FAMILY HISTORY:  Family History  Problem Relation Age of Onset   Diabetes Sister    Cancer Maternal Aunt    Ovarian cancer Cousin    Ovarian cancer Paternal Aunt    Prostate cancer Cousin     CURRENT MEDICATIONS:  Current Outpatient Medications  Medication Sig Dispense Refill   buPROPion (WELLBUTRIN SR) 200 MG 12 hr tablet Take 1 tablet (200 mg total) by mouth 2 (two) times daily. 60 tablet 5   dicyclomine (BENTYL) 10 MG capsule Take 10 mg by mouth 2 (two) times daily as needed for spasms.     diphenhydrAMINE (BENADRYL) 25 MG tablet Take 25 mg by mouth every 6 (six) hours as needed for allergies.     [START ON 08/18/2021] fluorouracil CALGB 32440 2,400 mg/m2 in sodium chloride 0.9 % 150 mL Inject 2,400 mg/m2 into the vein over 48 hr.     FLUOROURACIL IV Inject into the vein every 14 (fourteen) days.     [START ON 08/18/2021] LEUCOVORIN CALCIUM IV Inject into the vein every 14 (fourteen) days.     lidocaine-prilocaine (EMLA) cream Apply a small amount to port a cath site and cover with plastic wrap 1 hour prior to  chemotherapy appointments 30 g 3   loperamide (IMODIUM) 1 MG/5ML solution Take 2 mg by mouth as needed for diarrhea or loose stools.     losartan (COZAAR) 50 MG tablet Take 1 tablet by mouth once daily (Patient taking differently: Take 50 mg by mouth daily.) 90 tablet 0   Multiple Vitamins-Minerals (MULTIVITAMIN WITH MINERALS) tablet Take 1 tablet by mouth in the morning. Women's Multivitamin     [START ON 08/18/2021] OXALIPLATIN IV Inject into the vein every 14 (fourteen) days.     prochlorperazine (COMPAZINE) 10 MG tablet Take 1 tablet (10 mg total) by mouth every 6 (six) hours as needed (Nausea or vomiting).  30 tablet 1   sennosides-docusate sodium (SENOKOT-S) 8.6-50 MG tablet Take 1 tablet by mouth daily as needed for constipation.     simethicone (MYLICON) 80 MG chewable tablet Chew 80 mg by mouth every 6 (six) hours as needed for flatulence.     traMADol (ULTRAM) 50 MG tablet Take 1 tablet (50 mg total) by mouth 2 (two) times daily as needed. 60 tablet 0   No current facility-administered medications for this visit.    ALLERGIES:  No Known Allergies  PHYSICAL EXAM:  Performance status (ECOG): 1 - Symptomatic but completely ambulatory  There were no vitals filed for this visit. Wt Readings from Last 3 Encounters:  08/17/21 129 lb 0.6 oz (58.5 kg)  08/13/21 126 lb (57.2 kg)  08/06/21 129 lb 3 oz (58.6 kg)   Physical Exam Vitals reviewed.  Constitutional:      Appearance: Normal appearance.  Cardiovascular:     Rate and Rhythm: Normal rate and regular rhythm.     Pulses: Normal pulses.     Heart sounds: Normal heart sounds.  Pulmonary:     Effort: Pulmonary effort is normal.     Breath sounds: Normal breath sounds.  Neurological:     General: No focal deficit present.     Mental Status: She is alert and oriented to person, place, and time.  Psychiatric:        Mood and Affect: Mood normal.        Behavior: Behavior normal.    LABORATORY DATA:  I have reviewed the labs  as listed.  CBC Latest Ref Rng & Units 07/12/2021 07/11/2021 06/29/2021  WBC 4.0 - 10.5 K/uL 9.2 13.5(H) 10.2  Hemoglobin 12.0 - 15.0 g/dL 9.6(L) 9.9(L) 12.3  Hematocrit 36.0 - 46.0 % 30.1(L) 31.0(L) 37.8  Platelets 150 - 400 K/uL 209 228 344   CMP Latest Ref Rng & Units 07/12/2021 07/11/2021 06/29/2021  Glucose 70 - 99 mg/dL 72 180(H) 116(H)  BUN 6 - 20 mg/dL <5(L) <5(L) 11  Creatinine 0.44 - 1.00 mg/dL 0.75 0.85 0.94  Sodium 135 - 145 mmol/L 139 139 137  Potassium 3.5 - 5.1 mmol/L 3.6 4.3 4.1  Chloride 98 - 111 mmol/L 106 106 105  CO2 22 - 32 mmol/L 26 23 24   Calcium 8.9 - 10.3 mg/dL 8.8(L) 9.1 9.4  Total Protein 6.5 - 8.1 g/dL - - 7.5  Total Bilirubin 0.3 - 1.2 mg/dL - - 0.5  Alkaline Phos 38 - 126 U/L - - 92  AST 15 - 41 U/L - - 19  ALT 0 - 44 U/L - - 23    DIAGNOSTIC IMAGING:  I have independently reviewed the scans and discussed with the patient. IR IMAGING GUIDED PORT INSERTION  Result Date: 08/13/2021 INDICATION: History of rectal cancer. Poor venous access. In need of durable intravenous access for chemotherapy administration. EXAM: IMPLANTED PORT A CATH PLACEMENT WITH ULTRASOUND AND FLUOROSCOPIC GUIDANCE COMPARISON:  CT the chest, abdomen and pelvis-06/29/2021 MEDICATIONS: None ANESTHESIA/SEDATION: Moderate (conscious) sedation was employed during this procedure. A total of Versed 2 mg and Fentanyl 75 mcg was administered intravenously. Moderate Sedation Time: 27 minutes. The patient's level of consciousness and vital signs were monitored continuously by radiology nursing throughout the procedure under my direct supervision. CONTRAST:  None FLUOROSCOPY TIME:  30 seconds (4 mGy) COMPLICATIONS: None immediate. PROCEDURE: The procedure, risks, benefits, and alternatives were explained to the patient. Questions regarding the procedure were encouraged and answered. The patient understands and consents to the procedure. The right  neck and chest were prepped with chlorhexidine in a sterile  fashion, and a sterile drape was applied covering the operative field. Maximum barrier sterile technique with sterile gowns and gloves were used for the procedure. A timeout was performed prior to the initiation of the procedure. Local anesthesia was provided with 1% lidocaine with epinephrine. After creating a small venotomy incision, a micropuncture kit was utilized to access the internal jugular vein. Real-time ultrasound guidance was utilized for vascular access including the acquisition of a permanent ultrasound image documenting patency of the accessed vessel. The microwire was utilized to measure appropriate catheter length. A subcutaneous port pocket was then created along the upper chest wall utilizing a combination of sharp and blunt dissection. The pocket was irrigated with sterile saline. A single lumen slim sized power injectable port was chosen for placement. The 8 Fr catheter was tunneled from the port pocket site to the venotomy incision. The port was placed in the pocket. The external catheter was trimmed to appropriate length. At the venotomy, an 8 Fr peel-away sheath was placed over a guidewire under fluoroscopic guidance. The catheter was then placed through the sheath and the sheath was removed. Final catheter positioning was confirmed and documented with a fluoroscopic spot radiograph. The port was accessed with a Huber needle, aspirated and flushed with heparinized saline. The venotomy site was closed with an interrupted 4-0 Vicryl suture. The port pocket incision was closed with interrupted 2-0 Vicryl suture. Dermabond and Steri-strips were applied to both incisions. Dressings were applied. The patient tolerated the procedure well without immediate post procedural complication. FINDINGS: After catheter placement, the tip lies within the superior cavoatrial junction. The catheter aspirates and flushes normally and is ready for immediate use. IMPRESSION: Successful placement of a right internal  jugular approach power injectable Port-A-Cath. The catheter is ready for immediate use. Electronically Signed   By: Sandi Mariscal M.D.   On: 08/13/2021 11:06     ASSESSMENT:  1.  Rectosigmoid sigmoid adenocarcinoma: - Presentation with the bleeding per rectum for the last 5 to 6 months and constipation. - Colonoscopy on 02/18/2021 showed ulcerated partially obstructing mass found from 12-17 cm from the anal verge, circumferential measuring 5 cm in length. - Biopsy consistent with adenocarcinoma. - CT CAP on 03/06/2021 with circumferential wall thickening, hyperenhancement and fat stranding about the rectosigmoid junction, mass measuring approximately 6 cm in length.  No evidence of lymphadenopathy or metastatic disease. - CEA on 02/19/2021 was 1.8. - MRI of the pelvis on 04/06/2021 shows high rectal T3c/T4AN1 malignancy. - Chemoradiation therapy with Xeloda from 04/22/2021 through 06/01/2021. - CT CAP on 06/29/2021 showed improvement in the rectosigmoid thickening at the site of malignancy.  No clear adenopathy.  No evidence of metastatic disease. - Robotic assisted LAR on 07/10/2021 with pathology showing 2 cm grade 2, margins negative, 1/18 lymph nodes positive, no tumor deposits, positive treatment effect, no perineural invasion, no lymphovascular invasion, [id number], MMR preserved - She will need completion colonoscopy after adjuvant therapy.   2.  Social/family history: - She worked as a Secretary/administrator and recently stopped working.  She quit smoking in October 2021, smoked 1 pack/day for 40 years. - Maternal aunt had lung cancer and was a non-smoker.  Paternal aunt had cancer, type not known to the patient.   PLAN:  1.  T3c/T4N1 rectosigmoid  adenocarcinoma: - She is recovering well from surgery. - We have discussed the adjuvant chemotherapy with 4 months of FOLFOX to complete treatment plan. - We discussed  the side effects of this regimen in detail. - She is currently on stool softener.  She will stop  it if the stool gets softer. - Reviewed labs from today which showed normal LFTs.  CBC showed improvement in hemoglobin.  White count and platelets are normal. - Port site looks good and well-healed.  She will proceed with her first treatment today. - RTC 2 weeks for follow-up and next treatment.     2.  Rectal pain: - She reported improvement in rectal pain and is no longer requiring tramadol.   3.  Peripheral neuropathy: - She developed some neuropathy with numbness in the legs after COVID-vaccine in December 2021. - She stopped taking gabapentin at last visit with no worsening of symptoms.   Orders placed this encounter:  No orders of the defined types were placed in this encounter.    Derek Jack, MD [facility] [phone]   I, Thana Ates, am acting as a scribe for Dr. Derek Jack.  I, Derek Jack MD, have reviewed the above documentation for accuracy and completeness, and I agree with the above.

## 2021-08-17 NOTE — Assessment & Plan Note (Signed)
Advised to take Vitamin B12 supplements for now

## 2021-08-18 ENCOUNTER — Inpatient Hospital Stay (HOSPITAL_COMMUNITY): Payer: 59

## 2021-08-18 ENCOUNTER — Inpatient Hospital Stay (HOSPITAL_BASED_OUTPATIENT_CLINIC_OR_DEPARTMENT_OTHER): Payer: 59 | Admitting: Hematology

## 2021-08-18 VITALS — BP 135/84 | HR 83 | Temp 96.8°F | Resp 18 | Wt 130.4 lb

## 2021-08-18 VITALS — BP 139/75 | HR 81 | Temp 97.8°F | Resp 18

## 2021-08-18 DIAGNOSIS — C189 Malignant neoplasm of colon, unspecified: Secondary | ICD-10-CM

## 2021-08-18 DIAGNOSIS — C20 Malignant neoplasm of rectum: Secondary | ICD-10-CM | POA: Diagnosis not present

## 2021-08-18 DIAGNOSIS — Z5111 Encounter for antineoplastic chemotherapy: Secondary | ICD-10-CM | POA: Diagnosis not present

## 2021-08-18 DIAGNOSIS — Z95828 Presence of other vascular implants and grafts: Secondary | ICD-10-CM

## 2021-08-18 DIAGNOSIS — C187 Malignant neoplasm of sigmoid colon: Secondary | ICD-10-CM

## 2021-08-18 LAB — MAGNESIUM: Magnesium: 1.8 mg/dL (ref 1.7–2.4)

## 2021-08-18 LAB — CBC WITH DIFFERENTIAL/PLATELET
Abs Immature Granulocytes: 0.01 10*3/uL (ref 0.00–0.07)
Basophils Absolute: 0 10*3/uL (ref 0.0–0.1)
Basophils Relative: 0 %
Eosinophils Absolute: 0 10*3/uL (ref 0.0–0.5)
Eosinophils Relative: 1 %
HCT: 35 % — ABNORMAL LOW (ref 36.0–46.0)
Hemoglobin: 11.6 g/dL — ABNORMAL LOW (ref 12.0–15.0)
Immature Granulocytes: 0 %
Lymphocytes Relative: 28 %
Lymphs Abs: 1.9 10*3/uL (ref 0.7–4.0)
MCH: 34 pg (ref 26.0–34.0)
MCHC: 33.1 g/dL (ref 30.0–36.0)
MCV: 102.6 fL — ABNORMAL HIGH (ref 80.0–100.0)
Monocytes Absolute: 0.6 10*3/uL (ref 0.1–1.0)
Monocytes Relative: 8 %
Neutro Abs: 4.3 10*3/uL (ref 1.7–7.7)
Neutrophils Relative %: 63 %
Platelets: 257 10*3/uL (ref 150–400)
RBC: 3.41 MIL/uL — ABNORMAL LOW (ref 3.87–5.11)
RDW: 12.7 % (ref 11.5–15.5)
WBC: 6.8 10*3/uL (ref 4.0–10.5)
nRBC: 0 % (ref 0.0–0.2)

## 2021-08-18 LAB — COMPREHENSIVE METABOLIC PANEL
ALT: 13 U/L (ref 0–44)
AST: 18 U/L (ref 15–41)
Albumin: 3.7 g/dL (ref 3.5–5.0)
Alkaline Phosphatase: 64 U/L (ref 38–126)
Anion gap: 7 (ref 5–15)
BUN: 9 mg/dL (ref 6–20)
CO2: 24 mmol/L (ref 22–32)
Calcium: 9 mg/dL (ref 8.9–10.3)
Chloride: 106 mmol/L (ref 98–111)
Creatinine, Ser: 0.72 mg/dL (ref 0.44–1.00)
GFR, Estimated: >60 mL/min (ref >60)
Glucose, Bld: 102 mg/dL — ABNORMAL HIGH (ref 70–99)
Potassium: 3.7 mmol/L (ref 3.5–5.1)
Sodium: 137 mmol/L (ref 135–145)
Total Bilirubin: 0.2 mg/dL — ABNORMAL LOW (ref 0.3–1.2)
Total Protein: 7.2 g/dL (ref 6.5–8.1)

## 2021-08-18 MED ORDER — OXALIPLATIN CHEMO INJECTION 100 MG/20ML
85.0000 mg/m2 | Freq: Once | INTRAVENOUS | Status: AC
  Administered 2021-08-18: 135 mg via INTRAVENOUS
  Filled 2021-08-18: qty 10

## 2021-08-18 MED ORDER — FLUOROURACIL CHEMO INJECTION 2.5 GM/50ML
400.0000 mg/m2 | Freq: Once | INTRAVENOUS | Status: AC
  Administered 2021-08-18: 650 mg via INTRAVENOUS
  Filled 2021-08-18: qty 13

## 2021-08-18 MED ORDER — DEXTROSE 5 % IV SOLN: Freq: Once | INTRAVENOUS | Status: AC

## 2021-08-18 MED ORDER — PALONOSETRON HCL INJECTION 0.25 MG/5ML
0.2500 mg | Freq: Once | INTRAVENOUS | Status: AC
  Administered 2021-08-18: 0.25 mg via INTRAVENOUS
  Filled 2021-08-18: qty 5

## 2021-08-18 MED ORDER — SODIUM CHLORIDE 0.9 % IV SOLN
10.0000 mg | Freq: Once | INTRAVENOUS | Status: AC
  Administered 2021-08-18: 10 mg via INTRAVENOUS
  Filled 2021-08-18: qty 10

## 2021-08-18 MED ORDER — SODIUM CHLORIDE 0.9 % IV SOLN
2400.0000 mg/m2 | INTRAVENOUS | Status: DC
  Administered 2021-08-18: 3800 mg via INTRAVENOUS
  Filled 2021-08-18: qty 76

## 2021-08-18 MED ORDER — LEUCOVORIN CALCIUM INJECTION 350 MG
400.0000 mg/m2 | Freq: Once | INTRAVENOUS | Status: AC
  Administered 2021-08-18: 636 mg via INTRAVENOUS
  Filled 2021-08-18: qty 31.8

## 2021-08-18 NOTE — Patient Instructions (Signed)
[facility] at [facility] Discharge Instructions  You were seen today by Dr. Katragadda. He went over your recent results, and you received your treatment. Dr. Katragadda will see you back in 2 weeks for labs and follow up.   Thank you for choosing [facility] at [facility] to provide your oncology and hematology care.  To afford each patient quality time with our provider, please arrive at least 15 minutes before your scheduled appointment time.   If you have a lab appointment with the Cancer Center please come in thru the Main Entrance and check in at the main information desk  You need to re-schedule your appointment should you arrive 10 or more minutes late.  We strive to give you quality time with our providers, and arriving late affects you and other patients whose appointments are after yours.  Also, if you no show three or more times for appointments you may be dismissed from the clinic at the providers discretion.     Again, thank you for choosing [facility].  Our hope is that these requests will decrease the amount of time that you wait before being seen by our physicians.       _____________________________________________________________  Should you have questions after your visit to [facility], please contact our office at [phone] between the hours of 8:00 a.m. and 4:30 p.m.  Voicemails left after 4:00 p.m. will not be returned until the following business day.  For prescription refill requests, have your pharmacy contact our office and allow 72 hours.    Cancer Center Support Programs:   > Cancer Support Group  2nd Tuesday of the month 1pm-2pm, Journey Room   

## 2021-08-18 NOTE — Progress Notes (Signed)
Chaplain engaged in an initial visit with Alexandra Bright.  Alexandra Bright shared about her love story and recent marriage of one year.  She also talked about her two sons and love of art.   Alexandra Bright is well supported by her husband.    Chaplain offered listening, support and presence.    08/18/21 1100  Clinical Encounter Type  Visited With Patient  Visit Type Initial

## 2021-08-18 NOTE — Progress Notes (Signed)
Patients port flushed without difficulty.  Good blood return noted with no bruising or swelling noted at site.  Stable during access and blood draw.  Patient to remain accessed for treatment. 

## 2021-08-18 NOTE — Patient Instructions (Signed)
[facility]  Discharge Instructions: Thank you for choosing [facility] to provide your oncology and hematology care.  If you have a lab appointment with the [facility], please come in thru the Main Entrance and check in at the main information desk.  Wear comfortable clothing and clothing appropriate for easy access to any Portacath or PICC line.   We strive to give you quality time with your provider. You may need to reschedule your appointment if you arrive late (15 or more minutes).  Arriving late affects you and other patients whose appointments are after yours.  Also, if you miss three or more appointments without notifying the office, you may be dismissed from the clinic at the provider's discretion.      For prescription refill requests, have your pharmacy contact our office and allow 72 hours for refills to be completed.     To help prevent nausea and vomiting after your treatment, we encourage you to take your nausea medication as directed.  BELOW ARE SYMPTOMS THAT SHOULD BE REPORTED IMMEDIATELY: *FEVER GREATER THAN 100.4 F (38 C) OR HIGHER *CHILLS OR SWEATING *NAUSEA AND VOMITING THAT IS NOT CONTROLLED WITH YOUR NAUSEA MEDICATION *UNUSUAL SHORTNESS OF BREATH *UNUSUAL BRUISING OR BLEEDING *URINARY PROBLEMS (pain or burning when urinating, or frequent urination) *BOWEL PROBLEMS (unusual diarrhea, constipation, pain near the anus) TENDERNESS IN MOUTH AND THROAT WITH OR WITHOUT PRESENCE OF ULCERS (sore throat, sores in mouth, or a toothache) UNUSUAL RASH, SWELLING OR PAIN  UNUSUAL VAGINAL DISCHARGE OR ITCHING   Items with * indicate a potential emergency and should be followed up as soon as possible or go to the Emergency Department if any problems should occur.  Please show the CHEMOTHERAPY ALERT CARD or IMMUNOTHERAPY ALERT CARD at check-in to the Emergency Department and triage nurse.  The chemotherapy medication bag should finish at 46 hours, 96  hours, or 7 days. For example, if your pump is scheduled for 46 hours and it was put on at 4:00 p.m., it should finish at 2:00 p.m. the day it is scheduled to come off regardless of your appointment time.     Estimated time to finish at 1200.   If the display on your pump reads "Low Volume" and it is beeping, take the batteries out of the pump and come to the cancer center for it to be taken off.   If the pump alarms go off prior to the pump reading "Low Volume" then call [phone] and someone can assist you.  If the plunger comes out and the chemotherapy medication is leaking out, please use your home chemo spill kit to clean up the spill. Do NOT use paper towels or other household products.  If you have problems or questions regarding your pump, please call either [phone] (24 hours a day) or the cancer center Monday-Friday 8:00 a.m.- 4:30 p.m. at the clinic number and we will assist you. If you are unable to get assistance, then go to the nearest Emergency Department and ask the staff to contact the IV team for assistance.    Should you have questions after your visit or need to cancel or reschedule your appointment, please contact [facility] [phone]  and follow the prompts.  Office hours are 8:00 a.m. to 4:30 p.m. Monday - Friday. Please note that voicemails left after 4:00 p.m. may not be returned until the following business day.  We are closed weekends and major holidays. You have access to a  nurse at all times for urgent questions. Please call the main number to the clinic [phone] and follow the prompts.  For any non-urgent questions, you may also contact your provider using MyChart. We now offer e-Visits for anyone 60 and older to request care online for non-urgent symptoms. For details visit [email or web address].   Also download the MyChart app! Go to the app store, search "MyChart", open the app, select [facility], and log in with your MyChart username  and password.  Due to Covid, a mask is required upon entering the hospital/clinic. If you do not have a mask, one will be given to you upon arrival. For doctor visits, patients may have 1 support person aged 54 or older with them. For treatment visits, patients cannot have anyone with them due to current Covid guidelines and our immunocompromised population.

## 2021-08-18 NOTE — Progress Notes (Signed)
Patient presents today for chemotherapy treatment.  Vital signs are stable.  Labs reviewed by Dr. Delton Coombes during her office visit.  All labs are within treatment parameters.  We will proceed with treatment per MD orders.   Patient tolerated treatment well with no complaints voiced.  Teaching was done on home infusion pump.  Patient verbalized understanding.  Patient left ambulatory in stable condition.  Vital signs stable at discharge.  Follow up as scheduled.

## 2021-08-18 NOTE — Progress Notes (Signed)
Patient has been examined, vital signs and labs have been reviewed by Dr. Katragadda. ANC, Creatinine, LFTs, hemoglobin, and platelets are within treatment parameters per Dr. Katragadda. Patient is okay to proceed with treatment per M.D.   

## 2021-08-20 ENCOUNTER — Inpatient Hospital Stay (HOSPITAL_COMMUNITY): Payer: 59

## 2021-08-20 ENCOUNTER — Other Ambulatory Visit: Payer: Self-pay

## 2021-08-20 VITALS — BP 123/74 | HR 79 | Temp 98.2°F | Resp 18

## 2021-08-20 DIAGNOSIS — C20 Malignant neoplasm of rectum: Secondary | ICD-10-CM

## 2021-08-20 DIAGNOSIS — Z5111 Encounter for antineoplastic chemotherapy: Secondary | ICD-10-CM | POA: Diagnosis not present

## 2021-08-20 DIAGNOSIS — Z95828 Presence of other vascular implants and grafts: Secondary | ICD-10-CM

## 2021-08-20 MED ORDER — SODIUM CHLORIDE 0.9% FLUSH
10.0000 mL | INTRAVENOUS | Status: DC | PRN
  Administered 2021-08-20: 10 mL

## 2021-08-20 MED ORDER — HEPARIN SOD (PORK) LOCK FLUSH 100 UNIT/ML IV SOLN
500.0000 [IU] | Freq: Once | INTRAVENOUS | Status: AC | PRN
  Administered 2021-08-20: 500 [IU]

## 2021-08-20 NOTE — Patient Instructions (Signed)
[facility]  Discharge Instructions: Thank you for choosing [facility] to provide your oncology and hematology care.  If you have a lab appointment with the [facility], please come in thru the Main Entrance and check in at the main information desk.  Wear comfortable clothing and clothing appropriate for easy access to any Portacath or PICC line.   We strive to give you quality time with your provider. You may need to reschedule your appointment if you arrive late (15 or more minutes).  Arriving late affects you and other patients whose appointments are after yours.  Also, if you miss three or more appointments without notifying the office, you may be dismissed from the clinic at the provider's discretion.      For prescription refill requests, have your pharmacy contact our office and allow 72 hours for refills to be completed.    Today you received the following chemotherapy and/or immunotherapy agents 5FU pump disconnection.   To help prevent nausea and vomiting after your treatment, we encourage you to take your nausea medication as directed.  BELOW ARE SYMPTOMS THAT SHOULD BE REPORTED IMMEDIATELY: *FEVER GREATER THAN 100.4 F (38 C) OR HIGHER *CHILLS OR SWEATING *NAUSEA AND VOMITING THAT IS NOT CONTROLLED WITH YOUR NAUSEA MEDICATION *UNUSUAL SHORTNESS OF BREATH *UNUSUAL BRUISING OR BLEEDING *URINARY PROBLEMS (pain or burning when urinating, or frequent urination) *BOWEL PROBLEMS (unusual diarrhea, constipation, pain near the anus) TENDERNESS IN MOUTH AND THROAT WITH OR WITHOUT PRESENCE OF ULCERS (sore throat, sores in mouth, or a toothache) UNUSUAL RASH, SWELLING OR PAIN  UNUSUAL VAGINAL DISCHARGE OR ITCHING   Items with * indicate a potential emergency and should be followed up as soon as possible or go to the Emergency Department if any problems should occur.  Please show the CHEMOTHERAPY ALERT CARD or IMMUNOTHERAPY ALERT CARD at check-in to the  Emergency Department and triage nurse.  Should you have questions after your visit or need to cancel or reschedule your appointment, please contact [facility] [phone]  and follow the prompts.  Office hours are 8:00 a.m. to 4:30 p.m. Monday - Friday. Please note that voicemails left after 4:00 p.m. may not be returned until the following business day.  We are closed weekends and major holidays. You have access to a nurse at all times for urgent questions. Please call the main number to the clinic [phone] and follow the prompts.  For any non-urgent questions, you may also contact your provider using MyChart. We now offer e-Visits for anyone 9 and older to request care online for non-urgent symptoms. For details visit [email or web address].   Also download the MyChart app! Go to the app store, search "MyChart", open the app, select [facility], and log in with your MyChart username and password.  Due to Covid, a mask is required upon entering the hospital/clinic. If you do not have a mask, one will be given to you upon arrival. For doctor visits, patients may have 1 support person aged 54 or older with them. For treatment visits, patients cannot have anyone with them due to current Covid guidelines and our immunocompromised population.

## 2021-08-20 NOTE — Progress Notes (Signed)
Pt presents today for chemotherapy pump disconnection per provider's order. Vital signs stable and pt voiced no new complaints at this time. Needle intact and port flush easily 10 ml NS and 16ml of heparin.   Chemo pump disconnection done today per MD orders. Tolerated infusion without adverse affects. Vital signs stable. No complaints at this time. Discharged from clinic ambulatory in stable condition. Alert and oriented x 3. F/U with [facility] as scheduled.

## 2021-08-21 ENCOUNTER — Telehealth (HOSPITAL_COMMUNITY): Payer: Self-pay

## 2021-08-21 NOTE — Telephone Encounter (Signed)
24 hour follow up call- pt is doing well, no concerns or questions. Pt is eating and drinking well, no constipation or diarrhea. Patient is aware of next appt.

## 2021-08-31 NOTE — Progress Notes (Signed)
[place], [place] [address]   CLINIC:  Medical Oncology/Hematology  PCP:  Lindell Spar, MD [address] / [place] [address] [phone]   REASON FOR VISIT:  Follow-up for sigmoid adenocarcinoma  PRIOR THERAPY: none   NGS Results: not done  CURRENT THERAPY: FOLFOX every 2 weeks x 4 months  BRIEF ONCOLOGIC HISTORY:  Oncology History  Rectal cancer ([facility])  04/08/2021 Initial Diagnosis   Rectal cancer ([facility])   04/20/2021 - 04/20/2021 Chemotherapy          08/18/2021 -  Chemotherapy   Patient is on Treatment Plan : COLORECTAL FOLFOX q14d x 4 months       CANCER STAGING: Cancer Staging Rectal cancer ([place]) Staging form: Colon and Rectum, AJCC 8th Edition - Clinical stage from 04/08/2021: Stage IIIB (cT3, cN1b, cM0) - Unsigned   INTERVAL HISTORY:  [name], a 55 y.o. female, returns for routine follow-up and consideration for next cycle of chemotherapy. [place] was last seen on 08/18/2021.  Due for cycle #2 of FOLFOX today.   Overall, she tells me she has been feeling pretty well. She denies diarrhea and vomiting but reports constipation which has since been resolved with stool softener. She also reports one episode of nausea and retching during the time she was constipated, and she denies vomiting. She reports spasms in her jaw upon the first bite of food; the temperature of the food makes no difference. She reports mucous in her stool for the past 3-4 days, occasionally only mucous is present and stool is absent; she denies hematochezia. She denies tingling/numbness, and she is no longer taking Gabapentin. She also denies rectal pain.   Overall, she feels ready for next cycle of chemo today.   REVIEW OF SYSTEMS:  Review of Systems  Constitutional:  Negative for appetite change and fatigue.  Gastrointestinal:  Positive for constipation, diarrhea and nausea. Negative for blood in stool, rectal pain and vomiting.   Musculoskeletal:  Positive for myalgias (jaw spams).  Neurological:  Negative for numbness.  All other systems reviewed and are negative.  PAST MEDICAL/SURGICAL HISTORY:  Past Medical History:  Diagnosis Date   Anxiety    Colon cancer ([facility]) 01/2021   Constipation    GERD (gastroesophageal reflux disease)    Hypertension    Port-A-Cath in place 08/17/2021   Pre-diabetes    Past Surgical History:  Procedure Laterality Date   BIOPSY  02/18/2021   Procedure: BIOPSY;  Surgeon: Harvel Quale, MD;  Location: [facility];  Service: Gastroenterology;;   FACIAL FRACTURE SURGERY  1991   from [place]  02/18/2021   Procedure: FLEXIBLE SIGMOIDOSCOPY;  Surgeon: Harvel Quale, MD;  Location: [facility];  Service: Gastroenterology;;   IR IMAGING GUIDED PORT INSERTION  08/13/2021   POLYPECTOMY  02/18/2021   Procedure: POLYPECTOMY INTESTINAL;  Surgeon: Harvel Quale, MD;  Location: [facility];  Service: Gastroenterology;;   SUBMUCOSAL TATTOO INJECTION  02/18/2021   Procedure: SUBMUCOSAL TATTOO INJECTION;  Surgeon: Harvel Quale, MD;  Location: [facility];  Service: Gastroenterology;;   XI ROBOTIC ASSISTED LOWER ANTERIOR RESECTION N/A 07/10/2021   Procedure: XI ROBOTIC ASSISTED LOWER ANTERIOR RESECTION;  Surgeon: Leighton Ruff, MD;  Location: [facility];  Service: General;  Laterality: N/A;    SOCIAL HISTORY:  Social History   Socioeconomic History   Marital status: Married    Spouse name: Not on file   Number of  children: Not on file   Years of education: Not on file   Highest education level: Not on file  Occupational History   Not on file  Tobacco Use   Smoking status: Former    Packs/day: 0.50    Years: 40.00    Pack years: 20.00    Types: Cigarettes    Quit date: 2020    Years since quitting: 2.7   Smokeless tobacco: Never  Vaping Use   Vaping Use: Never used  Substance and Sexual Activity   Alcohol  use: Not Currently   Drug use: Never   Sexual activity: Not Currently  Other Topics Concern   Not on file  Social History Narrative   Not on file   Social Determinants of Health   Financial Resource Strain: Low Risk    Difficulty of Paying Living Expenses: Not hard at all  Food Insecurity: No Food Insecurity   Worried About Charity fundraiser in the Last Year: Never true   [place] in the Last Year: Never true  Transportation Needs: No Transportation Needs   Lack of Transportation (Medical): No   Lack of Transportation (Non-Medical): No  Physical Activity: Insufficiently Active   Days of Exercise per Week: 3 days   Minutes of Exercise per Session: 20 min  Stress: No Stress Concern Present   Feeling of Stress : Only a little  Social Connections: Engineer, building services of Communication with Friends and Family: More than three times a week   Frequency of Social Gatherings with Friends and Family: More than three times a week   Attends Religious Services: More than 4 times per year   Active Member of [employer] or Organizations: Yes   Attends Music therapist: More than 4 times per year   Marital Status: Married  Human resources officer Violence: Not At Risk   Fear of Current or Ex-Partner: No   Emotionally Abused: No   Physically Abused: No   Sexually Abused: No    FAMILY HISTORY:  Family History  Problem Relation Age of Onset   Diabetes Sister    Cancer Maternal Aunt    Ovarian cancer Cousin    Ovarian cancer Paternal Aunt    Prostate cancer Cousin     CURRENT MEDICATIONS:  Current Outpatient Medications  Medication Sig Dispense Refill   buPROPion (WELLBUTRIN SR) 200 MG 12 hr tablet Take 1 tablet (200 mg total) by mouth 2 (two) times daily. 60 tablet 5   dicyclomine (BENTYL) 10 MG capsule Take 10 mg by mouth 2 (two) times daily as needed for spasms.     diphenhydrAMINE (BENADRYL) 25 MG tablet Take 25 mg by mouth every 6 (six) hours as needed for  allergies.     fluorouracil CALGB 58099 2,400 mg/m2 in sodium chloride 0.9 % 150 mL Inject 2,400 mg/m2 into the vein over 48 hr.     FLUOROURACIL IV Inject into the vein every 14 (fourteen) days.     LEUCOVORIN CALCIUM IV Inject into the vein every 14 (fourteen) days.     lidocaine-prilocaine (EMLA) cream Apply a small amount to port a cath site and cover with plastic wrap 1 hour prior to chemotherapy appointments 30 g 3   loperamide (IMODIUM) 1 MG/5ML solution Take 2 mg by mouth as needed for diarrhea or loose stools.     losartan (COZAAR) 50 MG tablet Take 1 tablet by mouth once daily (Patient taking differently: Take 50 mg by mouth daily.) 90 tablet  0   Multiple Vitamins-Minerals (MULTIVITAMIN WITH MINERALS) tablet Take 1 tablet by mouth in the morning. Women's Multivitamin     OXALIPLATIN IV Inject into the vein every 14 (fourteen) days.     prochlorperazine (COMPAZINE) 10 MG tablet Take 1 tablet (10 mg total) by mouth every 6 (six) hours as needed (Nausea or vomiting). 30 tablet 1   sennosides-docusate sodium (SENOKOT-S) 8.6-50 MG tablet Take 1 tablet by mouth daily as needed for constipation.     simethicone (MYLICON) 80 MG chewable tablet Chew 80 mg by mouth every 6 (six) hours as needed for flatulence.     traMADol (ULTRAM) 50 MG tablet Take 1 tablet (50 mg total) by mouth 2 (two) times daily as needed. 60 tablet 0   No current facility-administered medications for this visit.    ALLERGIES:  No Known Allergies  PHYSICAL EXAM:  Performance status (ECOG): 1 - Symptomatic but completely ambulatory  There were no vitals filed for this visit. Wt Readings from Last 3 Encounters:  08/18/21 130 lb 6.4 oz (59.1 kg)  08/17/21 129 lb 0.6 oz (58.5 kg)  08/13/21 126 lb (57.2 kg)   Physical Exam Vitals reviewed.  Constitutional:      Appearance: Normal appearance.  Cardiovascular:     Rate and Rhythm: Normal rate and regular rhythm.     Pulses: Normal pulses.     Heart sounds: Normal  heart sounds.  Pulmonary:     Effort: Pulmonary effort is normal.     Breath sounds: Normal breath sounds.  Neurological:     General: No focal deficit present.     Mental Status: She is alert and oriented to person, place, and time.  Psychiatric:        Mood and Affect: Mood normal.        Behavior: Behavior normal.    LABORATORY DATA:  I have reviewed the labs as listed.  CBC Latest Ref Rng & Units 08/18/2021 07/12/2021 07/11/2021  WBC 4.0 - 10.5 K/uL 6.8 9.2 13.5(H)  Hemoglobin 12.0 - 15.0 g/dL 11.6(L) 9.6(L) 9.9(L)  Hematocrit 36.0 - 46.0 % 35.0(L) 30.1(L) 31.0(L)  Platelets 150 - 400 K/uL 257 209 228   CMP Latest Ref Rng & Units 08/18/2021 07/12/2021 07/11/2021  Glucose 70 - 99 mg/dL 102(H) 72 180(H)  BUN 6 - 20 mg/dL 9 <5(L) <5(L)  Creatinine 0.44 - 1.00 mg/dL 0.72 0.75 0.85  Sodium 135 - 145 mmol/L 137 139 139  Potassium 3.5 - 5.1 mmol/L 3.7 3.6 4.3  Chloride 98 - 111 mmol/L 106 106 106  CO2 22 - 32 mmol/L _0 Calcium 8.9 - 10.3 mg/dL 9.0 8.8(L) 9.1  Total Protein 6.5 - 8.1 g/dL 7.2 - -  Total Bilirubin 0.3 - 1.2 mg/dL 0.2(L) - -  Alkaline Phos 38 - 126 U/L 64 - -  AST 15 - 41 U/L 18 - -  ALT 0 - 44 U/L 13 - -    DIAGNOSTIC IMAGING:  I have independently reviewed the scans and discussed with the patient. IR IMAGING GUIDED PORT INSERTION  Result Date: 08/13/2021 INDICATION: History of rectal cancer. Poor venous access. In need of durable intravenous access for chemotherapy administration. EXAM: IMPLANTED PORT A CATH PLACEMENT WITH ULTRASOUND AND FLUOROSCOPIC GUIDANCE COMPARISON:  CT the chest, abdomen and pelvis-06/29/2021 MEDICATIONS: None ANESTHESIA/SEDATION: Moderate (conscious) sedation was employed during this procedure. A total of Versed 2 mg and Fentanyl 75 mcg was administered intravenously. Moderate Sedation Time: 27 minutes. The patient's level of consciousness  and vital signs were monitored continuously by radiology nursing throughout the procedure under my  direct supervision. CONTRAST:  None FLUOROSCOPY TIME:  30 seconds (4 mGy) COMPLICATIONS: None immediate. PROCEDURE: The procedure, risks, benefits, and alternatives were explained to the patient. Questions regarding the procedure were encouraged and answered. The patient understands and consents to the procedure. The right neck and chest were prepped with chlorhexidine in a sterile fashion, and a sterile drape was applied covering the operative field. Maximum barrier sterile technique with sterile gowns and gloves were used for the procedure. A timeout was performed prior to the initiation of the procedure. Local anesthesia was provided with 1% lidocaine with epinephrine. After creating a small venotomy incision, a micropuncture kit was utilized to access the internal jugular vein. Real-time ultrasound guidance was utilized for vascular access including the acquisition of a permanent ultrasound image documenting patency of the accessed vessel. The microwire was utilized to measure appropriate catheter length. A subcutaneous port pocket was then created along the upper chest wall utilizing a combination of sharp and blunt dissection. The pocket was irrigated with sterile saline. A single lumen slim sized power injectable port was chosen for placement. The 8 Fr catheter was tunneled from the port pocket site to the venotomy incision. The port was placed in the pocket. The external catheter was trimmed to appropriate length. At the venotomy, an 8 Fr peel-away sheath was placed over a guidewire under fluoroscopic guidance. The catheter was then placed through the sheath and the sheath was removed. Final catheter positioning was confirmed and documented with a fluoroscopic spot radiograph. The port was accessed with a Huber needle, aspirated and flushed with heparinized saline. The venotomy site was closed with an interrupted 4-0 Vicryl suture. The port pocket incision was closed with interrupted 2-0 Vicryl suture.  Dermabond and Steri-strips were applied to both incisions. Dressings were applied. The patient tolerated the procedure well without immediate post procedural complication. FINDINGS: After catheter placement, the tip lies within the superior cavoatrial junction. The catheter aspirates and flushes normally and is ready for immediate use. IMPRESSION: Successful placement of a right internal jugular approach power injectable Port-A-Cath. The catheter is ready for immediate use. Electronically Signed   By: Sandi Mariscal M.D.   On: 08/13/2021 11:06     ASSESSMENT:  1.  Rectosigmoid sigmoid adenocarcinoma: - Presentation with the bleeding per rectum for the last 5 to 6 months and constipation. - Colonoscopy on 02/18/2021 showed ulcerated partially obstructing mass found from 12-17 cm from the anal verge, circumferential measuring 5 cm in length. - Biopsy consistent with adenocarcinoma. - CT CAP on 03/06/2021 with circumferential wall thickening, hyperenhancement and fat stranding about the rectosigmoid junction, mass measuring approximately 6 cm in length.  No evidence of lymphadenopathy or metastatic disease. - CEA on 02/19/2021 was 1.8. - MRI of the pelvis on 04/06/2021 shows high rectal T3c/T4AN1 malignancy. - Chemoradiation therapy with Xeloda from 04/22/2021 through 06/01/2021. - CT CAP on 06/29/2021 showed improvement in the rectosigmoid thickening at the site of malignancy.  No clear adenopathy.  No evidence of metastatic disease. - Robotic assisted LAR on 07/10/2021 with pathology showing 2 cm grade 2, margins negative, 1/18 lymph nodes positive, no tumor deposits, positive treatment effect, no perineural invasion, no lymphovascular invasion, [id number], MMR preserved - She will need completion colonoscopy after adjuvant therapy.   2.  Social/family history: - She worked as a Secretary/administrator and recently stopped working.  She quit smoking in October 2021, smoked 1 pack/day for  40 years. - Maternal aunt had lung  cancer and was a non-smoker.  Paternal aunt had cancer, type not known to the patient.   PLAN:  1.  T3c/T4N1 rectosigmoid  adenocarcinoma: - She has tolerated first cycle of FOLFOX reasonably well. - She had some constipation.  She also had spasm of teeth in the back of the jaws, at first bite of food lasting for first 4 days.  Denies any tingling or numbness.  Denies any mucositis. - She also reported bowel movements being mucousy in the last 3 to 4 days. - Reviewed labs today which showed white count is 3.6 with ANC of 1.7.  ALT is slightly elevated at 50.  Other LFTs are normal. - She will proceed with cycle 2 without any dose modifications.  As her white count is borderline low, I have recommended adding Fulphila on day 3.  She was told to take Claritin daily to decrease her bone pains.  RTC 2 weeks for follow-up.     2.  Rectal pain: - She is no longer requiring tramadol.   3.  Peripheral neuropathy: - She has mild numbness in the legs after COVID-vaccine which is stable.  She is no longer taking gabapentin.  No worsening after last chemo.   Orders placed this encounter:  No orders of the defined types were placed in this encounter.    Derek Jack, MD [place] [phone]   I, Thana Ates, am acting as a scribe for Dr. Derek Jack.  I, Derek Jack MD, have reviewed the above documentation for accuracy and completeness, and I agree with the above.

## 2021-09-01 ENCOUNTER — Inpatient Hospital Stay (HOSPITAL_COMMUNITY): Payer: 59 | Attending: Hematology

## 2021-09-01 ENCOUNTER — Inpatient Hospital Stay (HOSPITAL_BASED_OUTPATIENT_CLINIC_OR_DEPARTMENT_OTHER): Payer: 59 | Admitting: Hematology

## 2021-09-01 ENCOUNTER — Other Ambulatory Visit: Payer: Self-pay

## 2021-09-01 ENCOUNTER — Inpatient Hospital Stay (HOSPITAL_COMMUNITY): Payer: 59 | Attending: Hematology and Oncology

## 2021-09-01 VITALS — BP 129/82 | HR 73 | Temp 98.1°F | Resp 16

## 2021-09-01 VITALS — BP 118/81 | HR 95 | Temp 97.1°F | Resp 18 | Wt 131.3 lb

## 2021-09-01 DIAGNOSIS — Z5111 Encounter for antineoplastic chemotherapy: Secondary | ICD-10-CM | POA: Diagnosis present

## 2021-09-01 DIAGNOSIS — C187 Malignant neoplasm of sigmoid colon: Secondary | ICD-10-CM

## 2021-09-01 DIAGNOSIS — C19 Malignant neoplasm of rectosigmoid junction: Secondary | ICD-10-CM | POA: Insufficient documentation

## 2021-09-01 DIAGNOSIS — G893 Neoplasm related pain (acute) (chronic): Secondary | ICD-10-CM | POA: Insufficient documentation

## 2021-09-01 DIAGNOSIS — C20 Malignant neoplasm of rectum: Secondary | ICD-10-CM

## 2021-09-01 DIAGNOSIS — Z95828 Presence of other vascular implants and grafts: Secondary | ICD-10-CM

## 2021-09-01 DIAGNOSIS — Z79899 Other long term (current) drug therapy: Secondary | ICD-10-CM | POA: Diagnosis not present

## 2021-09-01 LAB — CBC WITH DIFFERENTIAL/PLATELET
Abs Immature Granulocytes: 0.01 10*3/uL (ref 0.00–0.07)
Basophils Absolute: 0 10*3/uL (ref 0.0–0.1)
Basophils Relative: 1 %
Eosinophils Absolute: 0.1 10*3/uL (ref 0.0–0.5)
Eosinophils Relative: 2 %
HCT: 33.5 % — ABNORMAL LOW (ref 36.0–46.0)
Hemoglobin: 11.1 g/dL — ABNORMAL LOW (ref 12.0–15.0)
Immature Granulocytes: 0 %
Lymphocytes Relative: 37 %
Lymphs Abs: 1.3 10*3/uL (ref 0.7–4.0)
MCH: 33.3 pg (ref 26.0–34.0)
MCHC: 33.1 g/dL (ref 30.0–36.0)
MCV: 100.6 fL — ABNORMAL HIGH (ref 80.0–100.0)
Monocytes Absolute: 0.5 10*3/uL (ref 0.1–1.0)
Monocytes Relative: 14 %
Neutro Abs: 1.7 10*3/uL (ref 1.7–7.7)
Neutrophils Relative %: 46 %
Platelets: 240 10*3/uL (ref 150–400)
RBC: 3.33 MIL/uL — ABNORMAL LOW (ref 3.87–5.11)
RDW: 12.2 % (ref 11.5–15.5)
WBC: 3.6 10*3/uL — ABNORMAL LOW (ref 4.0–10.5)
nRBC: 0 % (ref 0.0–0.2)

## 2021-09-01 LAB — COMPREHENSIVE METABOLIC PANEL
ALT: 50 U/L — ABNORMAL HIGH (ref 0–44)
AST: 29 U/L (ref 15–41)
Albumin: 3.5 g/dL (ref 3.5–5.0)
Alkaline Phosphatase: 76 U/L (ref 38–126)
Anion gap: 7 (ref 5–15)
BUN: 9 mg/dL (ref 6–20)
CO2: 24 mmol/L (ref 22–32)
Calcium: 8.6 mg/dL — ABNORMAL LOW (ref 8.9–10.3)
Chloride: 107 mmol/L (ref 98–111)
Creatinine, Ser: 0.78 mg/dL (ref 0.44–1.00)
GFR, Estimated: >60 mL/min (ref >60)
Glucose, Bld: 116 mg/dL — ABNORMAL HIGH (ref 70–99)
Potassium: 3.6 mmol/L (ref 3.5–5.1)
Sodium: 138 mmol/L (ref 135–145)
Total Bilirubin: 0.4 mg/dL (ref 0.3–1.2)
Total Protein: 6.4 g/dL — ABNORMAL LOW (ref 6.5–8.1)

## 2021-09-01 LAB — MAGNESIUM: Magnesium: 1.7 mg/dL (ref 1.7–2.4)

## 2021-09-01 MED ORDER — OXALIPLATIN CHEMO INJECTION 100 MG/20ML
85.0000 mg/m2 | Freq: Once | INTRAVENOUS | Status: AC
  Administered 2021-09-01: 135 mg via INTRAVENOUS
  Filled 2021-09-01: qty 10

## 2021-09-01 MED ORDER — SODIUM CHLORIDE 0.9 % IV SOLN
10.0000 mg | Freq: Once | INTRAVENOUS | Status: AC
  Administered 2021-09-01: 10 mg via INTRAVENOUS
  Filled 2021-09-01: qty 10

## 2021-09-01 MED ORDER — DEXTROSE 5 % IV SOLN: Freq: Once | INTRAVENOUS | Status: AC

## 2021-09-01 MED ORDER — SODIUM CHLORIDE 0.9 % IV SOLN
2400.0000 mg/m2 | INTRAVENOUS | Status: DC
  Administered 2021-09-01: 3800 mg via INTRAVENOUS
  Filled 2021-09-01: qty 76

## 2021-09-01 MED ORDER — LEUCOVORIN CALCIUM INJECTION 350 MG
400.0000 mg/m2 | Freq: Once | INTRAVENOUS | Status: AC
  Administered 2021-09-01: 636 mg via INTRAVENOUS
  Filled 2021-09-01: qty 31.8

## 2021-09-01 MED ORDER — PALONOSETRON HCL INJECTION 0.25 MG/5ML
0.2500 mg | Freq: Once | INTRAVENOUS | Status: AC
  Administered 2021-09-01: 0.25 mg via INTRAVENOUS
  Filled 2021-09-01: qty 5

## 2021-09-01 MED ORDER — FLUOROURACIL CHEMO INJECTION 2.5 GM/50ML
400.0000 mg/m2 | Freq: Once | INTRAVENOUS | Status: AC
  Administered 2021-09-01: 650 mg via INTRAVENOUS
  Filled 2021-09-01: qty 13

## 2021-09-01 NOTE — Progress Notes (Signed)
Patients port flushed without difficulty.  Good blood return noted with no bruising or swelling noted at site.  Stable during access and blood draw.  Patient to remain accessed for treatment. 

## 2021-09-01 NOTE — Patient Instructions (Signed)
[facility]  Discharge Instructions: Thank you for choosing [facility] to provide your oncology and hematology care.  If you have a lab appointment with the Cancer Center, please come in thru the Main Entrance and check in at the main information desk.  Wear comfortable clothing and clothing appropriate for easy access to any Portacath or PICC line.   We strive to give you quality time with your provider. You may need to reschedule your appointment if you arrive late (15 or more minutes).  Arriving late affects you and other patients whose appointments are after yours.  Also, if you miss three or more appointments without notifying the office, you may be dismissed from the clinic at the provider's discretion.      For prescription refill requests, have your pharmacy contact our office and allow 72 hours for refills to be completed.        To help prevent nausea and vomiting after your treatment, we encourage you to take your nausea medication as directed.  BELOW ARE SYMPTOMS THAT SHOULD BE REPORTED IMMEDIATELY: *FEVER GREATER THAN 100.4 F (38 C) OR HIGHER *CHILLS OR SWEATING *NAUSEA AND VOMITING THAT IS NOT CONTROLLED WITH YOUR NAUSEA MEDICATION *UNUSUAL SHORTNESS OF BREATH *UNUSUAL BRUISING OR BLEEDING *URINARY PROBLEMS (pain or burning when urinating, or frequent urination) *BOWEL PROBLEMS (unusual diarrhea, constipation, pain near the anus) TENDERNESS IN MOUTH AND THROAT WITH OR WITHOUT PRESENCE OF ULCERS (sore throat, sores in mouth, or a toothache) UNUSUAL RASH, SWELLING OR PAIN  UNUSUAL VAGINAL DISCHARGE OR ITCHING   Items with * indicate a potential emergency and should be followed up as soon as possible or go to the Emergency Department if any problems should occur.  Please show the CHEMOTHERAPY ALERT CARD or IMMUNOTHERAPY ALERT CARD at check-in to the Emergency Department and triage nurse.  Should you have questions after your visit or need to cancel  or reschedule your appointment, please contact [facility] [phone]  and follow the prompts.  Office hours are 8:00 a.m. to 4:30 p.m. Monday - Friday. Please note that voicemails left after 4:00 p.m. may not be returned until the following business day.  We are closed weekends and major holidays. You have access to a nurse at all times for urgent questions. Please call the main number to the clinic [phone] and follow the prompts.  For any non-urgent questions, you may also contact your provider using MyChart. We now offer e-Visits for anyone 18 and older to request care online for non-urgent symptoms. For details visit [email or web address].   Also download the MyChart app! Go to the app store, search "MyChart", open the app, select [facility], and log in with your MyChart username and password.  Due to Covid, a mask is required upon entering the hospital/clinic. If you do not have a mask, one will be given to you upon arrival. For doctor visits, patients may have 1 support person aged 18 or older with them. For treatment visits, patients cannot have anyone with them due to current Covid guidelines and our immunocompromised population.  

## 2021-09-01 NOTE — Progress Notes (Signed)
The following biosimilar Udenyca ([id number]) has been selected for use in this patient due to insurance.  Henreitta Leber, PharmD

## 2021-09-01 NOTE — Patient Instructions (Signed)
  [place] at [facility] Discharge Instructions  You were seen and examined by Dr. Delton Coombes today.  You will receive treatment today and in 2 weeks. We will add a white cell booster shot to be given on the days we take off your pump. Take Claritin once a day to help with aches and pains that the shot may cause. Return as scheduled for labs, office visit, and treatment.    Thank you for choosing [place] at [facility] to provide your oncology and hematology care.  To afford each patient quality time with our provider, please arrive at least 15 minutes before your scheduled appointment time.   If you have a lab appointment with the [place] please come in thru the Main Entrance and check in at the main information desk.  You need to re-schedule your appointment should you arrive 10 or more minutes late.  We strive to give you quality time with our providers, and arriving late affects you and other patients whose appointments are after yours.  Also, if you no show three or more times for appointments you may be dismissed from the clinic at the providers discretion.     Again, thank you for choosing [facility].  Our hope is that these requests will decrease the amount of time that you wait before being seen by our physicians.       _____________________________________________________________  Should you have questions after your visit to [facility], please contact our office at [phone] and follow the prompts.  Our office hours are 8:00 a.m. and 4:30 p.m. Monday - Friday.  Please note that voicemails left after 4:00 p.m. may not be returned until the following business day.  We are closed weekends and major holidays.  You do have access to a nurse 24-7, just call the main number to the clinic [phone] and do not press any options, hold on the line and a nurse will answer the phone.    For prescription  refill requests, have your pharmacy contact our office and allow 72 hours.    Due to Covid, you will need to wear a mask upon entering the hospital. If you do not have a mask, a mask will be given to you at the Main Entrance upon arrival. For doctor visits, patients may have 1 support person age 51 or older with them. For treatment visits, patients can not have anyone with them due to social distancing guidelines and our immunocompromised population.

## 2021-09-01 NOTE — Progress Notes (Signed)
Patient has been examined, vital signs and labs have been reviewed by Dr. Katragadda. ANC, Creatinine, LFTs, hemoglobin, and platelets are within treatment parameters per Dr. Katragadda. Patient may proceed with treatment per M.D.   

## 2021-09-01 NOTE — Progress Notes (Signed)
Patient presents today for chemotherapy infusion.  Patient is in satisfactory condition with no complaints voiced.  Vital signs are stable.  Labs reviewed by Dr. Delton Coombes during her office visit.  Labs are all within treatment parameters.  We will proceed with treatment per MD orders.    Patient tolerated treatment well with no complaints voiced.  Patient homebound on 5FU pump. Patient denied any questions regarding pump.  Patient left ambulatory in stable condition.  Vital signs stable at discharge.  Follow up as scheduled.

## 2021-09-03 ENCOUNTER — Other Ambulatory Visit: Payer: Self-pay

## 2021-09-03 ENCOUNTER — Inpatient Hospital Stay (HOSPITAL_COMMUNITY): Payer: 59

## 2021-09-03 VITALS — BP 127/87 | HR 88 | Temp 98.1°F | Resp 20

## 2021-09-03 DIAGNOSIS — C20 Malignant neoplasm of rectum: Secondary | ICD-10-CM

## 2021-09-03 DIAGNOSIS — Z5111 Encounter for antineoplastic chemotherapy: Secondary | ICD-10-CM | POA: Diagnosis not present

## 2021-09-03 DIAGNOSIS — Z95828 Presence of other vascular implants and grafts: Secondary | ICD-10-CM

## 2021-09-03 MED ORDER — SODIUM CHLORIDE 0.9% FLUSH
10.0000 mL | INTRAVENOUS | Status: DC | PRN
  Administered 2021-09-03: 10 mL

## 2021-09-03 MED ORDER — HEPARIN SOD (PORK) LOCK FLUSH 100 UNIT/ML IV SOLN
500.0000 [IU] | Freq: Once | INTRAVENOUS | Status: AC | PRN
  Administered 2021-09-03: 500 [IU]

## 2021-09-03 MED ORDER — PEGFILGRASTIM-CBQV 6 MG/0.6ML ~~LOC~~ SOSY
6.0000 mg | PREFILLED_SYRINGE | Freq: Once | SUBCUTANEOUS | Status: AC
  Administered 2021-09-03: 6 mg via SUBCUTANEOUS
  Filled 2021-09-03: qty 0.6

## 2021-09-07 ENCOUNTER — Encounter (HOSPITAL_COMMUNITY): Payer: Self-pay

## 2021-09-07 NOTE — Progress Notes (Signed)
Patient called complaining of excessive burping and gas. She is also complaining of burning in her throat.   Spoke with Dr. Delton Coombes and he recommends patient taking Maloxx every 3 hrs as needed and Prilosec.   Patient is aware and agreeable to plan.

## 2021-09-14 NOTE — Progress Notes (Signed)
[place], [place] [address]   CLINIC:  Medical Oncology/Hematology  PCP:  Lindell Spar, MD [address] / [place] [address] [phone]   REASON FOR VISIT:  Follow-up for sigmoid adenocarcinoma  PRIOR THERAPY: none  NGS Results: not done  CURRENT THERAPY: FOLFOX every 2 weeks x 4 months  BRIEF ONCOLOGIC HISTORY:  Oncology History  Rectal cancer ([place])  04/08/2021 Initial Diagnosis   Rectal cancer ([place])   04/20/2021 - 04/20/2021 Chemotherapy          08/18/2021 -  Chemotherapy   Patient is on Treatment Plan : COLORECTAL FOLFOX q14d x 4 months       CANCER STAGING: Cancer Staging Rectal cancer ([place]) Staging form: Colon and Rectum, AJCC 8th Edition - Clinical stage from 04/08/2021: Stage IIIB (cT3, cN1b, cM0) - Unsigned   INTERVAL HISTORY:  Ms. Melia Case, a 55 y.o. female, returns for routine follow-up and consideration for next cycle of chemotherapy. Faatimah was last seen on 09/01/2021.  Due for cycle #3 of FOLFOX today.   Overall, she tells me she has been feeling pretty well. She reports hair loss over the past week, and CP which is helped with Prilosec. She continues to have mucous present in her stool; occasionally the BM only contains mucous with no stool. Her numbness in her feet has resolved. She reports dry heaving, nausea helped with compazine, and constipation. She has lost 2 pounds since her last visit. She reports severe fatigue and headaches for the week following her last treatment. She reports tingling in her mouth upon the first bite or sip of any food or drink regardless of temperature which subsides with subsequent ingestion although she does report she can no longer tolerate eating ice cream as it causes pain upon ingestion. She reports occasional mouth sores within the first week following treatment.   Overall, she feels ready for next cycle of chemo today.   REVIEW OF SYSTEMS:  Review of Systems   Constitutional:  Positive for unexpected weight change (-2 lbs). Negative for appetite change and fatigue (50%).  HENT:   Positive for mouth sores.        Sneezing  Cardiovascular:  Positive for chest pain.  Gastrointestinal:  Positive for abdominal pain, constipation, diarrhea, nausea (dry heaving) and vomiting.  Neurological:  Positive for headaches. Negative for numbness.  Psychiatric/Behavioral:  Positive for sleep disturbance.   All other systems reviewed and are negative.  PAST MEDICAL/SURGICAL HISTORY:  Past Medical History:  Diagnosis Date   Anxiety    Colon cancer ([place]) 01/2021   Constipation    GERD (gastroesophageal reflux disease)    Hypertension    Port-A-Cath in place 08/17/2021   Pre-diabetes    Past Surgical History:  Procedure Laterality Date   BIOPSY  02/18/2021   Procedure: BIOPSY;  Surgeon: Harvel Quale, MD;  Location: [facility];  Service: Gastroenterology;;   FACIAL FRACTURE SURGERY  1991   from [place]  02/18/2021   Procedure: FLEXIBLE SIGMOIDOSCOPY;  Surgeon: Harvel Quale, MD;  Location: [facility];  Service: Gastroenterology;;   IR IMAGING GUIDED PORT INSERTION  08/13/2021   POLYPECTOMY  02/18/2021   Procedure: POLYPECTOMY INTESTINAL;  Surgeon: Harvel Quale, MD;  Location: [facility];  Service: Gastroenterology;;   SUBMUCOSAL TATTOO INJECTION  02/18/2021   Procedure: SUBMUCOSAL TATTOO INJECTION;  Surgeon: Harvel Quale, MD;  Location: [facility];  Service: Gastroenterology;;  XI ROBOTIC ASSISTED LOWER ANTERIOR RESECTION N/A 07/10/2021   Procedure: XI ROBOTIC ASSISTED LOWER ANTERIOR RESECTION;  Surgeon: Leighton Ruff, MD;  Location: [facility];  Service: General;  Laterality: N/A;    SOCIAL HISTORY:  Social History   Socioeconomic History   Marital status: Married    Spouse name: Not on file   Number of children: Not on file   Years of education: Not on file   Highest  education level: Not on file  Occupational History   Not on file  Tobacco Use   Smoking status: Former    Packs/day: 0.50    Years: 40.00    Pack years: 20.00    Types: Cigarettes    Quit date: 2020    Years since quitting: 2.8   Smokeless tobacco: Never  Vaping Use   Vaping Use: Never used  Substance and Sexual Activity   Alcohol use: Not Currently   Drug use: Never   Sexual activity: Not Currently  Other Topics Concern   Not on file  Social History Narrative   Not on file   Social Determinants of Health   Financial Resource Strain: Low Risk    Difficulty of Paying Living Expenses: Not hard at all  Food Insecurity: No Food Insecurity   Worried About Charity fundraiser in the Last Year: Never true   Ran Out of Food in the Last Year: Never true  Transportation Needs: No Transportation Needs   Lack of Transportation (Medical): No   Lack of Transportation (Non-Medical): No  Physical Activity: Insufficiently Active   Days of Exercise per Week: 3 days   Minutes of Exercise per Session: 20 min  Stress: No Stress Concern Present   Feeling of Stress : Only a little  Social Connections: Engineer, building services of Communication with Friends and Family: More than three times a week   Frequency of Social Gatherings with Friends and Family: More than three times a week   Attends Religious Services: More than 4 times per year   Active Member of [employer] or Organizations: Yes   Attends Music therapist: More than 4 times per year   Marital Status: Married  Human resources officer Violence: Not At Risk   Fear of Current or Ex-Partner: No   Emotionally Abused: No   Physically Abused: No   Sexually Abused: No    FAMILY HISTORY:  Family History  Problem Relation Age of Onset   Diabetes Sister    Cancer Maternal Aunt    Ovarian cancer Cousin    Ovarian cancer Paternal Aunt    Prostate cancer Cousin     CURRENT MEDICATIONS:  Current Outpatient Medications   Medication Sig Dispense Refill   buPROPion (WELLBUTRIN SR) 200 MG 12 hr tablet Take 1 tablet (200 mg total) by mouth 2 (two) times daily. 60 tablet 5   dicyclomine (BENTYL) 10 MG capsule Take 10 mg by mouth 2 (two) times daily as needed for spasms.     diphenhydrAMINE (BENADRYL) 25 MG tablet Take 25 mg by mouth every 6 (six) hours as needed for allergies.     fluorouracil CALGB 20233 2,400 mg/m2 in sodium chloride 0.9 % 150 mL Inject 2,400 mg/m2 into the vein over 48 hr.     FLUOROURACIL IV Inject into the vein every 14 (fourteen) days.     LEUCOVORIN CALCIUM IV Inject into the vein every 14 (fourteen) days.     loperamide (IMODIUM) 1 MG/5ML solution Take 2 mg by mouth as  needed for diarrhea or loose stools.     losartan (COZAAR) 50 MG tablet Take 1 tablet by mouth once daily (Patient taking differently: Take 50 mg by mouth daily.) 90 tablet 0   Multiple Vitamins-Minerals (MULTIVITAMIN WITH MINERALS) tablet Take 1 tablet by mouth in the morning. Women's Multivitamin     OXALIPLATIN IV Inject into the vein every 14 (fourteen) days.     sennosides-docusate sodium (SENOKOT-S) 8.6-50 MG tablet Take 1 tablet by mouth daily as needed for constipation.     simethicone (MYLICON) 80 MG chewable tablet Chew 80 mg by mouth every 6 (six) hours as needed for flatulence.     traMADol (ULTRAM) 50 MG tablet Take 1 tablet (50 mg total) by mouth 2 (two) times daily as needed. 60 tablet 0   lidocaine-prilocaine (EMLA) cream Apply a small amount to port a cath site and cover with plastic wrap 1 hour prior to chemotherapy appointments (Patient not taking: Reported on 09/15/2021) 30 g 3   prochlorperazine (COMPAZINE) 10 MG tablet Take 1 tablet (10 mg total) by mouth every 6 (six) hours as needed (Nausea or vomiting). (Patient not taking: Reported on 09/15/2021) 30 tablet 1   No current facility-administered medications for this visit.   Facility-Administered Medications Ordered in Other Visits  Medication Dose Route  Frequency Provider Last Rate Last Admin   sodium chloride flush (NS) 0.9 % injection 10 mL  10 mL Intracatheter PRN Derek Jack, MD   10 mL at 09/03/21 1337    ALLERGIES:  No Known Allergies  PHYSICAL EXAM:  Performance status (ECOG): 1 - Symptomatic but completely ambulatory  Vitals:   09/15/21 0909  BP: 139/87  Pulse: 95  Resp: 18  Temp: 98.3 F (36.8 C)  SpO2: 97%   Wt Readings from Last 3 Encounters:  09/15/21 129 lb 3.2 oz (58.6 kg)  09/01/21 131 lb 4.8 oz (59.6 kg)  08/18/21 130 lb 6.4 oz (59.1 kg)   Physical Exam Vitals reviewed.  Constitutional:      Appearance: Normal appearance.  Cardiovascular:     Rate and Rhythm: Normal rate and regular rhythm.     Pulses: Normal pulses.     Heart sounds: Normal heart sounds.  Pulmonary:     Effort: Pulmonary effort is normal.     Breath sounds: Normal breath sounds.  Neurological:     General: No focal deficit present.     Mental Status: She is alert and oriented to person, place, and time.  Psychiatric:        Mood and Affect: Mood normal.        Behavior: Behavior normal.    LABORATORY DATA:  I have reviewed the labs as listed.  CBC Latest Ref Rng & Units 09/15/2021 09/01/2021 08/18/2021  WBC 4.0 - 10.5 K/uL 9.9 3.6(L) 6.8  Hemoglobin 12.0 - 15.0 g/dL 10.7(L) 11.1(L) 11.6(L)  Hematocrit 36.0 - 46.0 % 32.3(L) 33.5(L) 35.0(L)  Platelets 150 - 400 K/uL 150 240 257   CMP Latest Ref Rng & Units 09/15/2021 09/01/2021 08/18/2021  Glucose 70 - 99 mg/dL 125(H) 116(H) 102(H)  BUN 6 - 20 mg/dL 11 9 9   Creatinine 0.44 - 1.00 mg/dL 0.77 0.78 0.72  Sodium 135 - 145 mmol/L 138 138 137  Potassium 3.5 - 5.1 mmol/L 3.3(L) 3.6 3.7  Chloride 98 - 111 mmol/L 105 107 106  CO2 22 - 32 mmol/L 27 24 24   Calcium 8.9 - 10.3 mg/dL 8.4(L) 8.6(L) 9.0  Total Protein 6.5 - 8.1 g/dL 6.3(L)  6.4(L) 7.2  Total Bilirubin 0.3 - 1.2 mg/dL <0.1(L) 0.4 0.2(L)  Alkaline Phos 38 - 126 U/L 93 76 64  AST 15 - 41 U/L 31 29 18   ALT 0 - 44 U/L  61(H) 50(H) 13    DIAGNOSTIC IMAGING:  I have independently reviewed the scans and discussed with the patient. No results found.   ASSESSMENT:  1.  Rectosigmoid sigmoid adenocarcinoma: - Presentation with the bleeding per rectum for the last 5 to 6 months and constipation. - Colonoscopy on 02/18/2021 showed ulcerated partially obstructing mass found from 12-17 cm from the anal verge, circumferential measuring 5 cm in length. - Biopsy consistent with adenocarcinoma. - CT CAP on 03/06/2021 with circumferential wall thickening, hyperenhancement and fat stranding about the rectosigmoid junction, mass measuring approximately 6 cm in length.  No evidence of lymphadenopathy or metastatic disease. - CEA on 02/19/2021 was 1.8. - MRI of the pelvis on 04/06/2021 shows high rectal T3c/T4AN1 malignancy. - Chemoradiation therapy with Xeloda from 04/22/2021 through 06/01/2021. - CT CAP on 06/29/2021 showed improvement in the rectosigmoid thickening at the site of malignancy.  No clear adenopathy.  No evidence of metastatic disease. - Robotic assisted LAR on 07/10/2021 with pathology showing 2 cm grade 2, margins negative, 1/18 lymph nodes positive, no tumor deposits, positive treatment effect, no perineural invasion, no lymphovascular invasion, [id number], MMR preserved - She will need completion colonoscopy after adjuvant therapy.   2.  Social/family history: - She worked as a Secretary/administrator and recently stopped working.  She quit smoking in October 2021, smoked 1 pack/day for 40 years. - Maternal aunt had lung cancer and was a non-smoker.  Paternal aunt had cancer, type not known to the patient.   PLAN:  1.  T3c/T4N1 rectosigmoid  adenocarcinoma: - She felt more side effects after cycle 2 of FOLFOX.  He started noticing hair is thinning this week. - She also felt heartburn and gas.  We asked her to take Prilosec and Maalox which is helping. - She has slight mucus in the stool since surgery which is stable. - She  had dry heaves x3 last week but denied any vomiting.  She also had some headaches last week.  She had some fatigue in the first week which improved.  Soreness in the mouth also improved with biotin. - Reviewed labs today which showed normal LFTs.  White count and platelet count is adequate to proceed with cycle 3 today without any dose modifications. - RTC 2 weeks for follow-up with labs and treatment.     2.  Rectal pain: - She is no longer requiring tramadol.   3.  Peripheral neuropathy: - Mild numbness in the legs after COVID-vaccine is stable.  No worsening noted.   Orders placed this encounter:  No orders of the defined types were placed in this encounter.    Derek Jack, MD [place] [phone]   I, Thana Ates, am acting as a scribe for Dr. Derek Jack.  I, Derek Jack MD, have reviewed the above documentation for accuracy and completeness, and I agree with the above.

## 2021-09-15 ENCOUNTER — Inpatient Hospital Stay (HOSPITAL_COMMUNITY): Payer: 59

## 2021-09-15 ENCOUNTER — Inpatient Hospital Stay (HOSPITAL_BASED_OUTPATIENT_CLINIC_OR_DEPARTMENT_OTHER): Payer: 59 | Admitting: Hematology

## 2021-09-15 ENCOUNTER — Other Ambulatory Visit: Payer: Self-pay

## 2021-09-15 VITALS — BP 139/87 | HR 95 | Temp 98.3°F | Resp 18 | Wt 129.2 lb

## 2021-09-15 VITALS — BP 157/91 | HR 83 | Temp 98.9°F | Resp 18

## 2021-09-15 DIAGNOSIS — Z5111 Encounter for antineoplastic chemotherapy: Secondary | ICD-10-CM | POA: Diagnosis not present

## 2021-09-15 DIAGNOSIS — Z95828 Presence of other vascular implants and grafts: Secondary | ICD-10-CM

## 2021-09-15 DIAGNOSIS — C20 Malignant neoplasm of rectum: Secondary | ICD-10-CM | POA: Diagnosis not present

## 2021-09-15 DIAGNOSIS — C187 Malignant neoplasm of sigmoid colon: Secondary | ICD-10-CM

## 2021-09-15 LAB — CBC WITH DIFFERENTIAL/PLATELET
Band Neutrophils: 3 %
Basophils Absolute: 0 10*3/uL (ref 0.0–0.1)
Basophils Relative: 0 %
Eosinophils Absolute: 0.1 10*3/uL (ref 0.0–0.5)
Eosinophils Relative: 1 %
HCT: 32.3 % — ABNORMAL LOW (ref 36.0–46.0)
Hemoglobin: 10.7 g/dL — ABNORMAL LOW (ref 12.0–15.0)
Lymphocytes Relative: 30 %
Lymphs Abs: 3 10*3/uL (ref 0.7–4.0)
MCH: 33.3 pg (ref 26.0–34.0)
MCHC: 33.1 g/dL (ref 30.0–36.0)
MCV: 100.6 fL — ABNORMAL HIGH (ref 80.0–100.0)
Metamyelocytes Relative: 4 %
Monocytes Absolute: 0.9 10*3/uL (ref 0.1–1.0)
Monocytes Relative: 9 %
Myelocytes: 2 %
Neutro Abs: 5.3 10*3/uL (ref 1.7–7.7)
Neutrophils Relative %: 51 %
Platelets: 150 10*3/uL (ref 150–400)
RBC: 3.21 MIL/uL — ABNORMAL LOW (ref 3.87–5.11)
RDW: 13.7 % (ref 11.5–15.5)
WBC: 9.9 10*3/uL (ref 4.0–10.5)
nRBC: 0.3 % — ABNORMAL HIGH (ref 0.0–0.2)

## 2021-09-15 LAB — COMPREHENSIVE METABOLIC PANEL
ALT: 61 U/L — ABNORMAL HIGH (ref 0–44)
AST: 31 U/L (ref 15–41)
Albumin: 3.3 g/dL — ABNORMAL LOW (ref 3.5–5.0)
Alkaline Phosphatase: 93 U/L (ref 38–126)
Anion gap: 6 (ref 5–15)
BUN: 11 mg/dL (ref 6–20)
CO2: 27 mmol/L (ref 22–32)
Calcium: 8.4 mg/dL — ABNORMAL LOW (ref 8.9–10.3)
Chloride: 105 mmol/L (ref 98–111)
Creatinine, Ser: 0.77 mg/dL (ref 0.44–1.00)
GFR, Estimated: >60 mL/min (ref >60)
Glucose, Bld: 125 mg/dL — ABNORMAL HIGH (ref 70–99)
Potassium: 3.3 mmol/L — ABNORMAL LOW (ref 3.5–5.1)
Sodium: 138 mmol/L (ref 135–145)
Total Bilirubin: <0.1 mg/dL — ABNORMAL LOW (ref 0.3–1.2)
Total Protein: 6.3 g/dL — ABNORMAL LOW (ref 6.5–8.1)

## 2021-09-15 LAB — MAGNESIUM: Magnesium: 1.7 mg/dL (ref 1.7–2.4)

## 2021-09-15 MED ORDER — SODIUM CHLORIDE 0.9 % IV SOLN
10.0000 mg | Freq: Once | INTRAVENOUS | Status: AC
  Administered 2021-09-15: 10 mg via INTRAVENOUS
  Filled 2021-09-15: qty 10

## 2021-09-15 MED ORDER — DEXTROSE 5 % IV SOLN: Freq: Once | INTRAVENOUS | Status: AC

## 2021-09-15 MED ORDER — PALONOSETRON HCL INJECTION 0.25 MG/5ML
0.2500 mg | Freq: Once | INTRAVENOUS | Status: AC
  Administered 2021-09-15: 0.25 mg via INTRAVENOUS
  Filled 2021-09-15: qty 5

## 2021-09-15 MED ORDER — OXALIPLATIN CHEMO INJECTION 100 MG/20ML
85.0000 mg/m2 | Freq: Once | INTRAVENOUS | Status: AC
  Administered 2021-09-15: 135 mg via INTRAVENOUS
  Filled 2021-09-15: qty 10

## 2021-09-15 MED ORDER — LEUCOVORIN CALCIUM INJECTION 350 MG
400.0000 mg/m2 | Freq: Once | INTRAVENOUS | Status: AC
  Administered 2021-09-15: 636 mg via INTRAVENOUS
  Filled 2021-09-15: qty 31.8

## 2021-09-15 MED ORDER — FLUOROURACIL CHEMO INJECTION 2.5 GM/50ML
400.0000 mg/m2 | Freq: Once | INTRAVENOUS | Status: AC
  Administered 2021-09-15: 650 mg via INTRAVENOUS
  Filled 2021-09-15: qty 13

## 2021-09-15 MED ORDER — SODIUM CHLORIDE 0.9 % IV SOLN
2400.0000 mg/m2 | INTRAVENOUS | Status: DC
  Administered 2021-09-15: 3800 mg via INTRAVENOUS
  Filled 2021-09-15: qty 76

## 2021-09-15 NOTE — Progress Notes (Signed)
Patients port flushed without difficulty.  Good blood return noted with no bruising or swelling noted at site.  Stable during access and blood draw.  Patient to remain accessed for treatment. 

## 2021-09-15 NOTE — Patient Instructions (Signed)
[facility]  Discharge Instructions: Thank you for choosing [place] to provide your oncology and hematology care.  If you have a lab appointment with the [facility], please come in thru the Main Entrance and check in at the main information desk.  Wear comfortable clothing and clothing appropriate for easy access to any Portacath or PICC line.   We strive to give you quality time with your provider. You may need to reschedule your appointment if you arrive late (15 or more minutes).  Arriving late affects you and other patients whose appointments are after yours.  Also, if you miss three or more appointments without notifying the office, you may be dismissed from the clinic at the provider's discretion.      For prescription refill requests, have your pharmacy contact our office and allow 72 hours for refills to be completed.    Today you received the following chemotherapy and/or immunotherapy agents Folfox.       To help prevent nausea and vomiting after your treatment, we encourage you to take your nausea medication as directed.  BELOW ARE SYMPTOMS THAT SHOULD BE REPORTED IMMEDIATELY: *FEVER GREATER THAN 100.4 F (38 C) OR HIGHER *CHILLS OR SWEATING *NAUSEA AND VOMITING THAT IS NOT CONTROLLED WITH YOUR NAUSEA MEDICATION *UNUSUAL SHORTNESS OF BREATH *UNUSUAL BRUISING OR BLEEDING *URINARY PROBLEMS (pain or burning when urinating, or frequent urination) *BOWEL PROBLEMS (unusual diarrhea, constipation, pain near the anus) TENDERNESS IN MOUTH AND THROAT WITH OR WITHOUT PRESENCE OF ULCERS (sore throat, sores in mouth, or a toothache) UNUSUAL RASH, SWELLING OR PAIN  UNUSUAL VAGINAL DISCHARGE OR ITCHING   Items with * indicate a potential emergency and should be followed up as soon as possible or go to the Emergency Department if any problems should occur.  Please show the CHEMOTHERAPY ALERT CARD or IMMUNOTHERAPY ALERT CARD at check-in to the Emergency  Department and triage nurse.  Should you have questions after your visit or need to cancel or reschedule your appointment, please contact [facility] [phone]  and follow the prompts.  Office hours are 8:00 a.m. to 4:30 p.m. Monday - Friday. Please note that voicemails left after 4:00 p.m. may not be returned until the following business day.  We are closed weekends and major holidays. You have access to a nurse at all times for urgent questions. Please call the main number to the clinic [phone] and follow the prompts.  For any non-urgent questions, you may also contact your provider using MyChart. We now offer e-Visits for anyone 6 and older to request care online for non-urgent symptoms. For details visit [email or web address].   Also download the MyChart app! Go to the app store, search "MyChart", open the app, select [facility], and log in with your MyChart username and password.  Due to Covid, a mask is required upon entering the hospital/clinic. If you do not have a mask, one will be given to you upon arrival. For doctor visits, patients may have 1 support person aged 37 or older with them. For treatment visits, patients cannot have anyone with them due to current Covid guidelines and our immunocompromised population.

## 2021-09-15 NOTE — Progress Notes (Signed)
Patient presents today for treatment and follow up visit with Dr. Delton Coombes. Labs reviewed by MD. Message received from A [place] Rn/ Dr. Delton Coombes to proceed with treatment. Potassium 3.3 today.   Reported elevated blood pressure at discharge to Dr. Delton Coombes. Message received may discharge patient home at this time.   Treatment given today per MD orders. Tolerated infusion without adverse affects. Vital signs stable. 5FU pump infusing and RUN noted on screen. Verified with patient. No complaints at this time. Discharged from clinic ambulatory in stable condition. Alert and oriented x 3. F/U with [facility] as scheduled.

## 2021-09-15 NOTE — Patient Instructions (Signed)
[place] at [facility] Discharge Instructions  You were seen and examined today by Dr. Delton Coombes. We will proceed with treatment today. Return as scheduled for pump removal and injection. Return as scheduled for next office visit and treatment.    Thank you for choosing [place] at [facility] to provide your oncology and hematology care.  To afford each patient quality time with our provider, please arrive at least 15 minutes before your scheduled appointment time.   If you have a lab appointment with the [facility] please come in thru the Main Entrance and check in at the main information desk.  You need to re-schedule your appointment should you arrive 10 or more minutes late.  We strive to give you quality time with our providers, and arriving late affects you and other patients whose appointments are after yours.  Also, if you no show three or more times for appointments you may be dismissed from the clinic at the providers discretion.     Again, thank you for choosing [facility].  Our hope is that these requests will decrease the amount of time that you wait before being seen by our physicians.       _____________________________________________________________  Should you have questions after your visit to [facility], please contact our office at [phone] and follow the prompts.  Our office hours are 8:00 a.m. and 4:30 p.m. Monday - Friday.  Please note that voicemails left after 4:00 p.m. may not be returned until the following business day.  We are closed weekends and major holidays.  You do have access to a nurse 24-7, just call the main number to the clinic [phone] and do not press any options, hold on the line and a nurse will answer the phone.    For prescription refill requests, have your pharmacy contact our office and allow 72 hours.    Due to Covid, you will need to wear a mask upon  entering the hospital. If you do not have a mask, a mask will be given to you at the Main Entrance upon arrival. For doctor visits, patients may have 1 support person age 24 or older with them. For treatment visits, patients can not have anyone with them due to social distancing guidelines and our immunocompromised population.

## 2021-09-15 NOTE — Progress Notes (Signed)
Patient has been examined, vital signs and labs have been reviewed by Dr. Katragadda. ANC, Creatinine, LFTs, hemoglobin, and platelets are within treatment parameters per Dr. Katragadda. Patient may proceed with treatment per M.D.   

## 2021-09-16 ENCOUNTER — Encounter (HOSPITAL_COMMUNITY): Payer: Self-pay | Admitting: Hematology

## 2021-09-17 ENCOUNTER — Other Ambulatory Visit: Payer: Self-pay

## 2021-09-17 ENCOUNTER — Inpatient Hospital Stay (HOSPITAL_COMMUNITY): Payer: 59

## 2021-09-17 VITALS — BP 143/91 | HR 95 | Temp 97.3°F | Resp 18

## 2021-09-17 DIAGNOSIS — Z5111 Encounter for antineoplastic chemotherapy: Secondary | ICD-10-CM | POA: Diagnosis not present

## 2021-09-17 DIAGNOSIS — Z95828 Presence of other vascular implants and grafts: Secondary | ICD-10-CM

## 2021-09-17 DIAGNOSIS — C20 Malignant neoplasm of rectum: Secondary | ICD-10-CM

## 2021-09-17 DIAGNOSIS — B89 Unspecified parasitic disease: Secondary | ICD-10-CM

## 2021-09-17 MED ORDER — HEPARIN SOD (PORK) LOCK FLUSH 100 UNIT/ML IV SOLN
500.0000 [IU] | Freq: Once | INTRAVENOUS | Status: AC | PRN
  Administered 2021-09-17: 500 [IU]

## 2021-09-17 MED ORDER — SODIUM CHLORIDE 0.9% FLUSH
10.0000 mL | INTRAVENOUS | Status: DC | PRN
  Administered 2021-09-17: 10 mL

## 2021-09-17 MED ORDER — PEGFILGRASTIM-CBQV 6 MG/0.6ML ~~LOC~~ SOSY
6.0000 mg | PREFILLED_SYRINGE | Freq: Once | SUBCUTANEOUS | Status: AC
  Administered 2021-09-17: 6 mg via SUBCUTANEOUS
  Filled 2021-09-17: qty 0.6

## 2021-09-17 NOTE — Patient Instructions (Signed)
[facility]  Discharge Instructions: Thank you for choosing [facility] to provide your oncology and hematology care.  If you have a lab appointment with the Cancer Center, please come in thru the Main Entrance and check in at the main information desk.  Wear comfortable clothing and clothing appropriate for easy access to any Portacath or PICC line.   We strive to give you quality time with your provider. You may need to reschedule your appointment if you arrive late (15 or more minutes).  Arriving late affects you and other patients whose appointments are after yours.  Also, if you miss three or more appointments without notifying the office, you may be dismissed from the clinic at the provider's discretion.      For prescription refill requests, have your pharmacy contact our office and allow 72 hours for refills to be completed.        To help prevent nausea and vomiting after your treatment, we encourage you to take your nausea medication as directed.  BELOW ARE SYMPTOMS THAT SHOULD BE REPORTED IMMEDIATELY: *FEVER GREATER THAN 100.4 F (38 C) OR HIGHER *CHILLS OR SWEATING *NAUSEA AND VOMITING THAT IS NOT CONTROLLED WITH YOUR NAUSEA MEDICATION *UNUSUAL SHORTNESS OF BREATH *UNUSUAL BRUISING OR BLEEDING *URINARY PROBLEMS (pain or burning when urinating, or frequent urination) *BOWEL PROBLEMS (unusual diarrhea, constipation, pain near the anus) TENDERNESS IN MOUTH AND THROAT WITH OR WITHOUT PRESENCE OF ULCERS (sore throat, sores in mouth, or a toothache) UNUSUAL RASH, SWELLING OR PAIN  UNUSUAL VAGINAL DISCHARGE OR ITCHING   Items with * indicate a potential emergency and should be followed up as soon as possible or go to the Emergency Department if any problems should occur.  Please show the CHEMOTHERAPY ALERT CARD or IMMUNOTHERAPY ALERT CARD at check-in to the Emergency Department and triage nurse.  Should you have questions after your visit or need to cancel  or reschedule your appointment, please contact [facility] [phone]  and follow the prompts.  Office hours are 8:00 a.m. to 4:30 p.m. Monday - Friday. Please note that voicemails left after 4:00 p.m. may not be returned until the following business day.  We are closed weekends and major holidays. You have access to a nurse at all times for urgent questions. Please call the main number to the clinic [phone] and follow the prompts.  For any non-urgent questions, you may also contact your provider using MyChart. We now offer e-Visits for anyone 18 and older to request care online for non-urgent symptoms. For details visit [email or web address].   Also download the MyChart app! Go to the app store, search "MyChart", open the app, select [facility], and log in with your MyChart username and password.  Due to Covid, a mask is required upon entering the hospital/clinic. If you do not have a mask, one will be given to you upon arrival. For doctor visits, patients may have 1 support person aged 18 or older with them. For treatment visits, patients cannot have anyone with them due to current Covid guidelines and our immunocompromised population.  

## 2021-09-17 NOTE — Progress Notes (Signed)
Deshunda Case presents to have home infusion pump d/c'd and for port-a-cath deaccess with flush.  Portacath located R chest wall accessed with  H 20 needle.  Good blood return present. Portacath flushed with NS and 500U/63ml Heparin, and needle removed intact.  Procedure tolerated well and without incident.  Patient brought in a specimen from her stool for Dr. Delton Coombes to evaluate.  The specimen was sent to the lab for parasitic evaluation. Patient left ambulatory in stable condition.

## 2021-09-22 ENCOUNTER — Telehealth (HOSPITAL_COMMUNITY): Payer: Self-pay | Admitting: *Deleted

## 2021-09-22 ENCOUNTER — Other Ambulatory Visit (HOSPITAL_COMMUNITY): Payer: Self-pay | Admitting: *Deleted

## 2021-09-22 NOTE — Telephone Encounter (Signed)
Advised patient regarding the use of Blistex and to make [place] aware if not resolved over the next few days.  Verbalized understanding.  Information given to her regarding the result from the worm that she provided to [place], presumably from her GI tract.  No specific information was provided from the lab specimen, however was not identified as a parasite.  She denies seeing any further worms, therefore she deduced that it may have come from the septic tank and not from her.

## 2021-09-22 NOTE — Telephone Encounter (Signed)
Patient called with c/o open sores on top and bottom lips.  Described as very painful and draining clear liquid. It is limiting her po intake.  Denies sores in mouth or throat at this time.

## 2021-09-25 ENCOUNTER — Emergency Department (HOSPITAL_COMMUNITY)
Admission: EM | Admit: 2021-09-25 | Discharge: 2021-09-25 | Disposition: A | Discharge status: Home / Self Care | Payer: 59 | Attending: Emergency Medicine | Admitting: Emergency Medicine

## 2021-09-25 ENCOUNTER — Emergency Department (HOSPITAL_COMMUNITY): Payer: 59

## 2021-09-25 ENCOUNTER — Other Ambulatory Visit: Payer: Self-pay

## 2021-09-25 ENCOUNTER — Encounter (HOSPITAL_COMMUNITY): Payer: Self-pay | Admitting: Emergency Medicine

## 2021-09-25 ENCOUNTER — Encounter (HOSPITAL_COMMUNITY): Payer: Self-pay

## 2021-09-25 DIAGNOSIS — R112 Nausea with vomiting, unspecified: Secondary | ICD-10-CM | POA: Insufficient documentation

## 2021-09-25 DIAGNOSIS — R1030 Lower abdominal pain, unspecified: Secondary | ICD-10-CM | POA: Insufficient documentation

## 2021-09-25 DIAGNOSIS — R000 Tachycardia, unspecified: Secondary | ICD-10-CM | POA: Diagnosis not present

## 2021-09-25 DIAGNOSIS — E860 Dehydration: Secondary | ICD-10-CM | POA: Diagnosis not present

## 2021-09-25 DIAGNOSIS — Z85048 Personal history of other malignant neoplasm of rectum, rectosigmoid junction, and anus: Secondary | ICD-10-CM | POA: Insufficient documentation

## 2021-09-25 DIAGNOSIS — Z87891 Personal history of nicotine dependence: Secondary | ICD-10-CM | POA: Diagnosis not present

## 2021-09-25 DIAGNOSIS — I10 Essential (primary) hypertension: Secondary | ICD-10-CM | POA: Insufficient documentation

## 2021-09-25 DIAGNOSIS — R1114 Bilious vomiting: Secondary | ICD-10-CM

## 2021-09-25 DIAGNOSIS — R531 Weakness: Secondary | ICD-10-CM | POA: Diagnosis present

## 2021-09-25 DIAGNOSIS — Z20822 Contact with and (suspected) exposure to covid-19: Secondary | ICD-10-CM | POA: Insufficient documentation

## 2021-09-25 DIAGNOSIS — Z85038 Personal history of other malignant neoplasm of large intestine: Secondary | ICD-10-CM | POA: Diagnosis not present

## 2021-09-25 DIAGNOSIS — Z79899 Other long term (current) drug therapy: Secondary | ICD-10-CM | POA: Diagnosis not present

## 2021-09-25 LAB — CBC WITH DIFFERENTIAL/PLATELET
Band Neutrophils: 6 %
Basophils Absolute: 0 10*3/uL (ref 0.0–0.1)
Basophils Relative: 0 %
Eosinophils Absolute: 0 10*3/uL (ref 0.0–0.5)
Eosinophils Relative: 0 %
HCT: 28 % — ABNORMAL LOW (ref 36.0–46.0)
Hemoglobin: 9.4 g/dL — ABNORMAL LOW (ref 12.0–15.0)
Lymphocytes Relative: 25 %
Lymphs Abs: 0.7 10*3/uL (ref 0.7–4.0)
MCH: 32.5 pg (ref 26.0–34.0)
MCHC: 33.6 g/dL (ref 30.0–36.0)
MCV: 96.9 fL (ref 80.0–100.0)
Metamyelocytes Relative: 9 %
Monocytes Absolute: 0.6 10*3/uL (ref 0.1–1.0)
Monocytes Relative: 21 %
Myelocytes: 4 %
Neutro Abs: 1.1 10*3/uL — ABNORMAL LOW (ref 1.7–7.7)
Neutrophils Relative %: 33 %
Platelets: 89 10*3/uL — ABNORMAL LOW (ref 150–400)
Promyelocytes Relative: 2 %
RBC: 2.89 MIL/uL — ABNORMAL LOW (ref 3.87–5.11)
RDW: 14.1 % (ref 11.5–15.5)
WBC: 2.9 10*3/uL — ABNORMAL LOW (ref 4.0–10.5)
nRBC: 0 % (ref 0.0–0.2)

## 2021-09-25 LAB — URINALYSIS, ROUTINE W REFLEX MICROSCOPIC
Bilirubin Urine: NEGATIVE
Glucose, UA: NEGATIVE mg/dL
Hgb urine dipstick: NEGATIVE
Ketones, ur: NEGATIVE mg/dL
Leukocytes,Ua: NEGATIVE
Nitrite: NEGATIVE
Protein, ur: 30 mg/dL — AB
Specific Gravity, Urine: 1.017 (ref 1.005–1.030)
pH: 5 (ref 5.0–8.0)

## 2021-09-25 LAB — COMPREHENSIVE METABOLIC PANEL
ALT: 27 U/L (ref 0–44)
AST: 26 U/L (ref 15–41)
Albumin: 2.9 g/dL — ABNORMAL LOW (ref 3.5–5.0)
Alkaline Phosphatase: 103 U/L (ref 38–126)
Anion gap: 12 (ref 5–15)
BUN: 13 mg/dL (ref 6–20)
CO2: 22 mmol/L (ref 22–32)
Calcium: 8.2 mg/dL — ABNORMAL LOW (ref 8.9–10.3)
Chloride: 94 mmol/L — ABNORMAL LOW (ref 98–111)
Creatinine, Ser: 1.09 mg/dL — ABNORMAL HIGH (ref 0.44–1.00)
GFR, Estimated: >60 mL/min (ref >60)
Glucose, Bld: 151 mg/dL — ABNORMAL HIGH (ref 70–99)
Potassium: 3.6 mmol/L (ref 3.5–5.1)
Sodium: 128 mmol/L — ABNORMAL LOW (ref 135–145)
Total Bilirubin: 0.8 mg/dL (ref 0.3–1.2)
Total Protein: 7 g/dL (ref 6.5–8.1)

## 2021-09-25 LAB — RESP PANEL BY RT-PCR (FLU A&B, COVID) ARPGX2
Influenza A by PCR: NEGATIVE
Influenza B by PCR: NEGATIVE
SARS Coronavirus 2 by RT PCR: NEGATIVE

## 2021-09-25 LAB — LIPASE, BLOOD: Lipase: 31 U/L (ref 11–51)

## 2021-09-25 MED ORDER — IOHEXOL 9 MG/ML PO SOLN
ORAL | Status: AC
  Filled 2021-09-25: qty 500

## 2021-09-25 MED ORDER — SODIUM CHLORIDE 0.9 % IV BOLUS
1000.0000 mL | Freq: Once | INTRAVENOUS | Status: AC
  Administered 2021-09-25: 1000 mL via INTRAVENOUS

## 2021-09-25 MED ORDER — SUCRALFATE 1 G PO TABS: 1.0000 g | ORAL_TABLET | Freq: Three times a day (TID) | ORAL | 0 refills | Status: DC

## 2021-09-25 MED ORDER — FAMOTIDINE 20 MG PO TABS: 20.0000 mg | ORAL_TABLET | Freq: Two times a day (BID) | ORAL | 0 refills | Status: DC

## 2021-09-25 MED ORDER — ONDANSETRON HCL 4 MG/2ML IJ SOLN
4.0000 mg | Freq: Once | INTRAMUSCULAR | Status: AC
  Administered 2021-09-25: 4 mg via INTRAVENOUS
  Filled 2021-09-25: qty 2

## 2021-09-25 MED ORDER — FENTANYL CITRATE PF 50 MCG/ML IJ SOSY
12.5000 ug | PREFILLED_SYRINGE | Freq: Once | INTRAMUSCULAR | Status: AC
  Administered 2021-09-25: 12.5 ug via INTRAVENOUS
  Filled 2021-09-25: qty 1

## 2021-09-25 MED ORDER — PROMETHAZINE HCL 25 MG PO TABS: 25.0000 mg | ORAL_TABLET | Freq: Four times a day (QID) | ORAL | 0 refills | Status: DC | PRN

## 2021-09-25 MED ORDER — SODIUM CHLORIDE 0.9 % IV SOLN: INTRAVENOUS | Status: DC

## 2021-09-25 MED ORDER — IOHEXOL 300 MG/ML  SOLN
75.0000 mL | Freq: Once | INTRAMUSCULAR | Status: AC | PRN
  Administered 2021-09-25: 75 mL via INTRAVENOUS

## 2021-09-25 NOTE — Discharge Instructions (Signed)
As discussed, your evaluation today has been largely reassuring.  But, it is important that you monitor your condition carefully, and do not hesitate to return to the ED if you develop new, or concerning changes in your condition. ? ?Otherwise, please follow-up with your physician for appropriate ongoing care. ? ?

## 2021-09-25 NOTE — Progress Notes (Signed)
Patient's husband called reporting black emesis that began this morning. Patient directed to ED. ED Charge RN made aware and awaiting patient's arrival.

## 2021-09-25 NOTE — ED Triage Notes (Signed)
Pt to ER from home with c/o n/v and weakness since last chemo treatment on 10/28.  Pt states she has been weaker than normal.Dr. Vanita Panda to bedside for assessment.

## 2021-09-25 NOTE — ED Provider Notes (Signed)
[facility] EMERGENCY DEPARTMENT Provider Note   CSN: [id number] Arrival date & time: 09/25/21  1023     History Chief Complaint  Patient presents with   Emesis   Weakness    Alexandra Bright Case is a 55 y.o. female.  HPI  Patient presents with her husband who assists with the history.  She has a history of rectal cancer, currently receiving chemotherapy, last 8 days ago.  She notes that since that time she has had persistent nausea, minimal p.o. intake.  Abdominal pain bilateral lower abdomen, seemingly worse with vomiting.  Pain is sore, 3/10.  No change in baseline diarrhea that began after she initiated chemotherapy earlier this summer. Prior to diagnosis, and surgery soon thereafter she was generally well.  She has no ostomy, had initial tumor removal with anastomosis in August of this year. Now over the past 8 days with persistent nausea, weakness, vomiting she presents for evaluation. No relief with anything, no clear exacerbating factors, however, either.   Past Medical History:  Diagnosis Date   Anxiety    Colon cancer ([place]) 01/2021   Constipation    GERD (gastroesophageal reflux disease)    Hypertension    Port-A-Cath in place 08/17/2021   Pre-diabetes     Patient Active Problem List   Diagnosis Date Noted   Port-A-Cath in place 08/17/2021   Macrocytic anemia 08/17/2021   Elevated liver enzymes 06/17/2021   Diarrhea 06/17/2021   Rectal cancer ([facility]) 04/08/2021   Adenocarcinoma of colon ([place]) 02/26/2021   Eczema 02/26/2021   IBS (irritable bowel syndrome) 02/05/2021   Rectal bleeding 02/05/2021   Hypertension 11/28/2020   Hyperlipidemia 11/27/2020   Chronic constipation 11/27/2020   Anxiety 11/27/2020   Idiopathic peripheral neuropathy 11/27/2020   Vitamin D insufficiency 07/03/2018   Depression 04/25/2018    Past Surgical History:  Procedure Laterality Date   BIOPSY  02/18/2021   Procedure: BIOPSY;  Surgeon: Harvel Quale, MD;  Location: [facility];  Service: Gastroenterology;;   FACIAL FRACTURE SURGERY  1991   from [place]  02/18/2021   Procedure: FLEXIBLE SIGMOIDOSCOPY;  Surgeon: Harvel Quale, MD;  Location: [facility];  Service: Gastroenterology;;   IR IMAGING GUIDED PORT INSERTION  08/13/2021   POLYPECTOMY  02/18/2021   Procedure: POLYPECTOMY INTESTINAL;  Surgeon: Harvel Quale, MD;  Location: [facility];  Service: Gastroenterology;;   SUBMUCOSAL TATTOO INJECTION  02/18/2021   Procedure: SUBMUCOSAL TATTOO INJECTION;  Surgeon: Harvel Quale, MD;  Location: [facility];  Service: Gastroenterology;;   XI ROBOTIC ASSISTED LOWER ANTERIOR RESECTION N/A 07/10/2021   Procedure: XI ROBOTIC ASSISTED LOWER ANTERIOR RESECTION;  Surgeon: Leighton Ruff, MD;  Location: [facility];  Service: General;  Laterality: N/A;     OB History   No obstetric history on file.     Family History  Problem Relation Age of Onset   Diabetes Sister    Cancer Maternal Aunt    Ovarian cancer Cousin    Ovarian cancer Paternal Aunt    Prostate cancer Cousin     Social History   Tobacco Use   Smoking status: Former    Packs/day: 0.50    Years: 40.00    Pack years: 20.00    Types: Cigarettes    Quit date: 2020    Years since quitting: 2.8   Smokeless tobacco: Never  Vaping Use   Vaping Use: Never used  Substance Use Topics   Alcohol use: Not Currently  Drug use: Never    Home Medications Prior to Admission medications   Medication Sig Start Date End Date Taking? Authorizing Provider  benzocaine (ORAJEL) 10 % mucosal gel Use as directed 1 application in the mouth or throat as needed for mouth pain.   Yes [provider]  buPROPion (WELLBUTRIN SR) 200 MG 12 hr tablet Take 1 tablet (200 mg total) by mouth 2 (two) times daily. 11/27/20  Yes Lindell Spar, MD  dicyclomine (BENTYL) 10 MG capsule Take 10 mg by mouth 2 (two) times daily as needed for spasms.   Yes [provider]  diphenhydrAMINE (BENADRYL) 25 MG tablet Take 25 mg by mouth every 6 (six) hours as needed for allergies.   Yes [provider]  famotidine (PEPCID) 20 MG tablet Take 1 tablet (20 mg total) by mouth 2 (two) times daily. 09/25/21  Yes Carmin Muskrat, MD  fluorouracil CALGB 89381 2,400 mg/m2 in sodium chloride 0.9 % 150 mL Inject 2,400 mg/m2 into the vein over 48 hr. 08/18/21  Yes [provider]  FLUOROURACIL IV Inject into the vein every 14 (fourteen) days.   Yes [provider]  LEUCOVORIN CALCIUM IV Inject into the vein every 14 (fourteen) days. 08/18/21  Yes [provider]  lidocaine-prilocaine (EMLA) cream Apply a small amount to port a cath site and cover with plastic wrap 1 hour prior to chemotherapy appointments 08/17/21  Yes Derek Jack, MD  loperamide (IMODIUM) 1 MG/5ML solution Take 2 mg by mouth as needed for diarrhea or loose stools.   Yes [provider]  losartan (COZAAR) 50 MG tablet Take 1 tablet by mouth once daily Patient taking differently: Take 50 mg by mouth daily. 05/26/21  Yes Lindell Spar, MD  Multiple Vitamins-Minerals (MULTIVITAMIN WITH MINERALS) tablet Take 1 tablet by mouth in the morning. Women's Multivitamin   Yes [provider]  OXALIPLATIN IV Inject into the vein every 14 (fourteen) days. 08/18/21  Yes [provider]  prochlorperazine (COMPAZINE) 10 MG tablet Take 1 tablet (10 mg total) by mouth every 6 (six) hours as needed (Nausea or vomiting). 08/17/21  Yes Derek Jack, MD  promethazine (PHENERGAN) 25 MG tablet Take 1 tablet (25 mg total) by mouth every 6 (six) hours as needed for nausea or vomiting. 09/25/21  Yes Carmin Muskrat, MD  sennosides-docusate sodium (SENOKOT-S) 8.6-50 MG tablet Take 1 tablet by mouth daily as needed for constipation.   Yes [provider]  simethicone (MYLICON) 80 MG chewable tablet Chew 80 mg by mouth every 6 (six) hours as needed for  flatulence.   Yes [provider]  sucralfate (CARAFATE) 1 g tablet Take 1 tablet (1 g total) by mouth 4 (four) times daily -  with meals and at bedtime. 09/25/21  Yes Carmin Muskrat, MD  traMADol (ULTRAM) 50 MG tablet Take 1 tablet (50 mg total) by mouth 2 (two) times daily as needed. Patient taking differently: Take 50 mg by mouth 2 (two) times daily as needed for moderate pain. 08/06/21  Yes Derek Jack, MD    Allergies    Patient has no known allergies.  Review of Systems   Review of Systems  Constitutional:        Per HPI, otherwise negative  HENT:         Per HPI, otherwise negative  Respiratory:         Per HPI, otherwise negative  Cardiovascular:        Per HPI, otherwise negative  Gastrointestinal:  Positive for abdominal pain, nausea and vomiting.  Endocrine:       Negative aside from HPI  Genitourinary:        Neg aside from HPI   Musculoskeletal:        Per HPI, otherwise negative  Skin: Negative.   Allergic/Immunologic: Positive for immunocompromised state.  Neurological:  Negative for syncope.   Physical Exam Updated Vital Signs BP 131/69   Pulse 100   Temp 98 F (36.7 C) (Oral)   Resp 18   Ht 5\' 1"  (1.549 m)   Wt 57.2 kg   SpO2 99%   BMI 23.81 kg/m   Physical Exam Vitals and nursing note reviewed.  Constitutional:      General: She is not in acute distress.    Appearance: She is well-developed.  HENT:     Head: Normocephalic and atraumatic.  Eyes:     Conjunctiva/sclera: Conjunctivae normal.  Cardiovascular:     Rate and Rhythm: Regular rhythm. Tachycardia present.  Pulmonary:     Effort: Pulmonary effort is normal. No respiratory distress.     Breath sounds: Normal breath sounds. No stridor.  Abdominal:     General: There is no distension.     Tenderness: There is abdominal tenderness. There is no guarding.     Comments: Nonperitoneal abdominal exam, though with mild tenderness lateral aspects bilaterally.  Skin:     General: Skin is warm and dry.  Neurological:     Mental Status: She is alert and oriented to person, place, and time.     Cranial Nerves: No cranial nerve deficit.    ED Results / Procedures / Treatments   Labs (all labs ordered are listed, but only abnormal results are displayed) Labs Reviewed  COMPREHENSIVE METABOLIC PANEL - Abnormal; Notable for the following components:      Result Value   Sodium 128 (*)    Chloride 94 (*)    Glucose, Bld 151 (*)    Creatinine, Ser 1.09 (*)    Calcium 8.2 (*)    Albumin 2.9 (*)    All other components within normal limits  CBC WITH DIFFERENTIAL/PLATELET - Abnormal; Notable for the following components:   WBC 2.9 (*)    RBC 2.89 (*)    Hemoglobin 9.4 (*)    HCT 28.0 (*)    Platelets 89 (*)    Neutro Abs 1.1 (*)    All other components within normal limits  URINALYSIS, ROUTINE W REFLEX MICROSCOPIC - Abnormal; Notable for the following components:   Color, Urine AMBER (*)    APPearance HAZY (*)    Protein, ur 30 (*)    Bacteria, UA RARE (*)    All other components within normal limits  RESP PANEL BY RT-PCR (FLU A&B, COVID) ARPGX2  LIPASE, BLOOD  POC URINE PREG, ED    EKG None  Radiology CT ABDOMEN PELVIS W CONTRAST  Result Date: 09/25/2021 CLINICAL DATA:  Bowel obstruction suspected EXAM: CT ABDOMEN AND PELVIS WITH CONTRAST TECHNIQUE: Multidetector CT imaging of the abdomen and pelvis was performed using the standard protocol following bolus administration of intravenous contrast. CONTRAST:  1mL OMNIPAQUE IOHEXOL 300 MG/ML  SOLN COMPARISON:  06/29/2021 FINDINGS: Lower chest: No acute abnormality. Hepatobiliary: No focal liver abnormality. Gallbladder is distended without wall thickening or gallstones. No biliary dilatation. Pancreas: Unremarkable. No pancreatic ductal dilatation or surrounding inflammatory changes. Spleen: Normal in size without focal abnormality. Adrenals/Urinary Tract: Stable thickening of the adrenals. Kidneys are  unremarkable. Bladder is decompressed and  not well evaluated. Stomach/Bowel: Stomach is within normal limits. Rectosigmoid postoperative changes. Stool and air mildly distend the right, transverse, and proximal left colon. Sigmoid is more decompressed with no clear obstruction. Vascular/Lymphatic: No significant vascular abnormality. No enlarged lymph nodes. Reproductive: Intrauterine device is present.  No adnexal mass. Other: No free fluid.  Abdominal wall is unremarkable. Musculoskeletal: Lumbar spine degenerative changes. No acute osseous abnormality. IMPRESSION: Stool and air mildly distend the right, transverse, and proximal left colon. The sigmoid colon is more decompressed without apparent obstruction. Electronically Signed   By: Macy Mis M.D.   On: 09/25/2021 12:48    Procedures Procedures   Medications Ordered in ED Medications  0.9 %  sodium chloride infusion ( Intravenous New Bag/Given 09/25/21 1104)  fentaNYL (SUBLIMAZE) injection 12.5 mcg (12.5 mcg Intravenous Given 09/25/21 1105)  ondansetron (ZOFRAN) injection 4 mg (4 mg Intravenous Given 09/25/21 1104)  iohexol (OMNIPAQUE) 9 MG/ML oral solution (  Contrast Given 09/25/21 1217)  iohexol (OMNIPAQUE) 300 MG/ML solution 75 mL (75 mLs Intravenous Contrast Given 09/25/21 1224)  sodium chloride 0.9 % bolus 1,000 mL (0 mLs Intravenous Stopped 09/25/21 1607)    ED Course  I have reviewed the triage vital signs and the nursing notes.  Pertinent labs & imaging results that were available during my care of the patient were reviewed by me and considered in my medical decision making (see chart for details). Patient with hypotension on arrival,95/75, tachycardia, both concerning for dehydration.  With abdominal pain, the context of ongoing cancer, patient had labs, CT ordered, received fluid resuscitation.   Update: Patient better 4:12 PM Final labs available, reviewed, notable for mild dehydration, otherwise generally reassuring, no  substantial anion gap, no urinary tract infection, no evidence of bacteremia, sepsis.  She is improved substantially here blood pressure is improved, with fluid resuscitation, antiemetics. We will length conversation about all findings, including CT scan which I reviewed.  Absent acute evidence for new obstruction or infection, disease.,  Patient discharged in stable condition to follow-up with her oncologist. MDM Rules/Calculators/A&P MDM Number of Diagnoses or Management Options Bilious vomiting with nausea: new, needed workup Dehydration: new, needed workup   Amount and/or Complexity of Data Reviewed Clinical lab tests: ordered and reviewed Tests in the radiology section of CPT: ordered and reviewed Tests in the medicine section of CPT: ordered and reviewed Decide to obtain previous medical records or to obtain history from someone other than the patient: yes Obtain history from someone other than the patient: yes Review and summarize past medical records: yes Independent visualization of images, tracings, or specimens: yes  Risk of Complications, Morbidity, and/or Mortality Presenting problems: high Diagnostic procedures: high Management options: high  Critical Care Total time providing critical care: < 30 minutes  Patient Progress Patient progress: improved   Final Clinical Impression(s) / ED Diagnoses Final diagnoses:  Dehydration  Bilious vomiting with nausea    Rx / DC Orders ED Discharge Orders          Ordered    famotidine (PEPCID) 20 MG tablet  2 times daily        09/25/21 1611    promethazine (PHENERGAN) 25 MG tablet  Every 6 hours PRN        09/25/21 1611    sucralfate (CARAFATE) 1 g tablet  3 times daily with meals & bedtime       Note to Pharmacy: Take for one week   09/25/21 1611  Carmin Muskrat, MD 09/25/21 [phone]

## 2021-09-28 ENCOUNTER — Encounter (HOSPITAL_COMMUNITY): Payer: Self-pay | Admitting: *Deleted

## 2021-09-28 LAB — POC URINE PREG, ED: Preg Test, Ur: NEGATIVE

## 2021-09-28 NOTE — Progress Notes (Signed)
Patient was in the ER on Friday with nausea and vomiting following treatment.  Was given fluid replacement and discharged home to follow up in office.  She has an appointment with Dr Delton Coombes 11/8 and he advised that appointment for tomorrow will be fine.

## 2021-09-28 NOTE — Progress Notes (Signed)
[place], [place] [address]   CLINIC:  Medical Oncology/Hematology  PCP:  Lindell Spar, MD [address] / [place] [address] [phone]   REASON FOR VISIT:  Follow-up for sigmoid adenocarcinoma  PRIOR THERAPY: none  NGS Results: not done  CURRENT THERAPY: FOLFOX every 2 weeks x 4 months  BRIEF ONCOLOGIC HISTORY:  Oncology History  Rectal cancer ([place])  04/08/2021 Initial Diagnosis   Rectal cancer ([place])   04/20/2021 - 04/20/2021 Chemotherapy          08/18/2021 -  Chemotherapy   Patient is on Treatment Plan : COLORECTAL FOLFOX q14d x 4 months       CANCER STAGING: Cancer Staging Rectal cancer ([place]) Staging form: Colon and Rectum, AJCC 8th Edition - Clinical stage from 04/08/2021: Stage IIIB (cT3, cN1b, cM0) - Unsigned   INTERVAL HISTORY:  Alexandra Bright, a 55 y.o. female, returns for routine follow-up and consideration for next cycle of chemotherapy. [name] was last seen on 09/15/2021.  Due for cycle #4 of FOLFOX today.   Overall, Alexandra Bright tells me Alexandra Bright has been feeling pretty well. Alexandra Bright reports blisters on her lip starting 2 days after treatment. Alexandra Bright presented to the ED on 11/04 for black vomit; Alexandra Bright denies history of peptic ulcers and severe indigestion, and Alexandra Bright has never previously had black vomit. Alexandra Bright reports her nausea has worsened since leaving the hospital; compazine helps but causes fatigue. Alexandra Bright denies any vomiting since being discharged from the hospital, but Alexandra Bright does reports gagging. Alexandra Bright reports diarrhea occurring 12-13 times daily over the past 2 weeks; Imodium did not help until 3 days ago. Alexandra Bright denies abdominal pain.   Overall, Alexandra Bright does not feel ready for next cycle of chemo today.   REVIEW OF SYSTEMS:  Review of Systems  Constitutional:  Positive for appetite change (25%) and fatigue (depleted).  HENT:   Positive for mouth sores.   Gastrointestinal:  Positive for diarrhea, nausea and vomiting (black). Negative  for abdominal pain.  Neurological:  Positive for dizziness and headaches.  All other systems reviewed and are negative.  PAST MEDICAL/SURGICAL HISTORY:  Past Medical History:  Diagnosis Date   Anxiety    Colon cancer ([place]) 01/2021   Constipation    GERD (gastroesophageal reflux disease)    Hypertension    Port-A-Cath in place 08/17/2021   Pre-diabetes    Past Surgical History:  Procedure Laterality Date   BIOPSY  02/18/2021   Procedure: BIOPSY;  Surgeon: Harvel Quale, MD;  [facility];  Service: Gastroenterology;;   FACIAL FRACTURE SURGERY  1991   from [place]  02/18/2021   Procedure: FLEXIBLE SIGMOIDOSCOPY;  Surgeon: Harvel Quale, MD;  Location: [facility];  Service: Gastroenterology;;   IR IMAGING GUIDED PORT INSERTION  08/13/2021   POLYPECTOMY  02/18/2021   Procedure: POLYPECTOMY INTESTINAL;  Surgeon: Harvel Quale, MD;  [facility];  Service: Gastroenterology;;   SUBMUCOSAL TATTOO INJECTION  02/18/2021   Procedure: SUBMUCOSAL TATTOO INJECTION;  Surgeon: Harvel Quale, MD;  Location[facility];  Service: Gastroenterology;;   XI ROBOTIC ASSISTED LOWER ANTERIOR RESECTION N/A 07/10/2021   Procedure: XI ROBOTIC ASSISTED LOWER ANTERIOR RESECTION;  Surgeon: Leighton Ruff, MD;  Location: [facility];  Service: General;  Laterality: N/A;    SOCIAL HISTORY:  Social History   Socioeconomic History   Marital status: Married    Spouse name: Not on file   Number  of children: Not on file   Years of education: Not on file   Highest education level: Not on file  Occupational History   Not on file  Tobacco Use   Smoking status: Former    Packs/day: 0.50    Years: 40.00    Pack years: 20.00    Types: Cigarettes    Quit date: 2020    Years since quitting: 2.8   Smokeless tobacco: Never  Vaping Use   Vaping Use: Never used  Substance and Sexual Activity   Alcohol use: Not Currently    Drug use: Never   Sexual activity: Not Currently  Other Topics Concern   Not on file  Social History Narrative   Not on file   Social Determinants of Health   Financial Resource Strain: Low Risk    Difficulty of Paying Living Expenses: Not hard at all  Food Insecurity: No Food Insecurity   Worried About Charity fundraiser in the Last Year: Never true   [place] in the Last Year: Never true  Transportation Needs: No Transportation Needs   Lack of Transportation (Medical): No   Lack of Transportation (Non-Medical): No  Physical Activity: Insufficiently Active   Days of Exercise per Week: 3 days   Minutes of Exercise per Session: 20 min  Stress: No Stress Concern Present   Feeling of Stress : Only a little  Social Connections: Engineer, building services of Communication with Friends and Family: More than three times a week   Frequency of Social Gatherings with Friends and Family: More than three times a week   Attends Religious Services: More than 4 times per year   Active Member of [employer] or Organizations: Yes   Attends Music therapist: More than 4 times per year   Marital Status: Married  Human resources officer Violence: Not At Risk   Fear of Current or Ex-Partner: No   Emotionally Abused: No   Physically Abused: No   Sexually Abused: No    FAMILY HISTORY:  Family History  Problem Relation Age of Onset   Diabetes Sister    Cancer Maternal Aunt    Ovarian cancer Cousin    Ovarian cancer Paternal Aunt    Prostate cancer Cousin     CURRENT MEDICATIONS:  Current Outpatient Medications  Medication Sig Dispense Refill   benzocaine (ORAJEL) 10 % mucosal gel Use as directed 1 application in the mouth or throat as needed for mouth pain.     buPROPion (WELLBUTRIN SR) 200 MG 12 hr tablet Take 1 tablet (200 mg total) by mouth 2 (two) times daily. 60 tablet 5   dicyclomine (BENTYL) 10 MG capsule Take 10 mg by mouth 2 (two) times daily as needed for spasms.      diphenhydrAMINE (BENADRYL) 25 MG tablet Take 25 mg by mouth every 6 (six) hours as needed for allergies.     fluorouracil CALGB 26948 2,400 mg/m2 in sodium chloride 0.9 % 150 mL Inject 2,400 mg/m2 into the vein over 48 hr.     FLUOROURACIL IV Inject into the vein every 14 (fourteen) days.     LEUCOVORIN CALCIUM IV Inject into the vein every 14 (fourteen) days.     lidocaine-prilocaine (EMLA) cream Apply a small amount to port a cath site and cover with plastic wrap 1 hour prior to chemotherapy appointments 30 g 3   loperamide (IMODIUM) 1 MG/5ML solution Take 2 mg by mouth as needed for diarrhea or loose stools.  losartan (COZAAR) 50 MG tablet Take 1 tablet by mouth once daily (Patient taking differently: Take 50 mg by mouth daily.) 90 tablet 0   Multiple Vitamins-Minerals (MULTIVITAMIN WITH MINERALS) tablet Take 1 tablet by mouth in the morning. Women's Multivitamin     OXALIPLATIN IV Inject into the vein every 14 (fourteen) days.     prochlorperazine (COMPAZINE) 10 MG tablet Take 1 tablet (10 mg total) by mouth every 6 (six) hours as needed (Nausea or vomiting). 30 tablet 1   sennosides-docusate sodium (SENOKOT-S) 8.6-50 MG tablet Take 1 tablet by mouth daily as needed for constipation.     simethicone (MYLICON) 80 MG chewable tablet Chew 80 mg by mouth every 6 (six) hours as needed for flatulence.     traMADol (ULTRAM) 50 MG tablet Take 1 tablet (50 mg total) by mouth 2 (two) times daily as needed. (Patient taking differently: Take 50 mg by mouth 2 (two) times daily as needed for moderate pain.) 60 tablet 0   famotidine (PEPCID) 20 MG tablet Take 1 tablet (20 mg total) by mouth 2 (two) times daily. (Patient not taking: Reported on 09/29/2021) 30 tablet 0   promethazine (PHENERGAN) 25 MG tablet Take 1 tablet (25 mg total) by mouth every 6 (six) hours as needed for nausea or vomiting. (Patient not taking: Reported on 09/29/2021) 30 tablet 0   sucralfate (CARAFATE) 1 g tablet Take 1 tablet (1 g  total) by mouth 4 (four) times daily -  with meals and at bedtime. (Patient not taking: Reported on 09/29/2021) 21 tablet 0   No current facility-administered medications for this visit.   Facility-Administered Medications Ordered in Other Visits  Medication Dose Route Frequency Provider Last Rate Last Admin   sodium chloride flush (NS) 0.9 % injection 10 mL  10 mL Intracatheter PRN Derek Jack, MD   10 mL at 09/03/21 1337    ALLERGIES:  No Known Allergies  PHYSICAL EXAM:  Performance status (ECOG): 1 - Symptomatic but completely ambulatory  There were no vitals filed for this visit. Wt Readings from Last 3 Encounters:  09/29/21 124 lb 6.4 oz (56.4 kg)  09/25/21 126 lb (57.2 kg)  09/15/21 129 lb 3.2 oz (58.6 kg)   Physical Exam Vitals reviewed.  Constitutional:      Appearance: Normal appearance.  HENT:     Mouth/Throat:     Lips: Lesions (blisters on lower lips that are healing) present.  Cardiovascular:     Rate and Rhythm: Normal rate and regular rhythm.     Pulses: Normal pulses.     Heart sounds: Normal heart sounds.  Pulmonary:     Effort: Pulmonary effort is normal.     Breath sounds: Normal breath sounds.  Abdominal:     Palpations: Abdomen is soft.     Tenderness: There is no abdominal tenderness.  Neurological:     General: No focal deficit present.     Mental Status: Alexandra Bright is alert and oriented to person, place, and time.  Psychiatric:        Mood and Affect: Mood normal.        Behavior: Behavior normal.    LABORATORY DATA:  I have reviewed the labs as listed.  CBC Latest Ref Rng & Units 09/29/2021 09/25/2021 09/15/2021  WBC 4.0 - 10.5 K/uL 19.2(H) 2.9(L) 9.9  Hemoglobin 12.0 - 15.0 g/dL 8.6(L) 9.4(L) 10.7(L)  Hematocrit 36.0 - 46.0 % 25.5(L) 28.0(L) 32.3(L)  Platelets 150 - 400 K/uL 206 89(L) 150   CMP Latest Ref  Rng & Units 09/29/2021 09/25/2021 09/15/2021  Glucose 70 - 99 mg/dL 132(H) 151(H) 125(H)  BUN 6 - 20 mg/dL 9 13 11   Creatinine 0.44 -  1.00 mg/dL 0.83 1.09(H) 0.77  Sodium 135 - 145 mmol/L 135 128(L) 138  Potassium 3.5 - 5.1 mmol/L 2.5(LL) 3.6 3.3(L)  Chloride 98 - 111 mmol/L 103 94(L) 105  CO2 22 - 32 mmol/L 24 22 27   Calcium 8.9 - 10.3 mg/dL 8.4(L) 8.2(L) 8.4(L)  Total Protein 6.5 - 8.1 g/dL 6.1(L) 7.0 6.3(L)  Total Bilirubin 0.3 - 1.2 mg/dL 0.4 0.8 <0.1(L)  Alkaline Phos 38 - 126 U/L 80 103 93  AST 15 - 41 U/L 22 26 31   ALT 0 - 44 U/L 19 27 61(H)    DIAGNOSTIC IMAGING:  I have independently reviewed the scans and discussed with the patient. CT ABDOMEN PELVIS W CONTRAST  Result Date: 09/25/2021 CLINICAL DATA:  Bowel obstruction suspected EXAM: CT ABDOMEN AND PELVIS WITH CONTRAST TECHNIQUE: Multidetector CT imaging of the abdomen and pelvis was performed using the standard protocol following bolus administration of intravenous contrast. CONTRAST:  42m OMNIPAQUE IOHEXOL 300 MG/ML  SOLN COMPARISON:  06/29/2021 FINDINGS: Lower chest: No acute abnormality. Hepatobiliary: No focal liver abnormality. Gallbladder is distended without wall thickening or gallstones. No biliary dilatation. Pancreas: Unremarkable. No pancreatic ductal dilatation or surrounding inflammatory changes. Spleen: Normal in size without focal abnormality. Adrenals/Urinary Tract: Stable thickening of the adrenals. Kidneys are unremarkable. Bladder is decompressed and not well evaluated. Stomach/Bowel: Stomach is within normal limits. Rectosigmoid postoperative changes. Stool and air mildly distend the right, transverse, and proximal left colon. Sigmoid is more decompressed with no clear obstruction. Vascular/Lymphatic: No significant vascular abnormality. No enlarged lymph nodes. Reproductive: Intrauterine device is present.  No adnexal mass. Other: No free fluid.  Abdominal wall is unremarkable. Musculoskeletal: Lumbar spine degenerative changes. No acute osseous abnormality. IMPRESSION: Stool and air mildly distend the right, transverse, and proximal left colon.  The sigmoid colon is more decompressed without apparent obstruction. Electronically Signed   By: PMacy MisM.D.   On: 09/25/2021 12:48     ASSESSMENT:  1.  Rectosigmoid sigmoid adenocarcinoma: - Presentation with the bleeding per rectum for the last 5 to 6 months and constipation. - Colonoscopy on 02/18/2021 showed ulcerated partially obstructing mass found from 12-17 cm from the anal verge, circumferential measuring 5 cm in length. - Biopsy consistent with adenocarcinoma. - CT CAP on 03/06/2021 with circumferential wall thickening, hyperenhancement and fat stranding about the rectosigmoid junction, mass measuring approximately 6 cm in length.  No evidence of lymphadenopathy or metastatic disease. - CEA on 02/19/2021 was 1.8. - MRI of the pelvis on 04/06/2021 shows high rectal T3c/T4AN1 malignancy. - Chemoradiation therapy with Xeloda from 04/22/2021 through 06/01/2021. - CT CAP on 06/29/2021 showed improvement in the rectosigmoid thickening at the site of malignancy.  No clear adenopathy.  No evidence of metastatic disease. - Robotic assisted LAR on 07/10/2021 with pathology showing 2 cm grade 2, margins negative, 1/18 lymph nodes positive, no tumor deposits, positive treatment effect, no perineural invasion, no lymphovascular invasion, [id number], MMR preserved - Alexandra Bright will need completion colonoscopy after adjuvant therapy.   2.  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  Alexandra Bright quit smoking in October 2021, smoked 1 pack/day for 40 years. - Maternal aunt had lung cancer and was a non-smoker.  Paternal aunt had cancer, type not known to the patient.   PLAN:  1.  T3c/T4N1 rectosigmoid  adenocarcinoma: - Alexandra Bright had severe side effects after cycle 3 of FOLFOX. - Alexandra Bright felt mostly tired.  Alexandra Bright also had intermittent diarrhea.  Alexandra Bright was evaluated in the ER on 09/25/2021 when Alexandra Bright presented with vomiting and diarrhea. - CTAP with contrast on 09/25/2021 showed stool and air  mildly distended in the right transverse, proximal left colon.  Sigmoid colon is more decompressed with no apparent obstruction.  I have reviewed other ER records. - Alexandra Bright is still feeling very weak. - We will hold off on her treatment today.  We will give her chemo holiday for 2 weeks. - We will reevaluate her in the week of 10/26/2021. - We will consider decreasing the dose by 30% starting cycle 4.   2.  Mucositis: - Alexandra Bright has developed lower lip blisters after cycle 3. - Continue Blistex.  Would also recommend using lidocaine gel for pain relief.   3.  Peripheral neuropathy: - Mild numbness in the legs after COVID-vaccine is stable.  No worsening noted.  4.  Electrolyte abnormalities: - Alexandra Bright has severe hypokalemia with potassium 2.5 which will be repleted today.  We will start her on potassium 20 mEq daily. - We will also replete her magnesium today.  5.  Nausea: - Alexandra Bright did not feel well after taking Compazine even though it helped with nausea. - We will start her on Zofran 8 mg every 8 hours.   Orders placed this encounter:  No orders of the defined types were placed in this encounter.    Derek Jack, MD [place] [phone]   I, Thana Ates, am acting as a scribe for Dr. Derek Jack.  I, Derek Jack MD, have reviewed the above documentation for accuracy and completeness, and I agree with the above.

## 2021-09-29 ENCOUNTER — Other Ambulatory Visit: Payer: Self-pay

## 2021-09-29 ENCOUNTER — Inpatient Hospital Stay (HOSPITAL_COMMUNITY): Payer: 59 | Attending: Hematology

## 2021-09-29 ENCOUNTER — Inpatient Hospital Stay (HOSPITAL_COMMUNITY): Payer: 59

## 2021-09-29 ENCOUNTER — Inpatient Hospital Stay (HOSPITAL_BASED_OUTPATIENT_CLINIC_OR_DEPARTMENT_OTHER): Payer: 59 | Admitting: Hematology

## 2021-09-29 VITALS — BP 161/88 | HR 86 | Temp 98.1°F | Resp 18

## 2021-09-29 DIAGNOSIS — C20 Malignant neoplasm of rectum: Secondary | ICD-10-CM | POA: Diagnosis not present

## 2021-09-29 DIAGNOSIS — E860 Dehydration: Secondary | ICD-10-CM | POA: Insufficient documentation

## 2021-09-29 DIAGNOSIS — Z79899 Other long term (current) drug therapy: Secondary | ICD-10-CM | POA: Insufficient documentation

## 2021-09-29 DIAGNOSIS — Z5111 Encounter for antineoplastic chemotherapy: Secondary | ICD-10-CM | POA: Insufficient documentation

## 2021-09-29 DIAGNOSIS — E876 Hypokalemia: Secondary | ICD-10-CM | POA: Insufficient documentation

## 2021-09-29 DIAGNOSIS — C187 Malignant neoplasm of sigmoid colon: Secondary | ICD-10-CM | POA: Diagnosis not present

## 2021-09-29 DIAGNOSIS — C19 Malignant neoplasm of rectosigmoid junction: Secondary | ICD-10-CM | POA: Diagnosis present

## 2021-09-29 DIAGNOSIS — Z87891 Personal history of nicotine dependence: Secondary | ICD-10-CM | POA: Insufficient documentation

## 2021-09-29 DIAGNOSIS — Z95828 Presence of other vascular implants and grafts: Secondary | ICD-10-CM

## 2021-09-29 DIAGNOSIS — C189 Malignant neoplasm of colon, unspecified: Secondary | ICD-10-CM

## 2021-09-29 HISTORY — DX: Dehydration: E86.0

## 2021-09-29 LAB — CBC WITH DIFFERENTIAL/PLATELET
Band Neutrophils: 1 %
Basophils Absolute: 0 10*3/uL (ref 0.0–0.1)
Basophils Relative: 0 %
Eosinophils Absolute: 0 10*3/uL (ref 0.0–0.5)
Eosinophils Relative: 0 %
HCT: 25.5 % — ABNORMAL LOW (ref 36.0–46.0)
Hemoglobin: 8.6 g/dL — ABNORMAL LOW (ref 12.0–15.0)
Lymphocytes Relative: 18 %
Lymphs Abs: 3.5 10*3/uL (ref 0.7–4.0)
MCH: 32.1 pg (ref 26.0–34.0)
MCHC: 33.7 g/dL (ref 30.0–36.0)
MCV: 95.1 fL (ref 80.0–100.0)
Metamyelocytes Relative: 7 %
Monocytes Absolute: 1.3 10*3/uL — ABNORMAL HIGH (ref 0.1–1.0)
Monocytes Relative: 7 %
Myelocytes: 6 %
Neutro Abs: 11.3 10*3/uL — ABNORMAL HIGH (ref 1.7–7.7)
Neutrophils Relative %: 58 %
Platelets: 206 10*3/uL (ref 150–400)
Promyelocytes Relative: 3 %
RBC: 2.68 MIL/uL — ABNORMAL LOW (ref 3.87–5.11)
RDW: 14.7 % (ref 11.5–15.5)
WBC: 19.2 10*3/uL — ABNORMAL HIGH (ref 4.0–10.5)
nRBC: 0.2 % (ref 0.0–0.2)

## 2021-09-29 LAB — COMPREHENSIVE METABOLIC PANEL
ALT: 19 U/L (ref 0–44)
AST: 22 U/L (ref 15–41)
Albumin: 2.5 g/dL — ABNORMAL LOW (ref 3.5–5.0)
Alkaline Phosphatase: 80 U/L (ref 38–126)
Anion gap: 8 (ref 5–15)
BUN: 9 mg/dL (ref 6–20)
CO2: 24 mmol/L (ref 22–32)
Calcium: 8.4 mg/dL — ABNORMAL LOW (ref 8.9–10.3)
Chloride: 103 mmol/L (ref 98–111)
Creatinine, Ser: 0.83 mg/dL (ref 0.44–1.00)
GFR, Estimated: >60 mL/min (ref >60)
Glucose, Bld: 132 mg/dL — ABNORMAL HIGH (ref 70–99)
Potassium: 2.5 mmol/L — CL (ref 3.5–5.1)
Sodium: 135 mmol/L (ref 135–145)
Total Bilirubin: 0.4 mg/dL (ref 0.3–1.2)
Total Protein: 6.1 g/dL — ABNORMAL LOW (ref 6.5–8.1)

## 2021-09-29 LAB — MAGNESIUM: Magnesium: 1.6 mg/dL — ABNORMAL LOW (ref 1.7–2.4)

## 2021-09-29 MED ORDER — POTASSIUM CHLORIDE IN NACL 20-0.9 MEQ/L-% IV SOLN
Freq: Once | INTRAVENOUS | Status: AC
  Filled 2021-09-29: qty 1000

## 2021-09-29 MED ORDER — SODIUM CHLORIDE 0.9% FLUSH
10.0000 mL | Freq: Once | INTRAVENOUS | Status: AC | PRN
  Administered 2021-09-29: 10 mL

## 2021-09-29 MED ORDER — ONDANSETRON HCL 8 MG PO TABS
8.0000 mg | ORAL_TABLET | Freq: Three times a day (TID) | ORAL | 2 refills | Status: DC | PRN
Start: 2021-09-29 — End: 2022-01-25

## 2021-09-29 MED ORDER — POTASSIUM CHLORIDE CRYS ER 20 MEQ PO TBCR
40.0000 meq | EXTENDED_RELEASE_TABLET | ORAL | Status: AC
  Administered 2021-09-29 (×2): 40 meq via ORAL
  Filled 2021-09-29 (×2): qty 2

## 2021-09-29 MED ORDER — HEPARIN SOD (PORK) LOCK FLUSH 100 UNIT/ML IV SOLN
500.0000 [IU] | Freq: Once | INTRAVENOUS | Status: AC | PRN
  Administered 2021-09-29: 500 [IU]

## 2021-09-29 MED ORDER — POTASSIUM CHLORIDE CRYS ER 20 MEQ PO TBCR: 20.0000 meq | EXTENDED_RELEASE_TABLET | Freq: Every day | ORAL | 3 refills | Status: DC

## 2021-09-29 MED ORDER — MAGNESIUM SULFATE 2 GM/50ML IV SOLN
2.0000 g | Freq: Once | INTRAVENOUS | Status: AC
  Administered 2021-09-29: 2 g via INTRAVENOUS
  Filled 2021-09-29: qty 50

## 2021-09-29 NOTE — Patient Instructions (Signed)
[facility]  Discharge Instructions: Thank you for choosing [facility] to provide your oncology and hematology care.  If you have a lab appointment with the [facility], please come in thru the Main Entrance and check in at the main information desk.  Wear comfortable clothing and clothing appropriate for easy access to any Portacath or PICC line.   We strive to give you quality time with your provider. You may need to reschedule your appointment if you arrive late (15 or more minutes).  Arriving late affects you and other patients whose appointments are after yours.  Also, if you miss three or more appointments without notifying the office, you may be dismissed from the clinic at the provider's discretion.      For prescription refill requests, have your pharmacy contact our office and allow 72 hours for refills to be completed.    Today you received the following: hydration fluids.       To help prevent nausea and vomiting after your treatment, we encourage you to take your nausea medication as directed.  BELOW ARE SYMPTOMS THAT SHOULD BE REPORTED IMMEDIATELY: *FEVER GREATER THAN 100.4 F (38 C) OR HIGHER *CHILLS OR SWEATING *NAUSEA AND VOMITING THAT IS NOT CONTROLLED WITH YOUR NAUSEA MEDICATION *UNUSUAL SHORTNESS OF BREATH *UNUSUAL BRUISING OR BLEEDING *URINARY PROBLEMS (pain or burning when urinating, or frequent urination) *BOWEL PROBLEMS (unusual diarrhea, constipation, pain near the anus) TENDERNESS IN MOUTH AND THROAT WITH OR WITHOUT PRESENCE OF ULCERS (sore throat, sores in mouth, or a toothache) UNUSUAL RASH, SWELLING OR PAIN  UNUSUAL VAGINAL DISCHARGE OR ITCHING   Items with * indicate a potential emergency and should be followed up as soon as possible or go to the Emergency Department if any problems should occur.  Please show the CHEMOTHERAPY ALERT CARD or IMMUNOTHERAPY ALERT CARD at check-in to the Emergency Department and triage  nurse.  Should you have questions after your visit or need to cancel or reschedule your appointment, please contact [facility] [phone]  and follow the prompts.  Office hours are 8:00 a.m. to 4:30 p.m. Monday - Friday. Please note that voicemails left after 4:00 p.m. may not be returned until the following business day.  We are closed weekends and major holidays. You have access to a nurse at all times for urgent questions. Please call the main number to the clinic [phone] and follow the prompts.  For any non-urgent questions, you may also contact your provider using MyChart. We now offer e-Visits for anyone 60 and older to request care online for non-urgent symptoms. For details visit [email or web address].   Also download the MyChart app! Go to the app store, search "MyChart", open the app, select [facility], and log in with your MyChart username and password.  Due to Covid, a mask is required upon entering the hospital/clinic. If you do not have a mask, one will be given to you upon arrival. For doctor visits, patients may have 1 support person aged 34 or older with them. For treatment visits, patients cannot have anyone with them due to current Covid guidelines and our immunocompromised population.

## 2021-09-29 NOTE — Patient Instructions (Addendum)
[facility] at [facility] Discharge Instructions   You were seen and examined today by Dr. Delton Coombes.  We will hold treatment today and give you IV fluids to supplement your potassium and magnesium being low.  We have sent a prescription for Zofran for nausea and potassium supplement to your pharmacy.  Take as prescribed.   Start taking Pepcid and Carafate that you received from the ER.  DO NOT take the Phenergan they prescribed you.   Return as scheduled on the 21st of November to receive your next treatement.    Thank you for choosing [facility] at [facility] to provide your oncology and hematology care.  To afford each patient quality time with our provider, please arrive at least 15 minutes before your scheduled appointment time.   If you have a lab appointment with the [facility] please come in thru the Main Entrance and check in at the main information desk.  You need to re-schedule your appointment should you arrive 10 or more minutes late.  We strive to give you quality time with our providers, and arriving late affects you and other patients whose appointments are after yours.  Also, if you no show three or more times for appointments you may be dismissed from the clinic at the providers discretion.     Again, thank you for choosing [facility].  Our hope is that these requests will decrease the amount of time that you wait before being seen by our physicians.       _____________________________________________________________  Should you have questions after your visit to [facility], please contact our office at [phone] and follow the prompts.  Our office hours are 8:00 a.m. and 4:30 p.m. Monday - Friday.  Please note that voicemails left after 4:00 p.m. may not be returned until the following business day.  We are closed weekends and major holidays.  You do have access to a nurse 24-7, just call the  main number to the clinic [phone] and do not press any options, hold on the line and a nurse will answer the phone.    For prescription refill requests, have your pharmacy contact our office and allow 72 hours.    Due to Covid, you will need to wear a mask upon entering the hospital. If you do not have a mask, a mask will be given to you at the Main Entrance upon arrival. For doctor visits, patients may have 1 support person age 63 or older with them. For treatment visits, patients can not have anyone with them due to social distancing guidelines and our immunocompromised population.

## 2021-09-29 NOTE — Progress Notes (Signed)
Patient presents today for treatment and follow up visit with Dr. Delton Coombes. Potassium 2.5 today. Magnesium 1.6. Heart rate 105 on arrival.   Message received from [place] / Dr. Delton Coombes no treatment today. House fluids only. Magnesium Sulfate IVPB 2 Grams and KCL 20 mEq/1 liter of NS.   Hydration given today per MD orders. Tolerated infusion without adverse affects. Vital signs stable. No complaints at this time. Discharged from clinic ambulatory in stable condition. Alert and oriented x 3. F/U with [facility] as scheduled.

## 2021-09-29 NOTE — Progress Notes (Signed)
CRITICAL VALUE ALERT Critical value received:  K+ 2.5 Date of notification:  09-29-21 Time of notification: 1000 Critical value read back:  Yes.   Nurse who received alert:  C.Mohd Clemons RN MD notified time and response:  see orders, Dr. Raliegh Ip

## 2021-09-30 ENCOUNTER — Encounter (HOSPITAL_COMMUNITY): Payer: Self-pay | Admitting: Hematology

## 2021-10-01 ENCOUNTER — Encounter (HOSPITAL_COMMUNITY): Payer: 59

## 2021-10-07 ENCOUNTER — Encounter (HOSPITAL_COMMUNITY): Payer: Self-pay | Admitting: Hematology

## 2021-10-12 ENCOUNTER — Inpatient Hospital Stay (HOSPITAL_COMMUNITY): Payer: 59

## 2021-10-12 ENCOUNTER — Other Ambulatory Visit: Payer: Self-pay

## 2021-10-12 VITALS — BP 135/84 | HR 98 | Temp 97.3°F | Resp 20

## 2021-10-12 DIAGNOSIS — C20 Malignant neoplasm of rectum: Secondary | ICD-10-CM

## 2021-10-12 DIAGNOSIS — C187 Malignant neoplasm of sigmoid colon: Secondary | ICD-10-CM

## 2021-10-12 DIAGNOSIS — Z5111 Encounter for antineoplastic chemotherapy: Secondary | ICD-10-CM | POA: Diagnosis not present

## 2021-10-12 DIAGNOSIS — Z95828 Presence of other vascular implants and grafts: Secondary | ICD-10-CM

## 2021-10-12 LAB — CBC WITH DIFFERENTIAL/PLATELET
Abs Immature Granulocytes: 0.15 10*3/uL — ABNORMAL HIGH (ref 0.00–0.07)
Basophils Absolute: 0.1 10*3/uL (ref 0.0–0.1)
Basophils Relative: 1 %
Eosinophils Absolute: 0 10*3/uL (ref 0.0–0.5)
Eosinophils Relative: 0 %
HCT: 30 % — ABNORMAL LOW (ref 36.0–46.0)
Hemoglobin: 9.6 g/dL — ABNORMAL LOW (ref 12.0–15.0)
Immature Granulocytes: 1 %
Lymphocytes Relative: 17 %
Lymphs Abs: 1.7 10*3/uL (ref 0.7–4.0)
MCH: 33.8 pg (ref 26.0–34.0)
MCHC: 32 g/dL (ref 30.0–36.0)
MCV: 105.6 fL — ABNORMAL HIGH (ref 80.0–100.0)
Monocytes Absolute: 1.6 10*3/uL — ABNORMAL HIGH (ref 0.1–1.0)
Monocytes Relative: 15 %
Neutro Abs: 6.8 10*3/uL (ref 1.7–7.7)
Neutrophils Relative %: 66 %
Platelets: 326 10*3/uL (ref 150–400)
RBC: 2.84 MIL/uL — ABNORMAL LOW (ref 3.87–5.11)
RDW: 19.9 % — ABNORMAL HIGH (ref 11.5–15.5)
WBC: 10.4 10*3/uL (ref 4.0–10.5)
nRBC: 0 % (ref 0.0–0.2)

## 2021-10-12 LAB — COMPREHENSIVE METABOLIC PANEL
ALT: 18 U/L (ref 0–44)
AST: 19 U/L (ref 15–41)
Albumin: 3.1 g/dL — ABNORMAL LOW (ref 3.5–5.0)
Alkaline Phosphatase: 80 U/L (ref 38–126)
Anion gap: 7 (ref 5–15)
BUN: 11 mg/dL (ref 6–20)
CO2: 23 mmol/L (ref 22–32)
Calcium: 8.9 mg/dL (ref 8.9–10.3)
Chloride: 106 mmol/L (ref 98–111)
Creatinine, Ser: 0.71 mg/dL (ref 0.44–1.00)
GFR, Estimated: >60 mL/min (ref >60)
Glucose, Bld: 133 mg/dL — ABNORMAL HIGH (ref 70–99)
Potassium: 3.8 mmol/L (ref 3.5–5.1)
Sodium: 136 mmol/L (ref 135–145)
Total Bilirubin: 0.4 mg/dL (ref 0.3–1.2)
Total Protein: 6.5 g/dL (ref 6.5–8.1)

## 2021-10-12 LAB — MAGNESIUM: Magnesium: 1.7 mg/dL (ref 1.7–2.4)

## 2021-10-12 MED ORDER — SODIUM CHLORIDE 0.9 % IV SOLN
10.0000 mg | Freq: Once | INTRAVENOUS | Status: AC
  Administered 2021-10-12: 10 mg via INTRAVENOUS
  Filled 2021-10-12: qty 10

## 2021-10-12 MED ORDER — PALONOSETRON HCL INJECTION 0.25 MG/5ML
0.2500 mg | Freq: Once | INTRAVENOUS | Status: AC
  Administered 2021-10-12: 0.25 mg via INTRAVENOUS
  Filled 2021-10-12: qty 5

## 2021-10-12 MED ORDER — DEXTROSE 5 % IV SOLN: Freq: Once | INTRAVENOUS | Status: AC

## 2021-10-12 MED ORDER — OXALIPLATIN CHEMO INJECTION 100 MG/20ML
56.6667 mg/m2 | Freq: Once | INTRAVENOUS | Status: AC
  Administered 2021-10-12: 90 mg via INTRAVENOUS
  Filled 2021-10-12: qty 18

## 2021-10-12 MED ORDER — SODIUM CHLORIDE 0.9 % IV SOLN
1600.0000 mg/m2 | INTRAVENOUS | Status: DC
  Administered 2021-10-12: 2550 mg via INTRAVENOUS
  Filled 2021-10-12: qty 51

## 2021-10-12 MED ORDER — FLUOROURACIL CHEMO INJECTION 500 MG/10ML
266.6667 mg/m2 | Freq: Once | INTRAVENOUS | Status: AC
  Administered 2021-10-12: 400 mg via INTRAVENOUS
  Filled 2021-10-12: qty 8

## 2021-10-12 MED ORDER — LEUCOVORIN CALCIUM INJECTION 350 MG
266.6667 mg/m2 | Freq: Once | INTRAVENOUS | Status: AC
  Administered 2021-10-12: 424 mg via INTRAVENOUS
  Filled 2021-10-12: qty 21.2

## 2021-10-12 NOTE — Progress Notes (Signed)
Pt presents today for FOLFOX and 5FU per provider's order. Vital signs and labs WNL for treatment today. Re-checked HR 89.Okay for treatment today. Pt voiced no new complaints at this time.   FOLFOX and 5FU given today per MD orders. Tolerated infusion without adverse affects. Vital signs stable. No complaints at this time. Discharged from clinic ambulatory in stable condition. Alert and oriented x 3. F/U with [facility] as scheduled. 5FU ambulatory pump infusing.

## 2021-10-12 NOTE — Patient Instructions (Signed)
[facility]  Discharge Instructions: Thank you for choosing [facility] to provide your oncology and hematology care.  If you have a lab appointment with the [facility], please come in thru the Main Entrance and check in at the main information desk.  Wear comfortable clothing and clothing appropriate for easy access to any Portacath or PICC line.   We strive to give you quality time with your provider. You may need to reschedule your appointment if you arrive late (15 or more minutes).  Arriving late affects you and other patients whose appointments are after yours.  Also, if you miss three or more appointments without notifying the office, you may be dismissed from the clinic at the provider's discretion.      For prescription refill requests, have your pharmacy contact our office and allow 72 hours for refills to be completed.    Today you received the following chemotherapy and/or immunotherapy agents FOLFOX and 5FU.   To help prevent nausea and vomiting after your treatment, we encourage you to take your nausea medication as directed.  The chemotherapy medication bag should finish at 46 hours, 96 hours, or 7 days. For example, if your pump is scheduled for 46 hours and it was put on at 4:00 p.m., it should finish at 2:00 p.m. the day it is scheduled to come off regardless of your appointment time.     Estimated time to finish at 1130.   If the display on your pump reads "Low Volume" and it is beeping, take the batteries out of the pump and come to the cancer center for it to be taken off.   If the pump alarms go off prior to the pump reading "Low Volume" then call [phone] and someone can assist you.  If the plunger comes out and the chemotherapy medication is leaking out, please use your home chemo spill kit to clean up the spill. Do NOT use paper towels or other household products.  If you have problems or questions regarding your pump, please call  either [phone] (24 hours a day) or the cancer center Monday-Friday 8:00 a.m.- 4:30 p.m. at the clinic number and we will assist you. If you are unable to get assistance, then go to the nearest Emergency Department and ask the staff to contact the IV team for assistance.    BELOW ARE SYMPTOMS THAT SHOULD BE REPORTED IMMEDIATELY: *FEVER GREATER THAN 100.4 F (38 C) OR HIGHER *CHILLS OR SWEATING *NAUSEA AND VOMITING THAT IS NOT CONTROLLED WITH YOUR NAUSEA MEDICATION *UNUSUAL SHORTNESS OF BREATH *UNUSUAL BRUISING OR BLEEDING *URINARY PROBLEMS (pain or burning when urinating, or frequent urination) *BOWEL PROBLEMS (unusual diarrhea, constipation, pain near the anus) TENDERNESS IN MOUTH AND THROAT WITH OR WITHOUT PRESENCE OF ULCERS (sore throat, sores in mouth, or a toothache) UNUSUAL RASH, SWELLING OR PAIN  UNUSUAL VAGINAL DISCHARGE OR ITCHING   Items with * indicate a potential emergency and should be followed up as soon as possible or go to the Emergency Department if any problems should occur.  Please show the CHEMOTHERAPY ALERT CARD or IMMUNOTHERAPY ALERT CARD at check-in to the Emergency Department and triage nurse.  Should you have questions after your visit or need to cancel or reschedule your appointment, please contact [facility] [phone]  and follow the prompts.  Office hours are 8:00 a.m. to 4:30 p.m. Monday - Friday. Please note that voicemails left after 4:00 p.m. may not be returned until the following business day.  We are closed weekends and major holidays. You have access to a nurse at all times for urgent questions. Please call the main number to the clinic [phone] and follow the prompts.  For any non-urgent questions, you may also contact your provider using MyChart. We now offer e-Visits for anyone 34 and older to request care online for non-urgent symptoms. For details visit [email or web address].   Also download the MyChart app! Go to the app  store, search "MyChart", open the app, select [facility], and log in with your MyChart username and password.  Due to Covid, a mask is required upon entering the hospital/clinic. If you do not have a mask, one will be given to you upon arrival. For doctor visits, patients may have 1 support person aged 48 or older with them. For treatment visits, patients cannot have anyone with them due to current Covid guidelines and our immunocompromised population.

## 2021-10-14 ENCOUNTER — Inpatient Hospital Stay (HOSPITAL_COMMUNITY): Payer: 59

## 2021-10-14 ENCOUNTER — Other Ambulatory Visit: Payer: Self-pay

## 2021-10-14 VITALS — BP 134/87 | HR 106 | Temp 97.6°F | Resp 20

## 2021-10-14 DIAGNOSIS — Z95828 Presence of other vascular implants and grafts: Secondary | ICD-10-CM

## 2021-10-14 DIAGNOSIS — Z5111 Encounter for antineoplastic chemotherapy: Secondary | ICD-10-CM | POA: Diagnosis not present

## 2021-10-14 DIAGNOSIS — C20 Malignant neoplasm of rectum: Secondary | ICD-10-CM

## 2021-10-14 MED ORDER — HEPARIN SOD (PORK) LOCK FLUSH 100 UNIT/ML IV SOLN
500.0000 [IU] | Freq: Once | INTRAVENOUS | Status: AC | PRN
  Administered 2021-10-14: 500 [IU]

## 2021-10-14 MED ORDER — PEGFILGRASTIM-CBQV 6 MG/0.6ML ~~LOC~~ SOSY
6.0000 mg | PREFILLED_SYRINGE | Freq: Once | SUBCUTANEOUS | Status: AC
  Administered 2021-10-14: 6 mg via SUBCUTANEOUS
  Filled 2021-10-14: qty 0.6

## 2021-10-14 MED ORDER — SODIUM CHLORIDE 0.9% FLUSH
10.0000 mL | INTRAVENOUS | Status: DC | PRN
  Administered 2021-10-14: 10 mL

## 2021-10-14 NOTE — Patient Instructions (Signed)
[facility]  Discharge Instructions: Thank you for choosing [facility] to provide your oncology and hematology care.  If you have a lab appointment with the [facility], please come in thru the Main Entrance and check in at the main information desk.  Wear comfortable clothing and clothing appropriate for easy access to any Portacath or PICC line.   We strive to give you quality time with your provider. You may need to reschedule your appointment if you arrive late (15 or more minutes).  Arriving late affects you and other patients whose appointments are after yours.  Also, if you miss three or more appointments without notifying the office, you may be dismissed from the clinic at the provider's discretion.      For prescription refill requests, have your pharmacy contact our office and allow 72 hours for refills to be completed.    Today you received the following chemotherapy and/or immunotherapy agents Pump DC and Udenyca      To help prevent nausea and vomiting after your treatment, we encourage you to take your nausea medication as directed.  BELOW ARE SYMPTOMS THAT SHOULD BE REPORTED IMMEDIATELY: *FEVER GREATER THAN 100.4 F (38 C) OR HIGHER *CHILLS OR SWEATING *NAUSEA AND VOMITING THAT IS NOT CONTROLLED WITH YOUR NAUSEA MEDICATION *UNUSUAL SHORTNESS OF BREATH *UNUSUAL BRUISING OR BLEEDING *URINARY PROBLEMS (pain or burning when urinating, or frequent urination) *BOWEL PROBLEMS (unusual diarrhea, constipation, pain near the anus) TENDERNESS IN MOUTH AND THROAT WITH OR WITHOUT PRESENCE OF ULCERS (sore throat, sores in mouth, or a toothache) UNUSUAL RASH, SWELLING OR PAIN  UNUSUAL VAGINAL DISCHARGE OR ITCHING   Items with * indicate a potential emergency and should be followed up as soon as possible or go to the Emergency Department if any problems should occur.  Please show the CHEMOTHERAPY ALERT CARD or IMMUNOTHERAPY ALERT CARD at check-in to the  Emergency Department and triage nurse.  Should you have questions after your visit or need to cancel or reschedule your appointment, please contact [facility] [phone]  and follow the prompts.  Office hours are 8:00 a.m. to 4:30 p.m. Monday - Friday. Please note that voicemails left after 4:00 p.m. may not be returned until the following business day.  We are closed weekends and major holidays. You have access to a nurse at all times for urgent questions. Please call the main number to the clinic [phone] and follow the prompts.  For any non-urgent questions, you may also contact your provider using MyChart. We now offer e-Visits for anyone 12 and older to request care online for non-urgent symptoms. For details visit [email or web address].   Also download the MyChart app! Go to the app store, search "MyChart", open the app, select [facility], and log in with your MyChart username and password.  Due to Covid, a mask is required upon entering the hospital/clinic. If you do not have a mask, one will be given to you upon arrival. For doctor visits, patients may have 1 support person aged 47 or older with them. For treatment visits, patients cannot have anyone with them due to current Covid guidelines and our immunocompromised population.

## 2021-10-14 NOTE — Progress Notes (Signed)
Patient presents today for 5FU pump stop and disconnection after 46 hour continous infusion.   5FU pump deaccessed.  Patients port flushed without difficulty.  Good blood return noted with no bruising or swelling noted at site.  needle removed intact.  Band aid applied.   Udenyca administration without incident; injection site WNL; see MAR for injection details.  Patient tolerated procedure well and without incident.  No questions or complaints noted at this time.  VSS with discharge and left in satisfactory condition ambulatory with no s/s of distress noted.

## 2021-10-25 NOTE — Progress Notes (Signed)
Alexandra Bright, [place] [address]   CLINIC:  Medical Oncology/Hematology  PCP:  Lindell Spar, MD [address] / [place] [address] [phone]   REASON FOR VISIT:  Follow-up for sigmoid adenocarcinoma  PRIOR THERAPY: none  NGS Results: not done  CURRENT THERAPY: FOLFOX every 2 weeks x 4 months  BRIEF ONCOLOGIC HISTORY:  Oncology History  Rectal cancer ([facility])  04/08/2021 Initial Diagnosis   Rectal cancer ([facility])   04/20/2021 - 04/20/2021 Chemotherapy          08/18/2021 -  Chemotherapy   Patient is on Treatment Plan : COLORECTAL FOLFOX q14d x 4 months       CANCER STAGING:  Cancer Staging  Rectal cancer ([facility]) Staging form: Colon and Rectum, AJCC 8th Edition - Clinical stage from 04/08/2021: Stage IIIB (cT3, cN1b, cM0) - Unsigned   INTERVAL HISTORY:  [name], a 55 y.o. female, returns for routine follow-up and consideration for next cycle of chemotherapy. Alexandra Bright was last seen on 09/29/2021.  Due for cycle #5 of FOLFOX today.   Overall, she tells me she has been feeling pretty well. She reports daily bilateral temporal headaches in the afternoons as well as dizziness; these headaches have occurred previously. She does not monitor her BP at home. She denies n/v/d and mouth sores. She reports SOB with exertion as well as hair thinning. She reports constipation for 3 days following treatment which has since resolved. Her appetite is good. She denies tingling/numbness. She reports 1 week following treatment she has sensitivity in her mouth upon eating food of any temperature.   Overall, she feels ready for next cycle of chemo today.   REVIEW OF SYSTEMS:  Review of Systems  Constitutional:  Negative for appetite change and fatigue (75%).  Respiratory:  Positive for shortness of breath.   Gastrointestinal:  Negative for constipation (resolved), diarrhea, nausea and vomiting.  Neurological:  Positive for dizziness and  headaches.  All other systems reviewed and are negative.  PAST MEDICAL/SURGICAL HISTORY:  Past Medical History:  Diagnosis Date   Anxiety    Colon cancer ([facility]) 01/2021   Constipation    GERD (gastroesophageal reflux disease)    Hypertension    Port-A-Cath in place 08/17/2021   Pre-diabetes    Past Surgical History:  Procedure Laterality Date   BIOPSY  02/18/2021   Procedure: BIOPSY;  Surgeon: Harvel Quale, MD;  Location[facility];  Service: Gastroenterology;;   FACIAL FRACTURE SURGERY  1991   from [place] [facility]  02/18/2021   Procedure: FLEXIBLE SIGMOIDOSCOPY;  Surgeon: Harvel Quale, MD;  Location: [facility];  Service: Gastroenterology;;   IR IMAGING GUIDED PORT INSERTION  08/13/2021   POLYPECTOMY  02/18/2021   Procedure: POLYPECTOMY INTESTINAL;  Surgeon: Harvel Quale, MD;  Location: [facility];  Service: Gastroenterology;;   SUBMUCOSAL TATTOO INJECTION  02/18/2021   Procedure: SUBMUCOSAL TATTOO INJECTION;  Surgeon: Harvel Quale, MD;  Location: [facility];  Service: Gastroenterology;;   XI ROBOTIC ASSISTED LOWER ANTERIOR RESECTION N/A 07/10/2021   Procedure: XI ROBOTIC ASSISTED LOWER ANTERIOR RESECTION;  Surgeon: Leighton Ruff, MD;  Location: [facility];  Service: General;  Laterality: N/A;    SOCIAL HISTORY:  Social History   Socioeconomic History   Marital status: Married    Spouse name: Not on file   Number of children: Not on file   Years of education: Not on file   Highest education  level: Not on file  Occupational History   Not on file  Tobacco Use   Smoking status: Former    Packs/day: 0.50    Years: 40.00    Pack years: 20.00    Types: Cigarettes    Quit date: 2020    Years since quitting: 2.9   Smokeless tobacco: Never  Vaping Use   Vaping Use: Never used  Substance and Sexual Activity   Alcohol use: Not Currently   Drug use: Never   Sexual activity: Not Currently  Other Topics  Concern   Not on file  Social History Narrative   Not on file   Social Determinants of Health   Financial Resource Strain: Low Risk    Difficulty of Paying Living Expenses: Not hard at all  Food Insecurity: No Food Insecurity   Worried About Charity fundraiser in the Last Year: Never true   [place] in the Last Year: Never true  Transportation Needs: No Transportation Needs   Lack of Transportation (Medical): No   Lack of Transportation (Non-Medical): No  Physical Activity: Insufficiently Active   Days of Exercise per Week: 3 days   Minutes of Exercise per Session: 20 min  Stress: No Stress Concern Present   Feeling of Stress : Only a little  Social Connections: Engineer, building services of Communication with Friends and Family: More than three times a week   Frequency of Social Gatherings with Friends and Family: More than three times a week   Attends Religious Services: More than 4 times per year   Active Member of [employer] or Organizations: Yes   Attends Music therapist: More than 4 times per year   Marital Status: Married  Human resources officer Violence: Not At Risk   Fear of Current or Ex-Partner: No   Emotionally Abused: No   Physically Abused: No   Sexually Abused: No    FAMILY HISTORY:  Family History  Problem Relation Age of Onset   Diabetes Sister    Cancer Maternal Aunt    Ovarian cancer Cousin    Ovarian cancer Paternal Aunt    Prostate cancer Cousin     CURRENT MEDICATIONS:  Current Outpatient Medications  Medication Sig Dispense Refill   benzocaine (ORAJEL) 10 % mucosal gel Use as directed 1 application in the mouth or throat as needed for mouth pain.     buPROPion (WELLBUTRIN SR) 200 MG 12 hr tablet Take 1 tablet (200 mg total) by mouth 2 (two) times daily. 60 tablet 5   dicyclomine (BENTYL) 10 MG capsule Take 10 mg by mouth 2 (two) times daily as needed for spasms.     diphenhydrAMINE (BENADRYL) 25 MG tablet Take 25 mg by mouth  every 6 (six) hours as needed for allergies.     famotidine (PEPCID) 20 MG tablet Take 1 tablet (20 mg total) by mouth 2 (two) times daily. 30 tablet 0   fluorouracil CALGB 26378 2,400 mg/m2 in sodium chloride 0.9 % 150 mL Inject 2,400 mg/m2 into the vein over 48 hr.     FLUOROURACIL IV Inject into the vein every 14 (fourteen) days.     LEUCOVORIN CALCIUM IV Inject into the vein every 14 (fourteen) days.     lidocaine-prilocaine (EMLA) cream Apply a small amount to port a cath site and cover with plastic wrap 1 hour prior to chemotherapy appointments 30 g 3   loperamide (IMODIUM) 1 MG/5ML solution Take 2 mg by mouth as needed for  diarrhea or loose stools.     losartan (COZAAR) 50 MG tablet Take 1 tablet by mouth once daily (Patient taking differently: Take 50 mg by mouth daily.) 90 tablet 0   Multiple Vitamins-Minerals (MULTIVITAMIN WITH MINERALS) tablet Take 1 tablet by mouth in the morning. Women's Multivitamin     ondansetron (ZOFRAN) 8 MG tablet Take 1 tablet (8 mg total) by mouth every 8 (eight) hours as needed for nausea or vomiting. 20 tablet 2   OXALIPLATIN IV Inject into the vein every 14 (fourteen) days.     prochlorperazine (COMPAZINE) 10 MG tablet Take 1 tablet (10 mg total) by mouth every 6 (six) hours as needed (Nausea or vomiting). 30 tablet 1   sennosides-docusate sodium (SENOKOT-S) 8.6-50 MG tablet Take 1 tablet by mouth daily as needed for constipation.     simethicone (MYLICON) 80 MG chewable tablet Chew 80 mg by mouth every 6 (six) hours as needed for flatulence.     sucralfate (CARAFATE) 1 g tablet Take 1 tablet (1 g total) by mouth 4 (four) times daily -  with meals and at bedtime. 21 tablet 0   traMADol (ULTRAM) 50 MG tablet Take 1 tablet (50 mg total) by mouth 2 (two) times daily as needed. (Patient taking differently: Take 50 mg by mouth 2 (two) times daily as needed for moderate pain.) 60 tablet 0   potassium chloride SA (KLOR-CON) 20 MEQ tablet Take 1 tablet (20 mEq total)  by mouth daily. (Patient not taking: Reported on 10/26/2021) 30 tablet 3   No current facility-administered medications for this visit.   Facility-Administered Medications Ordered in Other Visits  Medication Dose Route Frequency Provider Last Rate Last Admin   sodium chloride flush (NS) 0.9 % injection 10 mL  10 mL Intracatheter PRN Derek Jack, MD   10 mL at 09/03/21 1337    ALLERGIES:  No Known Allergies  PHYSICAL EXAM:  Performance status (ECOG): 1 - Symptomatic but completely ambulatory  Vitals:   10/26/21 0919  BP: 122/85  Pulse: (!) 106  Resp: 18  Temp: (!) 96.7 F (35.9 C)  SpO2: 99%   Wt Readings from Last 3 Encounters:  10/26/21 128 lb (58.1 kg)  09/29/21 124 lb 6.4 oz (56.4 kg)  09/25/21 126 lb (57.2 kg)   Physical Exam Vitals reviewed.  Constitutional:      Appearance: Normal appearance.  Cardiovascular:     Rate and Rhythm: Normal rate and regular rhythm.     Pulses: Normal pulses.     Heart sounds: Normal heart sounds.  Pulmonary:     Effort: Pulmonary effort is normal.     Breath sounds: Normal breath sounds.  Neurological:     General: No focal deficit present.     Mental Status: She is alert and oriented to person, place, and time.  Psychiatric:        Mood and Affect: Mood normal.        Behavior: Behavior normal.    LABORATORY DATA:  I have reviewed the labs as listed.  CBC Latest Ref Rng & Units 10/26/2021 10/12/2021 09/29/2021  WBC 4.0 - 10.5 K/uL 10.6(H) 10.4 19.2(H)  Hemoglobin 12.0 - 15.0 g/dL 8.8(L) 9.6(L) 8.6(L)  Hematocrit 36.0 - 46.0 % 27.4(L) 30.0(L) 25.5(L)  Platelets 150 - 400 K/uL 201 326 206   CMP Latest Ref Rng & Units 10/26/2021 10/12/2021 09/29/2021  Glucose 70 - 99 mg/dL 120(H) 133(H) 132(H)  BUN 6 - 20 mg/dL _0 Creatinine 0.44 - 1.00  mg/dL 0.76 0.71 0.83  Sodium 135 - 145 mmol/L 136 136 135  Potassium 3.5 - 5.1 mmol/L 3.6 3.8 2.5(LL)  Chloride 98 - 111 mmol/L 106 106 103  CO2 22 - 32 mmol/L _0 Calcium 8.9 - 10.3 mg/dL 9.2 8.9 8.4(L)  Total Protein 6.5 - 8.1 g/dL 6.6 6.5 6.1(L)  Total Bilirubin 0.3 - 1.2 mg/dL 0.7 0.4 0.4  Alkaline Phos 38 - 126 U/L 107 80 80  AST 15 - 41 U/L _1 ALT 0 - 44 U/L _2 DIAGNOSTIC IMAGING:  I have independently reviewed the scans and discussed with the patient. No results found.   ASSESSMENT:  1.  Rectosigmoid sigmoid adenocarcinoma: - Presentation with the bleeding per rectum for the last 5 to 6 months and constipation. - Colonoscopy on 02/18/2021 showed ulcerated partially obstructing mass found from 12-17 cm from the anal verge, circumferential measuring 5 cm in length. - Biopsy consistent with adenocarcinoma. - CT CAP on 03/06/2021 with circumferential wall thickening, hyperenhancement and fat stranding about the rectosigmoid junction, mass measuring approximately 6 cm in length.  No evidence of lymphadenopathy or metastatic disease. - CEA on 02/19/2021 was 1.8. - MRI of the pelvis on 04/06/2021 shows high rectal T3c/T4AN1 malignancy. - Chemoradiation therapy with Xeloda from 04/22/2021 through 06/01/2021. - CT CAP on 06/29/2021 showed improvement in the rectosigmoid thickening at the site of malignancy.  No clear adenopathy.  No evidence of metastatic disease. - Robotic assisted LAR on 07/10/2021 with pathology showing 2 cm grade 2, margins negative, 1/18 lymph nodes positive, no tumor deposits, positive treatment effect, no perineural invasion, no lymphovascular invasion, [id number], MMR preserved - She will need completion colonoscopy after adjuvant therapy.   2.  Social/family history: - She worked as a Secretary/administrator and recently stopped working.  She quit smoking in October 2021, smoked 1 pack/day for 40 years. - Maternal aunt had lung cancer and was a non-smoker.  Paternal aunt had cancer, type not known to the patient.   PLAN:  1.  T3c/T4N1 rectosigmoid  adenocarcinoma: - CTAP with contrast on 09/25/2021 showed stool and air mildly  distended in the right transverse, proximal left colon.  Sigmoid colon is decompressed with no apparent obstruction. -Cycle 4 was dose reduced.  She tolerated well. -She had cold sensitivity but denies any tingling or numbness. - She reported bilateral temporal headaches in the afternoons.  She had previous history of migraines.  She is taking tramadol 50 mg daily as needed.  She reported some constipation 3 days after treatment. - Reviewed labs today which showed normal LFTs.  CBC was grossly normal with macrocytic anemia with hemoglobin 8.8. - Proceed with cycle 5 with 30% dose reduction. - RTC 2 weeks for follow-up.   2.  Mucositis: -She did not experience any mucositis after last cycle after dose reduction.   3.  Peripheral neuropathy: -Mild numbness in the legs after COVID-vaccine is stable.  No worsening noted.  4.  Electrolyte abnormalities: -She is not taking potassium.  Potassium today is normal at 3.6.  5.  Nausea: -Continue Zofran 8 mg every 8 hours as needed.  She did not require it after last cycle.   Orders placed this encounter:  No orders of the defined types were placed in this encounter.    Derek Jack, MD [facility] [phone]   I, Thana Ates, am acting as a scribe for Dr. Derek Jack.  Kinnie Scales MD,  have reviewed the above documentation for accuracy and completeness, and I agree with the above.

## 2021-10-26 ENCOUNTER — Inpatient Hospital Stay (HOSPITAL_BASED_OUTPATIENT_CLINIC_OR_DEPARTMENT_OTHER): Payer: 59 | Admitting: Hematology

## 2021-10-26 ENCOUNTER — Inpatient Hospital Stay (HOSPITAL_COMMUNITY): Payer: 59

## 2021-10-26 ENCOUNTER — Inpatient Hospital Stay (HOSPITAL_COMMUNITY): Payer: 59 | Attending: Hematology

## 2021-10-26 ENCOUNTER — Other Ambulatory Visit: Payer: Self-pay

## 2021-10-26 VITALS — BP 127/76 | HR 93 | Temp 98.3°F | Resp 18

## 2021-10-26 VITALS — BP 122/85 | HR 106 | Temp 96.7°F | Resp 18 | Ht 61.0 in | Wt 128.0 lb

## 2021-10-26 DIAGNOSIS — R110 Nausea: Secondary | ICD-10-CM | POA: Diagnosis not present

## 2021-10-26 DIAGNOSIS — Z87891 Personal history of nicotine dependence: Secondary | ICD-10-CM | POA: Insufficient documentation

## 2021-10-26 DIAGNOSIS — C187 Malignant neoplasm of sigmoid colon: Secondary | ICD-10-CM

## 2021-10-26 DIAGNOSIS — Z79899 Other long term (current) drug therapy: Secondary | ICD-10-CM | POA: Diagnosis not present

## 2021-10-26 DIAGNOSIS — C19 Malignant neoplasm of rectosigmoid junction: Secondary | ICD-10-CM | POA: Diagnosis present

## 2021-10-26 DIAGNOSIS — Z95828 Presence of other vascular implants and grafts: Secondary | ICD-10-CM

## 2021-10-26 DIAGNOSIS — Z5111 Encounter for antineoplastic chemotherapy: Secondary | ICD-10-CM | POA: Diagnosis not present

## 2021-10-26 DIAGNOSIS — C20 Malignant neoplasm of rectum: Secondary | ICD-10-CM

## 2021-10-26 LAB — CBC WITH DIFFERENTIAL/PLATELET
Abs Immature Granulocytes: 0.36 10*3/uL — ABNORMAL HIGH (ref 0.00–0.07)
Basophils Absolute: 0.1 10*3/uL (ref 0.0–0.1)
Basophils Relative: 1 %
Eosinophils Absolute: 0.2 10*3/uL (ref 0.0–0.5)
Eosinophils Relative: 2 %
HCT: 27.4 % — ABNORMAL LOW (ref 36.0–46.0)
Hemoglobin: 8.8 g/dL — ABNORMAL LOW (ref 12.0–15.0)
Immature Granulocytes: 3 %
Lymphocytes Relative: 16 %
Lymphs Abs: 1.7 10*3/uL (ref 0.7–4.0)
MCH: 35.1 pg — ABNORMAL HIGH (ref 26.0–34.0)
MCHC: 32.1 g/dL (ref 30.0–36.0)
MCV: 109.2 fL — ABNORMAL HIGH (ref 80.0–100.0)
Monocytes Absolute: 0.8 10*3/uL (ref 0.1–1.0)
Monocytes Relative: 8 %
Neutro Abs: 7.4 10*3/uL (ref 1.7–7.7)
Neutrophils Relative %: 70 %
Platelets: 201 10*3/uL (ref 150–400)
RBC: 2.51 MIL/uL — ABNORMAL LOW (ref 3.87–5.11)
RDW: 20.8 % — ABNORMAL HIGH (ref 11.5–15.5)
WBC: 10.6 10*3/uL — ABNORMAL HIGH (ref 4.0–10.5)
nRBC: 0 % (ref 0.0–0.2)

## 2021-10-26 LAB — COMPREHENSIVE METABOLIC PANEL
ALT: 27 U/L (ref 0–44)
AST: 17 U/L (ref 15–41)
Albumin: 3.3 g/dL — ABNORMAL LOW (ref 3.5–5.0)
Alkaline Phosphatase: 107 U/L (ref 38–126)
Anion gap: 7 (ref 5–15)
BUN: 15 mg/dL (ref 6–20)
CO2: 23 mmol/L (ref 22–32)
Calcium: 9.2 mg/dL (ref 8.9–10.3)
Chloride: 106 mmol/L (ref 98–111)
Creatinine, Ser: 0.76 mg/dL (ref 0.44–1.00)
GFR, Estimated: >60 mL/min (ref >60)
Glucose, Bld: 120 mg/dL — ABNORMAL HIGH (ref 70–99)
Potassium: 3.6 mmol/L (ref 3.5–5.1)
Sodium: 136 mmol/L (ref 135–145)
Total Bilirubin: 0.7 mg/dL (ref 0.3–1.2)
Total Protein: 6.6 g/dL (ref 6.5–8.1)

## 2021-10-26 LAB — MAGNESIUM: Magnesium: 1.9 mg/dL (ref 1.7–2.4)

## 2021-10-26 MED ORDER — DEXTROSE 5 % IV SOLN: Freq: Once | INTRAVENOUS | Status: AC

## 2021-10-26 MED ORDER — LEUCOVORIN CALCIUM INJECTION 350 MG
280.0000 mg/m2 | Freq: Once | INTRAVENOUS | Status: AC
  Administered 2021-10-26: 446 mg via INTRAVENOUS
  Filled 2021-10-26: qty 22.3

## 2021-10-26 MED ORDER — PALONOSETRON HCL INJECTION 0.25 MG/5ML
0.2500 mg | Freq: Once | INTRAVENOUS | Status: AC
  Administered 2021-10-26: 0.25 mg via INTRAVENOUS
  Filled 2021-10-26: qty 5

## 2021-10-26 MED ORDER — SODIUM CHLORIDE 0.9 % IV SOLN
1680.0000 mg/m2 | INTRAVENOUS | Status: DC
  Administered 2021-10-26: 2650 mg via INTRAVENOUS
  Filled 2021-10-26: qty 53

## 2021-10-26 MED ORDER — FLUOROURACIL CHEMO INJECTION 500 MG/10ML
280.0000 mg/m2 | Freq: Once | INTRAVENOUS | Status: AC
  Administered 2021-10-26: 450 mg via INTRAVENOUS
  Filled 2021-10-26: qty 9

## 2021-10-26 MED ORDER — TRAMADOL HCL 50 MG PO TABS: 50.0000 mg | ORAL_TABLET | Freq: Every day | ORAL | 0 refills | Status: DC | PRN

## 2021-10-26 MED ORDER — SODIUM CHLORIDE 0.9 % IV SOLN
10.0000 mg | Freq: Once | INTRAVENOUS | Status: AC
  Administered 2021-10-26: 10 mg via INTRAVENOUS
  Filled 2021-10-26: qty 10

## 2021-10-26 MED ORDER — OXALIPLATIN CHEMO INJECTION 100 MG/20ML
59.5000 mg/m2 | Freq: Once | INTRAVENOUS | Status: AC
  Administered 2021-10-26: 95 mg via INTRAVENOUS
  Filled 2021-10-26: qty 19

## 2021-10-26 NOTE — Patient Instructions (Signed)
[facility] at [facility] Discharge Instructions  You were seen and examined today by Dr. Delton Coombes. He reviewed your most recent labs and everything looks okay. He wants you to start taking your blood pressure in the evening when you have a headache. Please keep a journal of your blood pressure.   You received your treatment today.   Please keep follow up as scheduled.   Thank you for choosing [facility] at [facility] to provide your oncology and hematology care.  To afford each patient quality time with our provider, please arrive at least 15 minutes before your scheduled appointment time.   If you have a lab appointment with the [facility] please come in thru the Main Entrance and check in at the main information desk.  You need to re-schedule your appointment should you arrive 10 or more minutes late.  We strive to give you quality time with our providers, and arriving late affects you and other patients whose appointments are after yours.  Also, if you no show three or more times for appointments you may be dismissed from the clinic at the providers discretion.     Again, thank you for choosing [facility].  Our hope is that these requests will decrease the amount of time that you wait before being seen by our physicians.       _____________________________________________________________  Should you have questions after your visit to [facility], please contact our office at [phone] and follow the prompts.  Our office hours are 8:00 a.m. and 4:30 p.m. Monday - Friday.  Please note that voicemails left after 4:00 p.m. may not be returned until the following business day.  We are closed weekends and major holidays.  You do have access to a nurse 24-7, just call the main number to the clinic [phone] and do not press any options, hold on the line and a nurse will answer the phone.    For prescription refill  requests, have your pharmacy contact our office and allow 72 hours.    Due to Covid, you will need to wear a mask upon entering the hospital. If you do not have a mask, a mask will be given to you at the Main Entrance upon arrival. For doctor visits, patients may have 1 support person age 75 or older with them. For treatment visits, patients can not have anyone with them due to social distancing guidelines and our immunocompromised population.

## 2021-10-26 NOTE — Patient Instructions (Addendum)
[facility]  Discharge Instructions: Thank you for choosing [facility] to provide your oncology and hematology care.  If you have a lab appointment with the [facility], please come in thru the Main Entrance and check in at the main information desk.  Wear comfortable clothing and clothing appropriate for easy access to any Portacath or PICC line.   We strive to give you quality time with your provider. You may need to reschedule your appointment if you arrive late (15 or more minutes).  Arriving late affects you and other patients whose appointments are after yours.  Also, if you miss three or more appointments without notifying the office, you may be dismissed from the clinic at the provider's discretion.      For prescription refill requests, have your pharmacy contact our office and allow 72 hours for refills to be completed.    Today you received the following chemotherapy and/or immunotherapy agents FOLFOX  Return on Wednesday to have your pump removed  The chemotherapy medication bag should finish at 46 hours, 96 hours, or 7 days. For example, if your pump is scheduled for 46 hours and it was put on at 4:00 p.m., it should finish at 2:00 p.m. the day it is scheduled to come off regardless of your appointment time.     Estimated time to finish at 1230 pm.   If the display on your pump reads "Low Volume" and it is beeping, take the batteries out of the pump and come to the cancer center for it to be taken off.   If the pump alarms go off prior to the pump reading "Low Volume" then call [phone] and someone can assist you.  If the plunger comes out and the chemotherapy medication is leaking out, please use your home chemo spill kit to clean up the spill. Do NOT use paper towels or other household products.  If you have problems or questions regarding your pump, please call either [phone] (24 hours a day) or the cancer center Monday-Friday 8:00  a.m.- 4:30 p.m. at the clinic number and we will assist you. If you are unable to get assistance, then go to the nearest Emergency Department and ask the staff to contact the IV team for assistance.    BELOW ARE SYMPTOMS THAT SHOULD BE REPORTED IMMEDIATELY: *FEVER GREATER THAN 100.4 F (38 C) OR HIGHER *CHILLS OR SWEATING *NAUSEA AND VOMITING THAT IS NOT CONTROLLED WITH YOUR NAUSEA MEDICATION *UNUSUAL SHORTNESS OF BREATH *UNUSUAL BRUISING OR BLEEDING *URINARY PROBLEMS (pain or burning when urinating, or frequent urination) *BOWEL PROBLEMS (unusual diarrhea, constipation, pain near the anus) TENDERNESS IN MOUTH AND THROAT WITH OR WITHOUT PRESENCE OF ULCERS (sore throat, sores in mouth, or a toothache) UNUSUAL RASH, SWELLING OR PAIN  UNUSUAL VAGINAL DISCHARGE OR ITCHING   Items with * indicate a potential emergency and should be followed up as soon as possible or go to the Emergency Department if any problems should occur.  Please show the CHEMOTHERAPY ALERT CARD or IMMUNOTHERAPY ALERT CARD at check-in to the Emergency Department and triage nurse.  Should you have questions after your visit or need to cancel or reschedule your appointment, please contact [facility] [phone]  and follow the prompts.  Office hours are 8:00 a.m. to 4:30 p.m. Monday - Friday. Please note that voicemails left after 4:00 p.m. may not be returned until the following business day.  We are closed weekends and major holidays. You have access to a  nurse at all times for urgent questions. Please call the main number to the clinic [phone] and follow the prompts.  For any non-urgent questions, you may also contact your provider using MyChart. We now offer e-Visits for anyone 38 and older to request care online for non-urgent symptoms. For details visit [email or web address].   Also download the MyChart app! Go to the app store, search "MyChart", open the app, select [facility], and log in with your  MyChart username and password.  Due to Covid, a mask is required upon entering the hospital/clinic. If you do not have a mask, one will be given to you upon arrival. For doctor visits, patients may have 1 support person aged 8 or older with them. For treatment visits, patients cannot have anyone with them due to current Covid guidelines and our immunocompromised population.   Oxaliplatin Injection What is this medication? OXALIPLATIN (ox AL i PLA tin) is a chemotherapy drug. It targets fast dividing cells, like cancer cells, and causes these cells to die. This medicine is used to treat cancers of the colon and rectum, and many other cancers. This medicine may be used for other purposes; ask your health care provider or pharmacist if you have questions. COMMON BRAND NAME(S): Eloxatin What should I tell my care team before I take this medication? They need to know if you have any of these conditions: heart disease history of irregular heartbeat liver disease low blood counts, like white cells, platelets, or red blood cells lung or breathing disease, like asthma take medicines that treat or prevent blood clots tingling of the fingers or toes, or other nerve disorder an unusual or allergic reaction to oxaliplatin, other chemotherapy, other medicines, foods, dyes, or preservatives pregnant or trying to get pregnant breast-feeding How should I use this medication? This drug is given as an infusion into a vein. It is administered in a hospital or clinic by a specially trained health care professional. Talk to your pediatrician regarding the use of this medicine in children. Special care may be needed. Overdosage: If you think you have taken too much of this medicine contact a poison control center or emergency room at once. NOTE: This medicine is only for you. Do not share this medicine with others. What if I miss a dose? It is important not to miss a dose. Call your doctor or health care  professional if you are unable to keep an appointment. What may interact with this medication? Do not take this medicine with any of the following medications: cisapride dronedarone pimozide thioridazine This medicine may also interact with the following medications: aspirin and aspirin-like medicines certain medicines that treat or prevent blood clots like warfarin, apixaban, dabigatran, and rivaroxaban cisplatin cyclosporine diuretics medicines for infection like acyclovir, adefovir, amphotericin B, bacitracin, cidofovir, foscarnet, ganciclovir, gentamicin, pentamidine, vancomycin NSAIDs, medicines for pain and inflammation, like ibuprofen or naproxen other medicines that prolong the QT interval (an abnormal heart rhythm) pamidronate zoledronic acid This list may not describe all possible interactions. Give your health care provider a list of all the medicines, herbs, non-prescription drugs, or dietary supplements you use. Also tell them if you smoke, drink alcohol, or use illegal drugs. Some items may interact with your medicine. What should I watch for while using this medication? Your condition will be monitored carefully while you are receiving this medicine. You may need blood work done while you are taking this medicine. This medicine may make you feel generally unwell. This is not uncommon as chemotherapy can  affect healthy cells as well as cancer cells. Report any side effects. Continue your course of treatment even though you feel ill unless your healthcare professional tells you to stop. This medicine can make you more sensitive to cold. Do not drink cold drinks or use ice. Cover exposed skin before coming in contact with cold temperatures or cold objects. When out in cold weather wear warm clothing and cover your mouth and nose to warm the air that goes into your lungs. Tell your doctor if you get sensitive to the cold. Do not become pregnant while taking this medicine or for 9  months after stopping it. Women should inform their health care professional if they wish to become pregnant or think they might be pregnant. Men should not father a child while taking this medicine and for 6 months after stopping it. There is potential for serious side effects to an unborn child. Talk to your health care professional for more information. Do not breast-feed a child while taking this medicine or for 3 months after stopping it. This medicine has caused ovarian failure in some women. This medicine may make it more difficult to get pregnant. Talk to your health care professional if you are concerned about your fertility. This medicine has caused decreased sperm counts in some men. This may make it more difficult to father a child. Talk to your health care professional if you are concerned about your fertility. This medicine may increase your risk of getting an infection. Call your health care professional for advice if you get a fever, chills, or sore throat, or other symptoms of a cold or flu. Do not treat yourself. Try to avoid being around people who are sick. Avoid taking medicines that contain aspirin, acetaminophen, ibuprofen, naproxen, or ketoprofen unless instructed by your health care professional. These medicines may hide a fever. Be careful brushing or flossing your teeth or using a toothpick because you may get an infection or bleed more easily. If you have any dental work done, tell your dentist you are receiving this medicine. What side effects may I notice from receiving this medication? Side effects that you should report to your doctor or health care professional as soon as possible: allergic reactions like skin rash, itching or hives, swelling of the face, lips, or tongue breathing problems cough low blood counts - this medicine may decrease the number of white blood cells, red blood cells, and platelets. You may be at increased risk for infections and bleeding nausea,  vomiting pain, redness, or irritation at site where injected pain, tingling, numbness in the hands or feet signs and symptoms of bleeding such as bloody or black, tarry stools; red or dark brown urine; spitting up blood or brown material that looks like coffee grounds; red spots on the skin; unusual bruising or bleeding from the eyes, gums, or nose signs and symptoms of a dangerous change in heartbeat or heart rhythm like chest pain; dizziness; fast, irregular heartbeat; palpitations; feeling faint or lightheaded; falls signs and symptoms of infection like fever; chills; cough; sore throat; pain or trouble passing urine signs and symptoms of liver injury like dark yellow or brown urine; general ill feeling or flu-like symptoms; light-colored stools; loss of appetite; nausea; right upper belly pain; unusually weak or tired; yellowing of the eyes or skin signs and symptoms of low red blood cells or anemia such as unusually weak or tired; feeling faint or lightheaded; falls signs and symptoms of muscle injury like dark urine; trouble passing  urine or change in the amount of urine; unusually weak or tired; muscle pain; back pain Side effects that usually do not require medical attention (report to your doctor or health care professional if they continue or are bothersome): changes in taste diarrhea gas hair loss loss of appetite mouth sores This list may not describe all possible side effects. Call your doctor for medical advice about side effects. You may report side effects to FDA at 1-800-FDA-1088. Where should I keep my medication? This drug is given in a hospital or clinic and will not be stored at home. NOTE: This sheet is a summary. It may not cover all possible information. If you have questions about this medicine, talk to your doctor, pharmacist, or health care provider.  2022 Elsevier/Gold Standard (2021-07-28 00:00:00)  Leucovorin injection What is this medication? LEUCOVORIN (loo  koe VOR in) is used to prevent or treat the harmful effects of some medicines. This medicine is used to treat anemia caused by a low amount of folic acid in the body. It is also used with 5-fluorouracil (5-FU) to treat colon cancer. This medicine may be used for other purposes; ask your health care provider or pharmacist if you have questions. What should I tell my care team before I take this medication? They need to know if you have any of these conditions: anemia from low levels of vitamin B-12 in the blood an unusual or allergic reaction to leucovorin, folic acid, other medicines, foods, dyes, or preservatives pregnant or trying to get pregnant breast-feeding How should I use this medication? This medicine is for injection into a muscle or into a vein. It is given by a health care professional in a hospital or clinic setting. Talk to your pediatrician regarding the use of this medicine in children. Special care may be needed. Overdosage: If you think you have taken too much of this medicine contact a poison control center or emergency room at once. NOTE: This medicine is only for you. Do not share this medicine with others. What if I miss a dose? This does not apply. What may interact with this medication? capecitabine fluorouracil phenobarbital phenytoin primidone trimethoprim-sulfamethoxazole This list may not describe all possible interactions. Give your health care provider a list of all the medicines, herbs, non-prescription drugs, or dietary supplements you use. Also tell them if you smoke, drink alcohol, or use illegal drugs. Some items may interact with your medicine. What should I watch for while using this medication? Your condition will be monitored carefully while you are receiving this medicine. This medicine may increase the side effects of 5-fluorouracil, 5-FU. Tell your doctor or health care professional if you have diarrhea or mouth sores that do not get better or that  get worse. What side effects may I notice from receiving this medication? Side effects that you should report to your doctor or health care professional as soon as possible: allergic reactions like skin rash, itching or hives, swelling of the face, lips, or tongue breathing problems fever, infection mouth sores unusual bleeding or bruising unusually weak or tired Side effects that usually do not require medical attention (report to your doctor or health care professional if they continue or are bothersome): constipation or diarrhea loss of appetite nausea, vomiting This list may not describe all possible side effects. Call your doctor for medical advice about side effects. You may report side effects to FDA at 1-800-FDA-1088. Where should I keep my medication? This drug is given in a hospital or clinic  and will not be stored at home. NOTE: This sheet is a summary. It may not cover all possible information. If you have questions about this medicine, talk to your doctor, pharmacist, or health care provider.  2022 Elsevier/Gold Standard (2008-05-16 00:00:00)   Fluorouracil, 5-FU injection What is this medication? FLUOROURACIL, 5-FU (flure oh YOOR a sil) is a chemotherapy drug. It slows the growth of cancer cells. This medicine is used to treat many types of cancer like breast cancer, colon or rectal cancer, pancreatic cancer, and stomach cancer. This medicine may be used for other purposes; ask your health care provider or pharmacist if you have questions. COMMON BRAND NAME(S): Adrucil What should I tell my care team before I take this medication? They need to know if you have any of these conditions: blood disorders dihydropyrimidine dehydrogenase (DPD) deficiency infection (especially a virus infection such as chickenpox, cold sores, or herpes) kidney disease liver disease malnourished, poor nutrition recent or ongoing radiation therapy an unusual or allergic reaction to  fluorouracil, other chemotherapy, other medicines, foods, dyes, or preservatives pregnant or trying to get pregnant breast-feeding How should I use this medication? This drug is given as an infusion or injection into a vein. It is administered in a hospital or clinic by a specially trained health care professional. Talk to your pediatrician regarding the use of this medicine in children. Special care may be needed. Overdosage: If you think you have taken too much of this medicine contact a poison control center or emergency room at once. NOTE: This medicine is only for you. Do not share this medicine with others. What if I miss a dose? It is important not to miss your dose. Call your doctor or health care professional if you are unable to keep an appointment. What may interact with this medication? Do not take this medicine with any of the following medications: live virus vaccines This medicine may also interact with the following medications: medicines that treat or prevent blood clots like warfarin, enoxaparin, and dalteparin This list may not describe all possible interactions. Give your health care provider a list of all the medicines, herbs, non-prescription drugs, or dietary supplements you use. Also tell them if you smoke, drink alcohol, or use illegal drugs. Some items may interact with your medicine. What should I watch for while using this medication? Visit your doctor for checks on your progress. This drug may make you feel generally unwell. This is not uncommon, as chemotherapy can affect healthy cells as well as cancer cells. Report any side effects. Continue your course of treatment even though you feel ill unless your doctor tells you to stop. In some cases, you may be given additional medicines to help with side effects. Follow all directions for their use. Call your doctor or health care professional for advice if you get a fever, chills or sore throat, or other symptoms of a  cold or flu. Do not treat yourself. This drug decreases your body's ability to fight infections. Try to avoid being around people who are sick. This medicine may increase your risk to bruise or bleed. Call your doctor or health care professional if you notice any unusual bleeding. Be careful brushing and flossing your teeth or using a toothpick because you may get an infection or bleed more easily. If you have any dental work done, tell your dentist you are receiving this medicine. Avoid taking products that contain aspirin, acetaminophen, ibuprofen, naproxen, or ketoprofen unless instructed by your doctor. These medicines may  hide a fever. Do not become pregnant while taking this medicine. Women should inform their doctor if they wish to become pregnant or think they might be pregnant. There is a potential for serious side effects to an unborn child. Talk to your health care professional or pharmacist for more information. Do not breast-feed an infant while taking this medicine. Men should inform their doctor if they wish to father a child. This medicine may lower sperm counts. Do not treat diarrhea with over the counter products. Contact your doctor if you have diarrhea that lasts more than 2 days or if it is severe and watery. This medicine can make you more sensitive to the sun. Keep out of the sun. If you cannot avoid being in the sun, wear protective clothing and use sunscreen. Do not use sun lamps or tanning beds/booths. What side effects may I notice from receiving this medication? Side effects that you should report to your doctor or health care professional as soon as possible: allergic reactions like skin rash, itching or hives, swelling of the face, lips, or tongue low blood counts - this medicine may decrease the number of white blood cells, red blood cells and platelets. You may be at increased risk for infections and bleeding. signs of infection - fever or chills, cough, sore throat, pain  or difficulty passing urine signs of decreased platelets or bleeding - bruising, pinpoint red spots on the skin, black, tarry stools, blood in the urine signs of decreased red blood cells - unusually weak or tired, fainting spells, lightheadedness breathing problems changes in vision chest pain mouth sores nausea and vomiting pain, swelling, redness at site where injected pain, tingling, numbness in the hands or feet redness, swelling, or sores on hands or feet stomach pain unusual bleeding Side effects that usually do not require medical attention (report to your doctor or health care professional if they continue or are bothersome): changes in finger or toe nails diarrhea dry or itchy skin hair loss headache loss of appetite sensitivity of eyes to the light stomach upset unusually teary eyes This list may not describe all possible side effects. Call your doctor for medical advice about side effects. You may report side effects to FDA at 1-800-FDA-1088. Where should I keep my medication? This drug is given in a hospital or clinic and will not be stored at home. NOTE: This sheet is a summary. It may not cover all possible information. If you have questions about this medicine, talk to your doctor, pharmacist, or health care provider.  2022 Elsevier/Gold Standard (2021-07-28 00:00:00)

## 2021-10-26 NOTE — Progress Notes (Signed)
Pt here for FOLFOX.  Vital signs and labs WNL for treatment.   Okay to proceed with treatment 30% dose reduction today per Dr. Raliegh Ip.  Tolerated treatment well today without incidence.  Vital signs stable prior to discharge.  AVS reviewed.  Stable during and after treatment.  Discharged in stable condition ambulatory with ambulatory 57fu pump connected and running.

## 2021-10-26 NOTE — Progress Notes (Signed)
Patient has been assessed, vital signs and labs have been reviewed by Dr. Delton Coombes. ANC, Creatinine, LFTs, and Platelets are within treatment parameters per Dr. Delton Coombes. The patient is good to proceed with treatment with 30% dose reduction at this time.  Primary RN and pharmacy aware.

## 2021-10-28 ENCOUNTER — Encounter (HOSPITAL_COMMUNITY): Payer: Self-pay

## 2021-10-28 ENCOUNTER — Other Ambulatory Visit: Payer: Self-pay

## 2021-10-28 ENCOUNTER — Inpatient Hospital Stay (HOSPITAL_COMMUNITY): Payer: 59

## 2021-10-28 VITALS — BP 123/78 | HR 100 | Temp 98.1°F | Resp 18

## 2021-10-28 DIAGNOSIS — Z95828 Presence of other vascular implants and grafts: Secondary | ICD-10-CM

## 2021-10-28 DIAGNOSIS — C20 Malignant neoplasm of rectum: Secondary | ICD-10-CM

## 2021-10-28 DIAGNOSIS — Z5111 Encounter for antineoplastic chemotherapy: Secondary | ICD-10-CM | POA: Diagnosis not present

## 2021-10-28 MED ORDER — HEPARIN SOD (PORK) LOCK FLUSH 100 UNIT/ML IV SOLN
500.0000 [IU] | Freq: Once | INTRAVENOUS | Status: AC | PRN
  Administered 2021-10-28: 500 [IU]

## 2021-10-28 MED ORDER — SODIUM CHLORIDE 0.9% FLUSH
10.0000 mL | INTRAVENOUS | Status: DC | PRN
  Administered 2021-10-28: 10 mL

## 2021-10-28 MED ORDER — PEGFILGRASTIM-CBQV 6 MG/0.6ML ~~LOC~~ SOSY
6.0000 mg | PREFILLED_SYRINGE | Freq: Once | SUBCUTANEOUS | Status: AC
  Administered 2021-10-28: 6 mg via SUBCUTANEOUS
  Filled 2021-10-28: qty 0.6

## 2021-10-28 NOTE — Patient Instructions (Signed)
[facility]  Discharge Instructions: Thank you for choosing [facility] to provide your oncology and hematology care.  If you have a lab appointment with the [facility], please come in thru the Main Entrance and check in at the main information desk.  Wear comfortable clothing and clothing appropriate for easy access to any Portacath or PICC line.   We strive to give you quality time with your provider. You may need to reschedule your appointment if you arrive late (15 or more minutes).  Arriving late affects you and other patients whose appointments are after yours.  Also, if you miss three or more appointments without notifying the office, you may be dismissed from the clinic at the provider's discretion.      For prescription refill requests, have your pharmacy contact our office and allow 72 hours for refills to be completed.    Today you received the following chemotherapy and/or immunotherapy agents ambulatory 65fu pump removed, port de-accessed      To help prevent nausea and vomiting after your treatment, we encourage you to take your nausea medication as directed.  BELOW ARE SYMPTOMS THAT SHOULD BE REPORTED IMMEDIATELY: *FEVER GREATER THAN 100.4 F (38 C) OR HIGHER *CHILLS OR SWEATING *NAUSEA AND VOMITING THAT IS NOT CONTROLLED WITH YOUR NAUSEA MEDICATION *UNUSUAL SHORTNESS OF BREATH *UNUSUAL BRUISING OR BLEEDING *URINARY PROBLEMS (pain or burning when urinating, or frequent urination) *BOWEL PROBLEMS (unusual diarrhea, constipation, pain near the anus) TENDERNESS IN MOUTH AND THROAT WITH OR WITHOUT PRESENCE OF ULCERS (sore throat, sores in mouth, or a toothache) UNUSUAL RASH, SWELLING OR PAIN  UNUSUAL VAGINAL DISCHARGE OR ITCHING   Items with * indicate a potential emergency and should be followed up as soon as possible or go to the Emergency Department if any problems should occur.  Please show the CHEMOTHERAPY ALERT CARD or IMMUNOTHERAPY ALERT CARD  at check-in to the Emergency Department and triage nurse.  Should you have questions after your visit or need to cancel or reschedule your appointment, please contact [facility] [phone]  and follow the prompts.  Office hours are 8:00 a.m. to 4:30 p.m. Monday - Friday. Please note that voicemails left after 4:00 p.m. may not be returned until the following business day.  We are closed weekends and major holidays. You have access to a nurse at all times for urgent questions. Please call the main number to the clinic [phone] and follow the prompts.  For any non-urgent questions, you may also contact your provider using MyChart. We now offer e-Visits for anyone 65 and older to request care online for non-urgent symptoms. For details visit [email or web address].   Also download the MyChart app! Go to the app store, search "MyChart", open the app, select [facility], and log in with your MyChart username and password.  Due to Covid, a mask is required upon entering the hospital/clinic. If you do not have a mask, one will be given to you upon arrival. For doctor visits, patients may have 1 support person aged 30 or older with them. For treatment visits, patients cannot have anyone with them due to current Covid guidelines and our immunocompromised population.

## 2021-10-28 NOTE — Progress Notes (Signed)
Alexandra Bright presented for Portacath de-access and flush.  Portacath located right chest wall. Good blood return present. Portacath flushed with 56ml NS and 500U/49ml Heparin and needle removed intact.  Procedure tolerated well and without incident.   Ambulatory 42fu pump disconnected.  Vital signs stable.  AVS reviewed.  Pt stable during and after procedure.  Discharged in stable condition ambulatory.   Alexandra Bright presents today for injection per the provider's orders.  Udencya in abdomen subcutaneous administration without incident; injection site WNL; see MAR for injection details.  Patient tolerated procedure well and without incident.  No questions or complaints noted at this time.

## 2021-11-08 NOTE — Progress Notes (Signed)
[place], [place] [address]   CLINIC:  Medical Oncology/Hematology  PCP:  Lindell Spar, MD [address] / [place] [address] [phone]   REASON FOR VISIT:  Follow-up for sigmoid adenocarcinoma  PRIOR THERAPY: none  NGS Results: not done  CURRENT THERAPY: FOLFOX every 2 weeks x 4 months  BRIEF ONCOLOGIC HISTORY:  Oncology History  Rectal cancer ([facility])  04/08/2021 Initial Diagnosis   Rectal cancer ([facility])   04/20/2021 - 04/20/2021 Chemotherapy          08/18/2021 -  Chemotherapy   Patient is on Treatment Plan : COLORECTAL FOLFOX q14d x 4 months       CANCER STAGING:  Cancer Staging  Rectal cancer ([facility]) Staging form: Colon and Rectum, AJCC 8th Edition - Clinical stage from 04/08/2021: Stage IIIB (cT3, cN1b, cM0) - Unsigned   INTERVAL HISTORY:  [name], a 55 y.o. female, returns for routine follow-up and consideration for next cycle of chemotherapy. [name] was last seen on 10/26/2021.  Due for cycle #6 of FOLFOX  today.   Overall, she tells me she has been feeling pretty well. She denies nausea, and she has not required Zofran. She has not had headaches or numbness in her legs for the past 2 weeks. She reports 1 mouth sore. She takes Tramadol prn for headaches. She denies abdominal pain, ankle swellings, and constipation. She reports pain with BM and hard BM for 1 week following treatment; she is taking 1 Senokot-S daily. She reports blood in her mucous when blowing her nose.  Overall, she feels ready for next cycle of chemo today.   REVIEW OF SYSTEMS:  Review of Systems  Constitutional:  Negative for appetite change and fatigue.  HENT:   Positive for mouth sores and nosebleeds.   Respiratory:  Positive for shortness of breath.   Cardiovascular:  Negative for leg swelling.  Gastrointestinal:  Negative for abdominal pain and constipation.  Neurological:  Positive for dizziness. Negative for headaches (resolved)  and numbness (resolved).  All other systems reviewed and are negative.  PAST MEDICAL/SURGICAL HISTORY:  Past Medical History:  Diagnosis Date   Anxiety    Colon cancer ([facility]) 01/2021   Constipation    GERD (gastroesophageal reflux disease)    Hypertension    Port-A-Cath in place 08/17/2021   Pre-diabetes    Past Surgical History:  Procedure Laterality Date   BIOPSY  02/18/2021   Procedure: BIOPSY;  Surgeon: Harvel Quale, MD;  Location[facility];  Service: Gastroenterology;;   FACIAL FRACTURE SURGERY  1991   from [place]  02/18/2021   Procedure: FLEXIBLE SIGMOIDOSCOPY;  Surgeon: Harvel Quale, MD;  Location: [facility];  Service: Gastroenterology;;   IR IMAGING GUIDED PORT INSERTION  08/13/2021   POLYPECTOMY  02/18/2021   Procedure: POLYPECTOMY INTESTINAL;  Surgeon: Harvel Quale, MD;  Location: [facility];  Service: Gastroenterology;;   SUBMUCOSAL TATTOO INJECTION  02/18/2021   Procedure: SUBMUCOSAL TATTOO INJECTION;  Surgeon: Harvel Quale, MD;  Location: [facility];  Service: Gastroenterology;;   XI ROBOTIC ASSISTED LOWER ANTERIOR RESECTION N/A 07/10/2021   Procedure: XI ROBOTIC ASSISTED LOWER ANTERIOR RESECTION;  Surgeon: Leighton Ruff, MD;  Location: [facility];  Service: General;  Laterality: N/A;    SOCIAL HISTORY:  Social History   Socioeconomic History   Marital status: Married    Spouse name: Not on file   Number of children: Not on  file   Years of education: Not on file   Highest education level: Not on file  Occupational History   Not on file  Tobacco Use   Smoking status: Former    Packs/day: 0.50    Years: 40.00    Pack years: 20.00    Types: Cigarettes    Quit date: 2020    Years since quitting: 2.9   Smokeless tobacco: Never  Vaping Use   Vaping Use: Never used  Substance and Sexual Activity   Alcohol use: Not Currently   Drug use: Never   Sexual activity: Not Currently   Other Topics Concern   Not on file  Social History Narrative   Not on file   Social Determinants of Health   Financial Resource Strain: Low Risk    Difficulty of Paying Living Expenses: Not hard at all  Food Insecurity: No Food Insecurity   Worried About Charity fundraiser in the Last Year: Never true   [place] in the Last Year: Never true  Transportation Needs: No Transportation Needs   Lack of Transportation (Medical): No   Lack of Transportation (Non-Medical): No  Physical Activity: Insufficiently Active   Days of Exercise per Week: 3 days   Minutes of Exercise per Session: 20 min  Stress: No Stress Concern Present   Feeling of Stress : Only a little  Social Connections: Engineer, building services of Communication with Friends and Family: More than three times a week   Frequency of Social Gatherings with Friends and Family: More than three times a week   Attends Religious Services: More than 4 times per year   Active Member of [employer] or Organizations: Yes   Attends Music therapist: More than 4 times per year   Marital Status: Married  Human resources officer Violence: Not At Risk   Fear of Current or Ex-Partner: No   Emotionally Abused: No   Physically Abused: No   Sexually Abused: No    FAMILY HISTORY:  Family History  Problem Relation Age of Onset   Diabetes Sister    Cancer Maternal Aunt    Ovarian cancer Cousin    Ovarian cancer Paternal Aunt    Prostate cancer Cousin     CURRENT MEDICATIONS:  Current Outpatient Medications  Medication Sig Dispense Refill   benzocaine (ORAJEL) 10 % mucosal gel Use as directed 1 application in the mouth or throat as needed for mouth pain.     buPROPion (WELLBUTRIN SR) 200 MG 12 hr tablet Take 1 tablet (200 mg total) by mouth 2 (two) times daily. 60 tablet 5   famotidine (PEPCID) 20 MG tablet Take 1 tablet (20 mg total) by mouth 2 (two) times daily. 30 tablet 0   fluorouracil CALGB 56256 2,400 mg/m2 in  sodium chloride 0.9 % 150 mL Inject 2,400 mg/m2 into the vein over 48 hr.     FLUOROURACIL IV Inject into the vein every 14 (fourteen) days.     LEUCOVORIN CALCIUM IV Inject into the vein every 14 (fourteen) days.     lidocaine-prilocaine (EMLA) cream Apply a small amount to port a cath site and cover with plastic wrap 1 hour prior to chemotherapy appointments 30 g 3   loperamide (IMODIUM) 1 MG/5ML solution Take 2 mg by mouth as needed for diarrhea or loose stools.     losartan (COZAAR) 50 MG tablet Take 1 tablet by mouth once daily (Patient taking differently: Take 50 mg by mouth daily.) 90 tablet  0   Multiple Vitamins-Minerals (MULTIVITAMIN WITH MINERALS) tablet Take 1 tablet by mouth in the morning. Women's Multivitamin     OXALIPLATIN IV Inject into the vein every 14 (fourteen) days.     potassium chloride SA (KLOR-CON) 20 MEQ tablet Take 1 tablet (20 mEq total) by mouth daily. 30 tablet 3   sennosides-docusate sodium (SENOKOT-S) 8.6-50 MG tablet Take 1 tablet by mouth daily as needed for constipation.     simethicone (MYLICON) 80 MG chewable tablet Chew 80 mg by mouth every 6 (six) hours as needed for flatulence.     sucralfate (CARAFATE) 1 g tablet Take 1 tablet (1 g total) by mouth 4 (four) times daily -  with meals and at bedtime. 21 tablet 0   traMADol (ULTRAM) 50 MG tablet Take 1 tablet (50 mg total) by mouth daily as needed. 60 tablet 0   dicyclomine (BENTYL) 10 MG capsule Take 10 mg by mouth 2 (two) times daily as needed for spasms. (Patient not taking: Reported on 11/09/2021)     diphenhydrAMINE (BENADRYL) 25 MG tablet Take 25 mg by mouth every 6 (six) hours as needed for allergies. (Patient not taking: Reported on 11/09/2021)     ondansetron (ZOFRAN) 8 MG tablet Take 1 tablet (8 mg total) by mouth every 8 (eight) hours as needed for nausea or vomiting. (Patient not taking: Reported on 11/09/2021) 20 tablet 2   prochlorperazine (COMPAZINE) 10 MG tablet Take 1 tablet (10 mg total) by  mouth every 6 (six) hours as needed (Nausea or vomiting). (Patient not taking: Reported on 11/09/2021) 30 tablet 1   No current facility-administered medications for this visit.   Facility-Administered Medications Ordered in Other Visits  Medication Dose Route Frequency Provider Last Rate Last Admin   sodium chloride flush (NS) 0.9 % injection 10 mL  10 mL Intracatheter PRN Derek Jack, MD   10 mL at 09/03/21 1337    ALLERGIES:  No Known Allergies  PHYSICAL EXAM:  Performance status (ECOG): 1 - Symptomatic but completely ambulatory  Vitals:   11/09/21 0843  BP: (!) 139/92  Pulse: 100  Resp: 18  Temp: 98 F (36.7 C)  SpO2: 99%   Wt Readings from Last 3 Encounters:  11/09/21 131 lb 9.6 oz (59.7 kg)  10/26/21 128 lb (58.1 kg)  09/29/21 124 lb 6.4 oz (56.4 kg)   Physical Exam Vitals reviewed.  Constitutional:      Appearance: Normal appearance.  HENT:     Mouth/Throat:     Mouth: No oral lesions.  Cardiovascular:     Rate and Rhythm: Normal rate and regular rhythm.     Pulses: Normal pulses.     Heart sounds: Normal heart sounds.  Pulmonary:     Effort: Pulmonary effort is normal.     Breath sounds: Normal breath sounds.  Neurological:     General: No focal deficit present.     Mental Status: She is alert and oriented to person, place, and time.  Psychiatric:        Mood and Affect: Mood normal.        Behavior: Behavior normal.    LABORATORY DATA:  I have reviewed the labs as listed.  CBC Latest Ref Rng & Units 11/09/2021 10/26/2021 10/12/2021  WBC 4.0 - 10.5 K/uL 8.8 10.6(H) 10.4  Hemoglobin 12.0 - 15.0 g/dL 9.5(L) 8.8(L) 9.6(L)  Hematocrit 36.0 - 46.0 % 30.1(L) 27.4(L) 30.0(L)  Platelets 150 - 400 K/uL 185 201 326   CMP Latest Ref Rng &  Units 10/26/2021 10/12/2021 09/29/2021  Glucose 70 - 99 mg/dL 120(H) 133(H) 132(H)  BUN 6 - 20 mg/dL 15 11 9   Creatinine 0.44 - 1.00 mg/dL 0.76 0.71 0.83  Sodium 135 - 145 mmol/L 136 136 135  Potassium 3.5 - 5.1  mmol/L 3.6 3.8 2.5(LL)  Chloride 98 - 111 mmol/L 106 106 103  CO2 22 - 32 mmol/L 23 23 24   Calcium 8.9 - 10.3 mg/dL 9.2 8.9 8.4(L)  Total Protein 6.5 - 8.1 g/dL 6.6 6.5 6.1(L)  Total Bilirubin 0.3 - 1.2 mg/dL 0.7 0.4 0.4  Alkaline Phos 38 - 126 U/L 107 80 80  AST 15 - 41 U/L 17 19 22   ALT 0 - 44 U/L 27 18 19     DIAGNOSTIC IMAGING:  I have independently reviewed the scans and discussed with the patient. No results found.   ASSESSMENT:  1.  Rectosigmoid sigmoid adenocarcinoma: - Presentation with the bleeding per rectum for the last 5 to 6 months and constipation. - Colonoscopy on 02/18/2021 showed ulcerated partially obstructing mass found from 12-17 cm from the anal verge, circumferential measuring 5 cm in length. - Biopsy consistent with adenocarcinoma. - CT CAP on 03/06/2021 with circumferential wall thickening, hyperenhancement and fat stranding about the rectosigmoid junction, mass measuring approximately 6 cm in length.  No evidence of lymphadenopathy or metastatic disease. - CEA on 02/19/2021 was 1.8. - MRI of the pelvis on 04/06/2021 shows high rectal T3c/T4AN1 malignancy. - Chemoradiation therapy with Xeloda from 04/22/2021 through 06/01/2021. - CT CAP on 06/29/2021 showed improvement in the rectosigmoid thickening at the site of malignancy.  No clear adenopathy.  No evidence of metastatic disease. - Robotic assisted LAR on 07/10/2021 with pathology showing 2 cm grade 2, margins negative, 1/18 lymph nodes positive, no tumor deposits, positive treatment effect, no perineural invasion, no lymphovascular invasion, [id number], MMR preserved - She will need completion colonoscopy after adjuvant therapy.   2.  Social/family history: - She worked as a Secretary/administrator and recently stopped working.  She quit smoking in October 2021, smoked 1 pack/day for 40 years. - Maternal aunt had lung cancer and was a non-smoker.  Paternal aunt had cancer, type not known to the patient.   PLAN:  1.  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 - She had constipation for 1 week.  She was told to increase Senokot S2 2 tablets daily. - Reviewed labs today which showed normal LFTs.  CBC shows normal white count and platelet count with anemia at 9.5. - Proceed with cycle 6 today with 70% dose. - RTC 2 weeks for follow-up.   2.  Mucositis: - Did not experience any mucositis after last cycle.   3.  Peripheral neuropathy: - She had mild numbness in the legs on and off after COVID vaccine.  She does not report any for the last 2 weeks.  4.  Electrolyte abnormalities: - Potassium today is 3.3.  She stopped taking potassium.  If it continues to be low, will restart back on potassium.  5.  Nausea: - Continue Zofran 8 mg every 8 hours as needed.   Orders placed this encounter:  No orders of the defined types were placed in this encounter.    Derek Jack, MD [facility] [phone]   I, Thana Ates, am acting as a scribe for Dr. Derek Jack.  I, Derek Jack MD, have reviewed the above documentation for accuracy and completeness, and I agree with the above.

## 2021-11-09 ENCOUNTER — Inpatient Hospital Stay (HOSPITAL_COMMUNITY): Payer: 59

## 2021-11-09 ENCOUNTER — Other Ambulatory Visit: Payer: Self-pay

## 2021-11-09 ENCOUNTER — Inpatient Hospital Stay (HOSPITAL_BASED_OUTPATIENT_CLINIC_OR_DEPARTMENT_OTHER): Payer: 59 | Admitting: Hematology

## 2021-11-09 VITALS — BP 139/92 | HR 100 | Temp 98.0°F | Resp 18 | Ht 61.0 in | Wt 131.6 lb

## 2021-11-09 VITALS — BP 146/91 | HR 88 | Temp 98.0°F | Resp 18

## 2021-11-09 DIAGNOSIS — C187 Malignant neoplasm of sigmoid colon: Secondary | ICD-10-CM

## 2021-11-09 DIAGNOSIS — Z5111 Encounter for antineoplastic chemotherapy: Secondary | ICD-10-CM | POA: Diagnosis not present

## 2021-11-09 DIAGNOSIS — C20 Malignant neoplasm of rectum: Secondary | ICD-10-CM

## 2021-11-09 DIAGNOSIS — Z95828 Presence of other vascular implants and grafts: Secondary | ICD-10-CM

## 2021-11-09 LAB — CBC WITH DIFFERENTIAL/PLATELET
Abs Immature Granulocytes: 0.24 10*3/uL — ABNORMAL HIGH (ref 0.00–0.07)
Basophils Absolute: 0 10*3/uL (ref 0.0–0.1)
Basophils Relative: 1 %
Eosinophils Absolute: 0.2 10*3/uL (ref 0.0–0.5)
Eosinophils Relative: 2 %
HCT: 30.1 % — ABNORMAL LOW (ref 36.0–46.0)
Hemoglobin: 9.5 g/dL — ABNORMAL LOW (ref 12.0–15.0)
Immature Granulocytes: 3 %
Lymphocytes Relative: 14 %
Lymphs Abs: 1.3 10*3/uL (ref 0.7–4.0)
MCH: 35.2 pg — ABNORMAL HIGH (ref 26.0–34.0)
MCHC: 31.6 g/dL (ref 30.0–36.0)
MCV: 111.5 fL — ABNORMAL HIGH (ref 80.0–100.0)
Monocytes Absolute: 0.8 10*3/uL (ref 0.1–1.0)
Monocytes Relative: 9 %
Neutro Abs: 6.3 10*3/uL (ref 1.7–7.7)
Neutrophils Relative %: 71 %
Platelets: 185 10*3/uL (ref 150–400)
RBC: 2.7 MIL/uL — ABNORMAL LOW (ref 3.87–5.11)
RDW: 20.1 % — ABNORMAL HIGH (ref 11.5–15.5)
WBC: 8.8 10*3/uL (ref 4.0–10.5)
nRBC: 0 % (ref 0.0–0.2)

## 2021-11-09 LAB — COMPREHENSIVE METABOLIC PANEL
ALT: 24 U/L (ref 0–44)
AST: 20 U/L (ref 15–41)
Albumin: 3.5 g/dL (ref 3.5–5.0)
Alkaline Phosphatase: 113 U/L (ref 38–126)
Anion gap: 9 (ref 5–15)
BUN: 7 mg/dL (ref 6–20)
CO2: 24 mmol/L (ref 22–32)
Calcium: 9.1 mg/dL (ref 8.9–10.3)
Chloride: 104 mmol/L (ref 98–111)
Creatinine, Ser: 0.7 mg/dL (ref 0.44–1.00)
GFR, Estimated: >60 mL/min (ref >60)
Glucose, Bld: 117 mg/dL — ABNORMAL HIGH (ref 70–99)
Potassium: 3.3 mmol/L — ABNORMAL LOW (ref 3.5–5.1)
Sodium: 137 mmol/L (ref 135–145)
Total Bilirubin: 0.1 mg/dL — ABNORMAL LOW (ref 0.3–1.2)
Total Protein: 6.7 g/dL (ref 6.5–8.1)

## 2021-11-09 LAB — MAGNESIUM: Magnesium: 1.7 mg/dL (ref 1.7–2.4)

## 2021-11-09 MED ORDER — SODIUM CHLORIDE 0.9 % IV SOLN
10.0000 mg | Freq: Once | INTRAVENOUS | Status: AC
  Administered 2021-11-09: 10:00:00 10 mg via INTRAVENOUS
  Filled 2021-11-09: qty 10

## 2021-11-09 MED ORDER — DEXTROSE 5 % IV SOLN: Freq: Once | INTRAVENOUS | Status: AC

## 2021-11-09 MED ORDER — PALONOSETRON HCL INJECTION 0.25 MG/5ML
0.2500 mg | Freq: Once | INTRAVENOUS | Status: AC
  Administered 2021-11-09: 10:00:00 0.25 mg via INTRAVENOUS
  Filled 2021-11-09: qty 5

## 2021-11-09 MED ORDER — LEUCOVORIN CALCIUM INJECTION 350 MG
280.0000 mg/m2 | Freq: Once | INTRAVENOUS | Status: AC
  Administered 2021-11-09: 11:00:00 446 mg via INTRAVENOUS
  Filled 2021-11-09: qty 22.3

## 2021-11-09 MED ORDER — OXALIPLATIN CHEMO INJECTION 100 MG/20ML
59.5000 mg/m2 | Freq: Once | INTRAVENOUS | Status: AC
  Administered 2021-11-09: 11:00:00 95 mg via INTRAVENOUS
  Filled 2021-11-09: qty 19

## 2021-11-09 MED ORDER — FLUOROURACIL CHEMO INJECTION 500 MG/10ML
280.0000 mg/m2 | Freq: Once | INTRAVENOUS | Status: AC
  Administered 2021-11-09: 13:00:00 450 mg via INTRAVENOUS
  Filled 2021-11-09: qty 9

## 2021-11-09 MED ORDER — SODIUM CHLORIDE 0.9 % IV SOLN
1680.0000 mg/m2 | INTRAVENOUS | Status: DC
  Administered 2021-11-09: 14:00:00 2650 mg via INTRAVENOUS
  Filled 2021-11-09: qty 53

## 2021-11-09 NOTE — Patient Instructions (Addendum)
[facility]  Discharge Instructions: Thank you for choosing [facility] to provide your oncology and hematology care.  If you have a lab appointment with the [facility], please come in thru the Main Entrance and check in at the main information desk.  Wear comfortable clothing and clothing appropriate for easy access to any Portacath or PICC line.   We strive to give you quality time with your provider. You may need to reschedule your appointment if you arrive late (15 or more minutes).  Arriving late affects you and other patients whose appointments are after yours.  Also, if you miss three or more appointments without notifying the office, you may be dismissed from the clinic at the providers discretion.      For prescription refill requests, have your pharmacy contact our office and allow 72 hours for refills to be completed.    Today you received the following chemotherapy and/or immunotherapy agents Oxaliplatin/Leucovorin/5FU   To help prevent nausea and vomiting after your treatment, we encourage you to take your nausea medication as directed.  The chemotherapy medication bag should finish at 46 hours, 96 hours, or 7 days. For example, if your pump is scheduled for 46 hours and it was put on at 4:00 p.m., it should finish at 2:00 p.m. the day it is scheduled to come off regardless of your appointment time.     Estimated time to finish at 1130.   If the display on your pump reads "Low Volume" and it is beeping, take the batteries out of the pump and come to the cancer center for it to be taken off.   If the pump alarms go off prior to the pump reading "Low Volume" then call [phone] and someone can assist you.  If the plunger comes out and the chemotherapy medication is leaking out, please use your home chemo spill kit to clean up the spill. Do NOT use paper towels or other household products.  If you have problems or questions regarding your pump,  please call either [phone] (24 hours a day) or the cancer center Monday-Friday 8:00 a.m.- 4:30 p.m. at the clinic number and we will assist you. If you are unable to get assistance, then go to the nearest Emergency Department and ask the staff to contact the IV team for assistance.    BELOW ARE SYMPTOMS THAT SHOULD BE REPORTED IMMEDIATELY: *FEVER GREATER THAN 100.4 F (38 C) OR HIGHER *CHILLS OR SWEATING *NAUSEA AND VOMITING THAT IS NOT CONTROLLED WITH YOUR NAUSEA MEDICATION *UNUSUAL SHORTNESS OF BREATH *UNUSUAL BRUISING OR BLEEDING *URINARY PROBLEMS (pain or burning when urinating, or frequent urination) *BOWEL PROBLEMS (unusual diarrhea, constipation, pain near the anus) TENDERNESS IN MOUTH AND THROAT WITH OR WITHOUT PRESENCE OF ULCERS (sore throat, sores in mouth, or a toothache) UNUSUAL RASH, SWELLING OR PAIN  UNUSUAL VAGINAL DISCHARGE OR ITCHING   Items with * indicate a potential emergency and should be followed up as soon as possible or go to the Emergency Department if any problems should occur.  Please show the CHEMOTHERAPY ALERT CARD or IMMUNOTHERAPY ALERT CARD at check-in to the Emergency Department and triage nurse.  Should you have questions after your visit or need to cancel or reschedule your appointment, please contact [facility] [phone]  and follow the prompts.  Office hours are 8:00 a.m. to 4:30 p.m. Monday - Friday. Please note that voicemails left after 4:00 p.m. may not be returned until the following business day.  We  are closed weekends and major holidays. You have access to a nurse at all times for urgent questions. Please call the main number to the clinic [phone] and follow the prompts.  For any non-urgent questions, you may also contact your provider using MyChart. We now offer e-Visits for anyone 75 and older to request care online for non-urgent symptoms. For details visit [email or web address].   Also download the MyChart app! Go to  the app store, search "MyChart", open the app, select [facility], and log in with your MyChart username and password.  Due to Covid, a mask is required upon entering the hospital/clinic. If you do not have a mask, one will be given to you upon arrival. For doctor visits, patients may have 1 support person aged 28 or older with them. For treatment visits, patients cannot have anyone with them due to current Covid guidelines and our immunocompromised population. [facility]  Discharge Instructions: Thank you for choosing [facility] to provide your oncology and hematology care.  If you have a lab appointment with the [facility], please come in thru the Main Entrance and check in at the main information desk.  Wear comfortable clothing and clothing appropriate for easy access to any Portacath or PICC line.   We strive to give you quality time with your provider. You may need to reschedule your appointment if you arrive late (15 or more minutes).  Arriving late affects you and other patients whose appointments are after yours.  Also, if you miss three or more appointments without notifying the office, you may be dismissed from the clinic at the providers discretion.      For prescription refill requests, have your pharmacy contact our office and allow 72 hours for refills to be completed.    Please show the CHEMOTHERAPY ALERT CARD or IMMUNOTHERAPY ALERT CARD at check-in to the Emergency Department and triage nurse.  Should you have questions after your visit or need to cancel or reschedule your appointment, please contact [facility] [phone]  and follow the prompts.  Office hours are 8:00 a.m. to 4:30 p.m. Monday - Friday. Please note that voicemails left after 4:00 p.m. may not be returned until the following business day.  We are closed weekends and major holidays. You have access to a nurse at all times for urgent questions. Please call the main number  to the clinic [phone] and follow the prompts.  For any non-urgent questions, you may also contact your provider using MyChart. We now offer e-Visits for anyone 13 and older to request care online for non-urgent symptoms. For details visit [email or web address].   Also download the MyChart app! Go to the app store, search "MyChart", open the app, select [facility], and log in with your MyChart username and password.  Due to Covid, a mask is required upon entering the hospital/clinic. If you do not have a mask, one will be given to you upon arrival. For doctor visits, patients may have 1 support person aged 39 or older with them. For treatment visits, patients cannot have anyone with them due to current Covid guidelines and our immunocompromised population.

## 2021-11-09 NOTE — Progress Notes (Signed)
Pt presents today for treatment today per provider's order. Vital signs and labs WNL for treatment today. Okay for treatment today per Dr.K.  Oxaliplatin/Leucovorin/5FU given today per MD orders. Tolerated infusion without adverse affects. Vital signs stable. No complaints at this time. Discharged from clinic ambulatory in stable condition. Alert and oriented x 3. F/U with [facility] as scheduled. 5FU ambulatory pump infusing.

## 2021-11-09 NOTE — Patient Instructions (Signed)
[place] at [facility] Discharge Instructions   You were seen and examined today by Dr. Delton Coombes.  We will proceed with your treatment today.  Start using over the counter saline nasal spray to help prevent nosebleeds.  Increase [place] to twice a day.    Thank you for choosing [place] at [facility] to provide your oncology and hematology care.  To afford each patient quality time with our provider, please arrive at least 15 minutes before your scheduled appointment time.   If you have a lab appointment with the [facility] please come in thru the Main Entrance and check in at the main information desk.  You need to re-schedule your appointment should you arrive 10 or more minutes late.  We strive to give you quality time with our providers, and arriving late affects you and other patients whose appointments are after yours.  Also, if you no show three or more times for appointments you may be dismissed from the clinic at the providers discretion.     Again, thank you for choosing [facility].  Our hope is that these requests will decrease the amount of time that you wait before being seen by our physicians.       _____________________________________________________________  Should you have questions after your visit to [facility], please contact our office at [phone] and follow the prompts.  Our office hours are 8:00 a.m. and 4:30 p.m. Monday - Friday.  Please note that voicemails left after 4:00 p.m. may not be returned until the following business day.  We are closed weekends and major holidays.  You do have access to a nurse 24-7, just call the main number to the clinic [phone] and do not press any options, hold on the line and a nurse will answer the phone.    For prescription refill requests, have your pharmacy contact our office and allow 72 hours.    Due to Covid, you will need to wear a  mask upon entering the hospital. If you do not have a mask, a mask will be given to you at the Main Entrance upon arrival. For doctor visits, patients may have 1 support person age 50 or older with them. For treatment visits, patients can not have anyone with them due to social distancing guidelines and our immunocompromised population.

## 2021-11-09 NOTE — Progress Notes (Signed)
Patient has been examined by Dr. Katragadda, and vital signs and labs have been reviewed. ANC, Creatinine, LFTs, hemoglobin, and platelets are within treatment parameters per M.D. - pt may proceed with treatment.    °

## 2021-11-11 ENCOUNTER — Encounter (HOSPITAL_COMMUNITY): Payer: Self-pay

## 2021-11-11 ENCOUNTER — Inpatient Hospital Stay (HOSPITAL_COMMUNITY): Payer: 59

## 2021-11-11 ENCOUNTER — Other Ambulatory Visit: Payer: Self-pay

## 2021-11-11 VITALS — BP 135/77 | HR 94 | Temp 97.3°F | Resp 17

## 2021-11-11 DIAGNOSIS — C20 Malignant neoplasm of rectum: Secondary | ICD-10-CM

## 2021-11-11 DIAGNOSIS — Z5111 Encounter for antineoplastic chemotherapy: Secondary | ICD-10-CM | POA: Diagnosis not present

## 2021-11-11 DIAGNOSIS — Z95828 Presence of other vascular implants and grafts: Secondary | ICD-10-CM

## 2021-11-11 MED ORDER — PEGFILGRASTIM-CBQV 6 MG/0.6ML ~~LOC~~ SOSY
6.0000 mg | PREFILLED_SYRINGE | Freq: Once | SUBCUTANEOUS | Status: AC
  Administered 2021-11-11: 13:00:00 6 mg via SUBCUTANEOUS
  Filled 2021-11-11: qty 0.6

## 2021-11-11 MED ORDER — SODIUM CHLORIDE 0.9% FLUSH
10.0000 mL | INTRAVENOUS | Status: DC | PRN
  Administered 2021-11-11: 13:00:00 10 mL

## 2021-11-11 MED ORDER — HEPARIN SOD (PORK) LOCK FLUSH 100 UNIT/ML IV SOLN
500.0000 [IU] | Freq: Once | INTRAVENOUS | Status: AC | PRN
  Administered 2021-11-11: 13:00:00 500 [IU]

## 2021-11-11 NOTE — Patient Instructions (Signed)
[facility]  Discharge Instructions: Thank you for choosing [facility] to provide your oncology and hematology care.  If you have a lab appointment with the Cancer Center, please come in thru the Main Entrance and check in at the main information desk.  Wear comfortable clothing and clothing appropriate for easy access to any Portacath or PICC line.   We strive to give you quality time with your provider. You may need to reschedule your appointment if you arrive late (15 or more minutes).  Arriving late affects you and other patients whose appointments are after yours.  Also, if you miss three or more appointments without notifying the office, you may be dismissed from the clinic at the provider's discretion.      For prescription refill requests, have your pharmacy contact our office and allow 72 hours for refills to be completed.        To help prevent nausea and vomiting after your treatment, we encourage you to take your nausea medication as directed.  BELOW ARE SYMPTOMS THAT SHOULD BE REPORTED IMMEDIATELY: *FEVER GREATER THAN 100.4 F (38 C) OR HIGHER *CHILLS OR SWEATING *NAUSEA AND VOMITING THAT IS NOT CONTROLLED WITH YOUR NAUSEA MEDICATION *UNUSUAL SHORTNESS OF BREATH *UNUSUAL BRUISING OR BLEEDING *URINARY PROBLEMS (pain or burning when urinating, or frequent urination) *BOWEL PROBLEMS (unusual diarrhea, constipation, pain near the anus) TENDERNESS IN MOUTH AND THROAT WITH OR WITHOUT PRESENCE OF ULCERS (sore throat, sores in mouth, or a toothache) UNUSUAL RASH, SWELLING OR PAIN  UNUSUAL VAGINAL DISCHARGE OR ITCHING   Items with * indicate a potential emergency and should be followed up as soon as possible or go to the Emergency Department if any problems should occur.  Please show the CHEMOTHERAPY ALERT CARD or IMMUNOTHERAPY ALERT CARD at check-in to the Emergency Department and triage nurse.  Should you have questions after your visit or need to cancel  or reschedule your appointment, please contact [facility] [phone]  and follow the prompts.  Office hours are 8:00 a.m. to 4:30 p.m. Monday - Friday. Please note that voicemails left after 4:00 p.m. may not be returned until the following business day.  We are closed weekends and major holidays. You have access to a nurse at all times for urgent questions. Please call the main number to the clinic [phone] and follow the prompts.  For any non-urgent questions, you may also contact your provider using MyChart. We now offer e-Visits for anyone 18 and older to request care online for non-urgent symptoms. For details visit [email or web address].   Also download the MyChart app! Go to the app store, search "MyChart", open the app, select [place], and log in with your MyChart username and password.  Due to Covid, a mask is required upon entering the hospital/clinic. If you do not have a mask, one will be given to you upon arrival. For doctor visits, patients may have 1 support person aged 18 or older with them. For treatment visits, patients cannot have anyone with them due to current Covid guidelines and our immunocompromised population.  

## 2021-11-11 NOTE — Progress Notes (Signed)
Patient tolerated injection with no complaints voiced.  Site clean and dry with no bruising or swelling noted at site.  See MAR for details.  Band aid applied.  Patient stable during and after injection.  Vss with discharge and left in satisfactory condition with no s/s of distress noted.    Patient for chemotherapy pump disconnect with no complaints voiced.  Patients port flushed without difficulty.  Good blood return noted with no bruising or swelling noted at site.  Band aid applied.  VSS with discharge and left ambulatory with no s/s of distress noted.   

## 2021-11-19 ENCOUNTER — Encounter (HOSPITAL_COMMUNITY): Payer: Self-pay | Admitting: Hematology

## 2021-11-23 NOTE — Progress Notes (Signed)
[address], [place] [address]   CLINIC:  Medical Oncology/Hematology  PCP:  Lindell Spar, MD [address] / [place] [address] [phone]   REASON FOR VISIT:  Follow-up for sigmoid adenocarcinoma  PRIOR THERAPY: none  NGS Results: not done  CURRENT THERAPY: FOLFOX every 2 weeks x 4 months  BRIEF ONCOLOGIC HISTORY:  Oncology History  Rectal cancer ([place])  04/08/2021 Initial Diagnosis   Rectal cancer ([place])   04/20/2021 - 04/20/2021 Chemotherapy          08/18/2021 -  Chemotherapy   Patient is on Treatment Plan : COLORECTAL FOLFOX q14d x 4 months       CANCER STAGING:  Cancer Staging  Rectal cancer ([place]) Staging form: Colon and Rectum, AJCC 8th Edition - Clinical stage from 04/08/2021: Stage IIIB (cT3, cN1b, cM0) - Unsigned   INTERVAL HISTORY:  [name], a 56 y.o. female, returns for routine follow-up and consideration for next cycle of chemotherapy. Verdia was last seen on 11/09/2021.  Due for cycle #7 of FOLFOX today.   Overall, she tells me she has been feeling pretty well. She reports cold sensitivity in her mouth for 1 week following treatment. She denies tingling/numbness. Her weight is stable. She denies nausea and has not required Zofran. She denies mouth sores. She reports fatigue for 3-4 days following treatment which has since resolved. Her appetite is good. She denies skin rash and ankle swellings.   Overall, she feels ready for next cycle of chemo today.   REVIEW OF SYSTEMS:  Review of Systems  Constitutional:  Negative for appetite change, fatigue and unexpected weight change.  HENT:   Negative for mouth sores.   Cardiovascular:  Negative for leg swelling.  Gastrointestinal:  Negative for nausea.  Skin:  Negative for rash.  Neurological:  Negative for numbness.  All other systems reviewed and are negative.  PAST MEDICAL/SURGICAL HISTORY:  Past Medical History:  Diagnosis Date   Anxiety     Colon cancer ([place]) 01/2021   Constipation    GERD (gastroesophageal reflux disease)    Hypertension    Port-A-Cath in place 08/17/2021   Pre-diabetes    Past Surgical History:  Procedure Laterality Date   BIOPSY  02/18/2021   Procedure: BIOPSY;  Surgeon: Harvel Quale, MD;  [facility]: [facility];  Service: Gastroenterology;;   FACIAL FRACTURE SURGERY  1991   from [place]  02/18/2021   Procedure: FLEXIBLE SIGMOIDOSCOPY;  Surgeon: Harvel Quale, MD;  Location: [facility];  Service: Gastroenterology;;   IR IMAGING GUIDED PORT INSERTION  08/13/2021   POLYPECTOMY  02/18/2021   Procedure: POLYPECTOMY INTESTINAL;  Surgeon: Harvel Quale, MD;  Location: [facility];  Service: Gastroenterology;;   SUBMUCOSAL TATTOO INJECTION  02/18/2021   Procedure: SUBMUCOSAL TATTOO INJECTION;  Surgeon: Harvel Quale, MD;  Location: [facility];  Service: Gastroenterology;;   XI ROBOTIC ASSISTED LOWER ANTERIOR RESECTION N/A 07/10/2021   Procedure: XI ROBOTIC ASSISTED LOWER ANTERIOR RESECTION;  Surgeon: Leighton Ruff, MD;  Location: [facility];  Service: General;  Laterality: N/A;    SOCIAL HISTORY:  Social History   Socioeconomic History   Marital status: Married    Spouse name: Not on file   Number of children: Not on file   Years of education: Not on file   Highest education level: Not on file  Occupational History   Not on file  Tobacco Use  Smoking status: Former    Packs/day: 0.50    Years: 40.00    Pack years: 20.00    Types: Cigarettes    Quit date: 2020    Years since quitting: 3.0   Smokeless tobacco: Never  Vaping Use   Vaping Use: Never used  Substance and Sexual Activity   Alcohol use: Not Currently   Drug use: Never   Sexual activity: Not Currently  Other Topics Concern   Not on file  Social History Narrative   Not on file   Social Determinants of Health   Financial Resource Strain: Low Risk     Difficulty of Paying Living Expenses: Not hard at all  Food Insecurity: No Food Insecurity   Worried About Charity fundraiser in the Last Year: Never true   [place] in the Last Year: Never true  Transportation Needs: No Transportation Needs   Lack of Transportation (Medical): No   Lack of Transportation (Non-Medical): No  Physical Activity: Insufficiently Active   Days of Exercise per Week: 3 days   Minutes of Exercise per Session: 20 min  Stress: No Stress Concern Present   Feeling of Stress : Only a little  Social Connections: Engineer, building services of Communication with Friends and Family: More than three times a week   Frequency of Social Gatherings with Friends and Family: More than three times a week   Attends Religious Services: More than 4 times per year   Active Member of [employer] or Organizations: Yes   Attends Music therapist: More than 4 times per year   Marital Status: Married  Human resources officer Violence: Not At Risk   Fear of Current or Ex-Partner: No   Emotionally Abused: No   Physically Abused: No   Sexually Abused: No    FAMILY HISTORY:  Family History  Problem Relation Age of Onset   Diabetes Sister    Cancer Maternal Aunt    Ovarian cancer Cousin    Ovarian cancer Paternal Aunt    Prostate cancer Cousin     CURRENT MEDICATIONS:  Current Outpatient Medications  Medication Sig Dispense Refill   benzocaine (ORAJEL) 10 % mucosal gel Use as directed 1 application in the mouth or throat as needed for mouth pain.     buPROPion (WELLBUTRIN SR) 200 MG 12 hr tablet Take 1 tablet (200 mg total) by mouth 2 (two) times daily. 60 tablet 5   dicyclomine (BENTYL) 10 MG capsule Take 10 mg by mouth 2 (two) times daily as needed for spasms.     diphenhydrAMINE (BENADRYL) 25 MG tablet Take 25 mg by mouth every 6 (six) hours as needed for allergies.     famotidine (PEPCID) 20 MG tablet Take 1 tablet (20 mg total) by mouth 2 (two) times daily. 30  tablet 0   fluorouracil CALGB 92010 2,400 mg/m2 in sodium chloride 0.9 % 150 mL Inject 2,400 mg/m2 into the vein over 48 hr.     FLUOROURACIL IV Inject into the vein every 14 (fourteen) days.     LEUCOVORIN CALCIUM IV Inject into the vein every 14 (fourteen) days.     loperamide (IMODIUM) 1 MG/5ML solution Take 2 mg by mouth as needed for diarrhea or loose stools.     losartan (COZAAR) 50 MG tablet Take 1 tablet by mouth once daily (Patient taking differently: Take 50 mg by mouth daily.) 90 tablet 0   Multiple Vitamins-Minerals (MULTIVITAMIN WITH MINERALS) tablet Take 1 tablet by mouth  in the morning. Women's Multivitamin     OXALIPLATIN IV Inject into the vein every 14 (fourteen) days.     potassium chloride SA (KLOR-CON) 20 MEQ tablet Take 1 tablet (20 mEq total) by mouth daily. 30 tablet 3   sennosides-docusate sodium (SENOKOT-S) 8.6-50 MG tablet Take 1 tablet by mouth daily as needed for constipation.     simethicone (MYLICON) 80 MG chewable tablet Chew 80 mg by mouth every 6 (six) hours as needed for flatulence.     sucralfate (CARAFATE) 1 g tablet Take 1 tablet (1 g total) by mouth 4 (four) times daily -  with meals and at bedtime. 21 tablet 0   traMADol (ULTRAM) 50 MG tablet Take 1 tablet (50 mg total) by mouth daily as needed. 60 tablet 0   lidocaine-prilocaine (EMLA) cream Apply a small amount to port a cath site and cover with plastic wrap 1 hour prior to chemotherapy appointments (Patient not taking: Reported on 11/24/2021) 30 g 3   ondansetron (ZOFRAN) 8 MG tablet Take 1 tablet (8 mg total) by mouth every 8 (eight) hours as needed for nausea or vomiting. (Patient not taking: Reported on 11/24/2021) 20 tablet 2   prochlorperazine (COMPAZINE) 10 MG tablet Take 1 tablet (10 mg total) by mouth every 6 (six) hours as needed (Nausea or vomiting). (Patient not taking: Reported on 11/24/2021) 30 tablet 1   No current facility-administered medications for this visit.   Facility-Administered  Medications Ordered in Other Visits  Medication Dose Route Frequency Provider Last Rate Last Admin   sodium chloride flush (NS) 0.9 % injection 10 mL  10 mL Intracatheter PRN Derek Jack, MD   10 mL at 09/03/21 1337    ALLERGIES:  No Known Allergies  PHYSICAL EXAM:  Performance status (ECOG): 1 - Symptomatic but completely ambulatory  Vitals:   11/24/21 0844  BP: 132/87  Pulse: 95  Resp: 18  Temp: (!) 97.4 F (36.3 C)  SpO2: 100%   Wt Readings from Last 3 Encounters:  11/24/21 132 lb 6.4 oz (60.1 kg)  11/09/21 131 lb 9.6 oz (59.7 kg)  10/26/21 128 lb (58.1 kg)   Physical Exam Vitals reviewed.  Constitutional:      Appearance: Normal appearance.  Cardiovascular:     Rate and Rhythm: Normal rate and regular rhythm.     Pulses: Normal pulses.     Heart sounds: Normal heart sounds.  Pulmonary:     Effort: Pulmonary effort is normal.     Breath sounds: Normal breath sounds.  Musculoskeletal:     Right lower leg: No edema.     Left lower leg: No edema.  Neurological:     General: No focal deficit present.     Mental Status: She is alert and oriented to person, place, and time.  Psychiatric:        Mood and Affect: Mood normal.        Behavior: Behavior normal.    LABORATORY DATA:  I have reviewed the labs as listed.  CBC Latest Ref Rng & Units 11/24/2021 11/09/2021 10/26/2021  WBC 4.0 - 10.5 K/uL 9.7 8.8 10.6(H)  Hemoglobin 12.0 - 15.0 g/dL 10.1(L) 9.5(L) 8.8(L)  Hematocrit 36.0 - 46.0 % 30.9(L) 30.1(L) 27.4(L)  Platelets 150 - 400 K/uL 202 185 201   CMP Latest Ref Rng & Units 11/24/2021 11/09/2021 10/26/2021  Glucose 70 - 99 mg/dL 100(H) 117(H) 120(H)  BUN 6 - 20 mg/dL _0 Creatinine 0.44 - 1.00 mg/dL 0.72 0.70 0.76  Sodium 135 - 145 mmol/L 138 137 136  Potassium 3.5 - 5.1 mmol/L 3.9 3.3(L) 3.6  Chloride 98 - 111 mmol/L 105 104 106  CO2 22 - 32 mmol/L _0 Calcium 8.9 - 10.3 mg/dL 9.4 9.1 9.2  Total Protein 6.5 - 8.1 g/dL 7.2 6.7 6.6  Total  Bilirubin 0.3 - 1.2 mg/dL 0.4 0.1(L) 0.7  Alkaline Phos 38 - 126 U/L 120 113 107  AST 15 - 41 U/L _1 ALT 0 - 44 U/L 33 24 27    DIAGNOSTIC IMAGING:  I have independently reviewed the scans and discussed with the patient. No results found.   ASSESSMENT:  1.  Rectosigmoid sigmoid adenocarcinoma: - Presentation with the bleeding per rectum for the last 5 to 6 months and constipation. - Colonoscopy on 02/18/2021 showed ulcerated partially obstructing mass found from 12-17 cm from the anal verge, circumferential measuring 5 cm in length. - Biopsy consistent with adenocarcinoma. - CT CAP on 03/06/2021 with circumferential wall thickening, hyperenhancement and fat stranding about the rectosigmoid junction, mass measuring approximately 6 cm in length.  No evidence of lymphadenopathy or metastatic disease. - CEA on 02/19/2021 was 1.8. - MRI of the pelvis on 04/06/2021 shows high rectal T3c/T4AN1 malignancy. - Chemoradiation therapy with Xeloda from 04/22/2021 through 06/01/2021. - CT CAP on 06/29/2021 showed improvement in the rectosigmoid thickening at the site of malignancy.  No clear adenopathy.  No evidence of metastatic disease. - Robotic assisted LAR on 07/10/2021 with pathology showing 2 cm grade 2, margins negative, 1/18 lymph nodes positive, no tumor deposits, positive treatment effect, no perineural invasion, no lymphovascular invasion, [id number], MMR preserved - She will need completion colonoscopy after adjuvant therapy.   2.  Social/family history: - She worked as a Secretary/administrator and recently stopped working.  She quit smoking in October 2021, smoked 1 pack/day for 40 years. - Maternal aunt had lung cancer and was a non-smoker.  Paternal aunt had cancer, type not known to the patient.   PLAN:  1.  T3c/T4N1 rectosigmoid  adenocarcinoma: - She has tolerated cycle 6 with 30% dose reduction very well. - She had cold sensitivity in the mouth which lasted about 7 days.  Denies any tingling or  numbness.  No GI symptoms. - Reviewed labs today which showed normal LFTs.  CBC was grossly normal. - Will increase infusional 5-FU to 1920 per metered squared.  RTC 2 weeks for follow-up for last cycle.   2.  Peripheral neuropathy: - She had mild numbness in the legs on and off after COVID-vaccine.  She does not have any in the last 4 weeks.  4.  Nausea: - Continue Zofran every 8 hours as needed.   Orders placed this encounter:  No orders of the defined types were placed in this encounter.    Derek Jack, MD [place] [phone]   I, Thana Ates, am acting as a scribe for Dr. Derek Jack.  I, Derek Jack MD, have reviewed the above documentation for accuracy and completeness, and I agree with the above.

## 2021-11-24 ENCOUNTER — Inpatient Hospital Stay (HOSPITAL_COMMUNITY): Payer: 59 | Attending: Hematology

## 2021-11-24 ENCOUNTER — Other Ambulatory Visit: Payer: Self-pay

## 2021-11-24 ENCOUNTER — Inpatient Hospital Stay (HOSPITAL_BASED_OUTPATIENT_CLINIC_OR_DEPARTMENT_OTHER): Payer: 59 | Admitting: Hematology

## 2021-11-24 ENCOUNTER — Inpatient Hospital Stay (HOSPITAL_COMMUNITY): Payer: 59

## 2021-11-24 VITALS — BP 128/82 | HR 93 | Temp 98.5°F | Resp 18

## 2021-11-24 VITALS — BP 132/87 | HR 95 | Temp 97.4°F | Resp 18 | Ht 61.0 in | Wt 132.4 lb

## 2021-11-24 DIAGNOSIS — C187 Malignant neoplasm of sigmoid colon: Secondary | ICD-10-CM

## 2021-11-24 DIAGNOSIS — Z5111 Encounter for antineoplastic chemotherapy: Secondary | ICD-10-CM | POA: Insufficient documentation

## 2021-11-24 DIAGNOSIS — G629 Polyneuropathy, unspecified: Secondary | ICD-10-CM | POA: Insufficient documentation

## 2021-11-24 DIAGNOSIS — Z79899 Other long term (current) drug therapy: Secondary | ICD-10-CM | POA: Insufficient documentation

## 2021-11-24 DIAGNOSIS — Z95828 Presence of other vascular implants and grafts: Secondary | ICD-10-CM

## 2021-11-24 DIAGNOSIS — C20 Malignant neoplasm of rectum: Secondary | ICD-10-CM

## 2021-11-24 DIAGNOSIS — Z5189 Encounter for other specified aftercare: Secondary | ICD-10-CM | POA: Diagnosis not present

## 2021-11-24 DIAGNOSIS — Z87891 Personal history of nicotine dependence: Secondary | ICD-10-CM | POA: Diagnosis not present

## 2021-11-24 DIAGNOSIS — R110 Nausea: Secondary | ICD-10-CM | POA: Insufficient documentation

## 2021-11-24 DIAGNOSIS — C19 Malignant neoplasm of rectosigmoid junction: Secondary | ICD-10-CM | POA: Diagnosis present

## 2021-11-24 LAB — CBC WITH DIFFERENTIAL/PLATELET
Abs Immature Granulocytes: 0.4 10*3/uL — ABNORMAL HIGH (ref 0.00–0.07)
Basophils Absolute: 0.1 10*3/uL (ref 0.0–0.1)
Basophils Relative: 1 %
Eosinophils Absolute: 0.1 10*3/uL (ref 0.0–0.5)
Eosinophils Relative: 1 %
HCT: 30.9 % — ABNORMAL LOW (ref 36.0–46.0)
Hemoglobin: 10.1 g/dL — ABNORMAL LOW (ref 12.0–15.0)
Immature Granulocytes: 4 %
Lymphocytes Relative: 19 %
Lymphs Abs: 1.9 10*3/uL (ref 0.7–4.0)
MCH: 37.4 pg — ABNORMAL HIGH (ref 26.0–34.0)
MCHC: 32.7 g/dL (ref 30.0–36.0)
MCV: 114.4 fL — ABNORMAL HIGH (ref 80.0–100.0)
Monocytes Absolute: 1.2 10*3/uL — ABNORMAL HIGH (ref 0.1–1.0)
Monocytes Relative: 12 %
Neutro Abs: 6.1 10*3/uL (ref 1.7–7.7)
Neutrophils Relative %: 63 %
Platelets: 202 10*3/uL (ref 150–400)
RBC: 2.7 MIL/uL — ABNORMAL LOW (ref 3.87–5.11)
RDW: 19 % — ABNORMAL HIGH (ref 11.5–15.5)
WBC: 9.7 10*3/uL (ref 4.0–10.5)
nRBC: 0 % (ref 0.0–0.2)

## 2021-11-24 LAB — COMPREHENSIVE METABOLIC PANEL
ALT: 33 U/L (ref 0–44)
AST: 29 U/L (ref 15–41)
Albumin: 3.8 g/dL (ref 3.5–5.0)
Alkaline Phosphatase: 120 U/L (ref 38–126)
Anion gap: 8 (ref 5–15)
BUN: 9 mg/dL (ref 6–20)
CO2: 25 mmol/L (ref 22–32)
Calcium: 9.4 mg/dL (ref 8.9–10.3)
Chloride: 105 mmol/L (ref 98–111)
Creatinine, Ser: 0.72 mg/dL (ref 0.44–1.00)
GFR, Estimated: >60 mL/min (ref >60)
Glucose, Bld: 100 mg/dL — ABNORMAL HIGH (ref 70–99)
Potassium: 3.9 mmol/L (ref 3.5–5.1)
Sodium: 138 mmol/L (ref 135–145)
Total Bilirubin: 0.4 mg/dL (ref 0.3–1.2)
Total Protein: 7.2 g/dL (ref 6.5–8.1)

## 2021-11-24 LAB — MAGNESIUM: Magnesium: 1.8 mg/dL (ref 1.7–2.4)

## 2021-11-24 MED ORDER — SODIUM CHLORIDE 0.9 % IV SOLN
1920.0000 mg/m2 | INTRAVENOUS | Status: DC
  Administered 2021-11-24: 3050 mg via INTRAVENOUS
  Filled 2021-11-24: qty 61

## 2021-11-24 MED ORDER — HEPARIN SOD (PORK) LOCK FLUSH 100 UNIT/ML IV SOLN: 500.0000 [IU] | Freq: Once | INTRAVENOUS | Status: DC | PRN

## 2021-11-24 MED ORDER — DEXTROSE 5 % IV SOLN: Freq: Once | INTRAVENOUS | Status: AC

## 2021-11-24 MED ORDER — FLUOROURACIL CHEMO INJECTION 500 MG/10ML
280.0000 mg/m2 | Freq: Once | INTRAVENOUS | Status: AC
  Administered 2021-11-24: 450 mg via INTRAVENOUS
  Filled 2021-11-24: qty 9

## 2021-11-24 MED ORDER — OXALIPLATIN CHEMO INJECTION 100 MG/20ML
59.5000 mg/m2 | Freq: Once | INTRAVENOUS | Status: AC
  Administered 2021-11-24: 95 mg via INTRAVENOUS
  Filled 2021-11-24: qty 19

## 2021-11-24 MED ORDER — SODIUM CHLORIDE 0.9% FLUSH: 10.0000 mL | INTRAVENOUS | Status: DC | PRN

## 2021-11-24 MED ORDER — PALONOSETRON HCL INJECTION 0.25 MG/5ML
0.2500 mg | Freq: Once | INTRAVENOUS | Status: AC
  Administered 2021-11-24: 0.25 mg via INTRAVENOUS
  Filled 2021-11-24: qty 5

## 2021-11-24 MED ORDER — SODIUM CHLORIDE 0.9 % IV SOLN
10.0000 mg | Freq: Once | INTRAVENOUS | Status: AC
  Administered 2021-11-24: 10 mg via INTRAVENOUS
  Filled 2021-11-24: qty 10

## 2021-11-24 MED ORDER — LEUCOVORIN CALCIUM INJECTION 350 MG
400.0000 mg/m2 | Freq: Once | INTRAVENOUS | Status: AC
  Administered 2021-11-24: 636 mg via INTRAVENOUS
  Filled 2021-11-24: qty 31.8

## 2021-11-24 NOTE — Patient Instructions (Signed)
[facility] at [facility] Discharge Instructions  You were seen today by Dr. Katragadda. He went over your recent results, and you received your treatment. Dr. Katragadda will see you back in 2 weeks for labs and follow up.   Thank you for choosing [facility] at [facility] to provide your oncology and hematology care.  To afford each patient quality time with our provider, please arrive at least 15 minutes before your scheduled appointment time.   If you have a lab appointment with the Cancer Center please come in thru the Main Entrance and check in at the main information desk  You need to re-schedule your appointment should you arrive 10 or more minutes late.  We strive to give you quality time with our providers, and arriving late affects you and other patients whose appointments are after yours.  Also, if you no show three or more times for appointments you may be dismissed from the clinic at the providers discretion.     Again, thank you for choosing [facility].  Our hope is that these requests will decrease the amount of time that you wait before being seen by our physicians.       _____________________________________________________________  Should you have questions after your visit to  Cancer Center, please contact our office at [phone] between the hours of 8:00 a.m. and 4:30 p.m.  Voicemails left after 4:00 p.m. will not be returned until the following business day.  For prescription refill requests, have your pharmacy contact our office and allow 72 hours.    Cancer Center Support Programs:   > Cancer Support Group  2nd Tuesday of the month 1pm-2pm, Journey Room   

## 2021-11-24 NOTE — Progress Notes (Signed)
Patient has been examined by Dr. Katragadda, and vital signs and labs have been reviewed. ANC, Creatinine, LFTs, hemoglobin, and platelets are within treatment parameters per M.D. - pt may proceed with treatment.    °

## 2021-11-24 NOTE — Progress Notes (Signed)
Patient presents today for chemotherapy treatment.  Patient is in satisfactory condition with no complaints voiced today.  Vital signs are stable.  Labs reviewed by Dr. Delton Coombes during her office visit.  All labs are within treatment parameters.    An insurance issue was encountered.  Patient's insurance has changed and no authorization has been obtained.  MD and Durene Cal were notified.  Per MD we will proceed with treatment today.    Patient tolerated treatment well with no complaints voiced.  Patient verbalized understanding of 5FU home infusion pump and denied any questions or concerns.  Patient left ambulatory in stable condition.  Vital signs stable at discharge.  Follow up as scheduled.

## 2021-11-24 NOTE — Patient Instructions (Signed)
[place]  Discharge Instructions: Thank you for choosing [facility] to provide your oncology and hematology care.  If you have a lab appointment with the [facility], please come in thru the Main Entrance and check in at the main information desk.  Wear comfortable clothing and clothing appropriate for easy access to any Portacath or PICC line.   We strive to give you quality time with your provider. You may need to reschedule your appointment if you arrive late (15 or more minutes).  Arriving late affects you and other patients whose appointments are after yours.  Also, if you miss three or more appointments without notifying the office, you may be dismissed from the clinic at the providers discretion.      For prescription refill requests, have your pharmacy contact our office and allow 72 hours for refills to be completed.    The chemotherapy medication bag should finish at 46 hours, 96 hours, or 7 days. For example, if your pump is scheduled for 46 hours and it was put on at 4:00 p.m., it should finish at 2:00 p.m. the day it is scheduled to come off regardless of your appointment time.     Estimated time to finish at 1230.   If the display on your pump reads "Low Volume" and it is beeping, take the batteries out of the pump and come to the cancer center for it to be taken off.   If the pump alarms go off prior to the pump reading "Low Volume" then call [phone] and someone can assist you.  If the plunger comes out and the chemotherapy medication is leaking out, please use your home chemo spill kit to clean up the spill. Do NOT use paper towels or other household products.  If you have problems or questions regarding your pump, please call either [phone] (24 hours a day) or the cancer center Monday-Friday 8:00 a.m.- 4:30 p.m. at the clinic number and we will assist you. If you are unable to get assistance, then go to the nearest Emergency  Department and ask the staff to contact the IV team for assistance.     To help prevent nausea and vomiting after your treatment, we encourage you to take your nausea medication as directed.  BELOW ARE SYMPTOMS THAT SHOULD BE REPORTED IMMEDIATELY: *FEVER GREATER THAN 100.4 F (38 C) OR HIGHER *CHILLS OR SWEATING *NAUSEA AND VOMITING THAT IS NOT CONTROLLED WITH YOUR NAUSEA MEDICATION *UNUSUAL SHORTNESS OF BREATH *UNUSUAL BRUISING OR BLEEDING *URINARY PROBLEMS (pain or burning when urinating, or frequent urination) *BOWEL PROBLEMS (unusual diarrhea, constipation, pain near the anus) TENDERNESS IN MOUTH AND THROAT WITH OR WITHOUT PRESENCE OF ULCERS (sore throat, sores in mouth, or a toothache) UNUSUAL RASH, SWELLING OR PAIN  UNUSUAL VAGINAL DISCHARGE OR ITCHING   Items with * indicate a potential emergency and should be followed up as soon as possible or go to the Emergency Department if any problems should occur.  Please show the CHEMOTHERAPY ALERT CARD or IMMUNOTHERAPY ALERT CARD at check-in to the Emergency Department and triage nurse.  Should you have questions after your visit or need to cancel or reschedule your appointment, please contact [facility] [phone]  and follow the prompts.  Office hours are 8:00 a.m. to 4:30 p.m. Monday - Friday. Please note that voicemails left after 4:00 p.m. may not be returned until the following business day.  We are closed weekends and major holidays. You have access to a  nurse at all times for urgent questions. Please call the main number to the clinic [phone] and follow the prompts.  For any non-urgent questions, you may also contact your provider using MyChart. We now offer e-Visits for anyone 68 and older to request care online for non-urgent symptoms. For details visit [email or web address].   Also download the MyChart app! Go to the app store, search "MyChart", open the app, select [facility], and log in with your MyChart  username and password.  Due to Covid, a mask is required upon entering the hospital/clinic. If you do not have a mask, one will be given to you upon arrival. For doctor visits, patients may have 1 support person aged 6 or older with them. For treatment visits, patients cannot have anyone with them due to current Covid guidelines and our immunocompromised population.

## 2021-11-24 NOTE — Progress Notes (Signed)
Patients port flushed without difficulty.  Good blood return noted with no bruising or swelling noted at site.  Patient remains accessed for chemotherapy treatment.  

## 2021-11-26 ENCOUNTER — Inpatient Hospital Stay (HOSPITAL_COMMUNITY): Payer: 59

## 2021-11-26 ENCOUNTER — Encounter (HOSPITAL_COMMUNITY): Payer: 59

## 2021-11-26 ENCOUNTER — Other Ambulatory Visit: Payer: Self-pay

## 2021-11-26 ENCOUNTER — Encounter (HOSPITAL_COMMUNITY): Payer: Self-pay | Admitting: Hematology

## 2021-11-26 VITALS — BP 114/73 | HR 95 | Resp 18

## 2021-11-26 DIAGNOSIS — Z5111 Encounter for antineoplastic chemotherapy: Secondary | ICD-10-CM | POA: Diagnosis not present

## 2021-11-26 DIAGNOSIS — Z95828 Presence of other vascular implants and grafts: Secondary | ICD-10-CM

## 2021-11-26 DIAGNOSIS — C20 Malignant neoplasm of rectum: Secondary | ICD-10-CM

## 2021-11-26 MED ORDER — PEGFILGRASTIM-CBQV 6 MG/0.6ML ~~LOC~~ SOSY
6.0000 mg | PREFILLED_SYRINGE | Freq: Once | SUBCUTANEOUS | Status: AC
  Administered 2021-11-26: 6 mg via SUBCUTANEOUS
  Filled 2021-11-26: qty 0.6

## 2021-11-26 MED ORDER — SODIUM CHLORIDE 0.9% FLUSH
10.0000 mL | INTRAVENOUS | Status: DC | PRN
  Administered 2021-11-26: 10 mL

## 2021-11-26 MED ORDER — HEPARIN SOD (PORK) LOCK FLUSH 100 UNIT/ML IV SOLN
500.0000 [IU] | Freq: Once | INTRAVENOUS | Status: AC | PRN
  Administered 2021-11-26: 500 [IU]

## 2021-11-26 NOTE — Patient Instructions (Signed)
[facility]  Discharge Instructions: Thank you for choosing [facility] to provide your oncology and hematology care.  If you have a lab appointment with the [facility], please come in thru the Main Entrance and check in at the main information desk.  Wear comfortable clothing and clothing appropriate for easy access to any Portacath or PICC line.   We strive to give you quality time with your provider. You may need to reschedule your appointment if you arrive late (15 or more minutes).  Arriving late affects you and other patients whose appointments are after yours.  Also, if you miss three or more appointments without notifying the office, you may be dismissed from the clinic at the providers discretion.      For prescription refill requests, have your pharmacy contact our office and allow 72 hours for refills to be completed.    Today you received the following chemotherapy and/or immunotherapy agents Pump stop and Udenyca      To help prevent nausea and vomiting after your treatment, we encourage you to take your nausea medication as directed.  BELOW ARE SYMPTOMS THAT SHOULD BE REPORTED IMMEDIATELY: *FEVER GREATER THAN 100.4 F (38 C) OR HIGHER *CHILLS OR SWEATING *NAUSEA AND VOMITING THAT IS NOT CONTROLLED WITH YOUR NAUSEA MEDICATION *UNUSUAL SHORTNESS OF BREATH *UNUSUAL BRUISING OR BLEEDING *URINARY PROBLEMS (pain or burning when urinating, or frequent urination) *BOWEL PROBLEMS (unusual diarrhea, constipation, pain near the anus) TENDERNESS IN MOUTH AND THROAT WITH OR WITHOUT PRESENCE OF ULCERS (sore throat, sores in mouth, or a toothache) UNUSUAL RASH, SWELLING OR PAIN  UNUSUAL VAGINAL DISCHARGE OR ITCHING   Items with * indicate a potential emergency and should be followed up as soon as possible or go to the Emergency Department if any problems should occur.  Please show the CHEMOTHERAPY ALERT CARD or IMMUNOTHERAPY ALERT CARD at check-in to the  Emergency Department and triage nurse.  Should you have questions after your visit or need to cancel or reschedule your appointment, please contact [facility] [phone]  and follow the prompts.  Office hours are 8:00 a.m. to 4:30 p.m. Monday - Friday. Please note that voicemails left after 4:00 p.m. may not be returned until the following business day.  We are closed weekends and major holidays. You have access to a nurse at all times for urgent questions. Please call the main number to the clinic [phone] and follow the prompts.  For any non-urgent questions, you may also contact your provider using MyChart. We now offer e-Visits for anyone 34 and older to request care online for non-urgent symptoms. For details visit [email or web address].   Also download the MyChart app! Go to the app store, search "MyChart", open the app, select [facility], and log in with your MyChart username and password.  Due to Covid, a mask is required upon entering the hospital/clinic. If you do not have a mask, one will be given to you upon arrival. For doctor visits, patients may have 1 support person aged 51 or older with them. For treatment visits, patients cannot have anyone with them due to current Covid guidelines and our immunocompromised population.

## 2021-11-26 NOTE — Progress Notes (Signed)
Patient presents today for 5FU pump stop and disconnection after 46 hour continous infusion.   5FU pump deaccessed.  Patients port flushed without difficulty.  Good blood return noted with no bruising or swelling noted at site.  needle removed intact.  Band aid applied.  Stable during Udenyca administration without incident; injection site WNL; see MAR for injection details.  Patient tolerated procedure well and without incident.  No questions or complaints noted at this time. VSS with discharge and left in satisfactory condition ambulatory with no s/s of distress noted.

## 2021-12-07 ENCOUNTER — Inpatient Hospital Stay (HOSPITAL_COMMUNITY): Payer: 59

## 2021-12-07 ENCOUNTER — Other Ambulatory Visit: Payer: Self-pay

## 2021-12-07 ENCOUNTER — Inpatient Hospital Stay (HOSPITAL_BASED_OUTPATIENT_CLINIC_OR_DEPARTMENT_OTHER): Payer: 59 | Admitting: Hematology

## 2021-12-07 VITALS — BP 142/89 | HR 88 | Temp 98.7°F | Resp 18

## 2021-12-07 DIAGNOSIS — C20 Malignant neoplasm of rectum: Secondary | ICD-10-CM

## 2021-12-07 DIAGNOSIS — Z95828 Presence of other vascular implants and grafts: Secondary | ICD-10-CM

## 2021-12-07 DIAGNOSIS — C187 Malignant neoplasm of sigmoid colon: Secondary | ICD-10-CM

## 2021-12-07 DIAGNOSIS — Z5111 Encounter for antineoplastic chemotherapy: Secondary | ICD-10-CM | POA: Diagnosis not present

## 2021-12-07 LAB — CBC WITH DIFFERENTIAL/PLATELET
Band Neutrophils: 2 %
Basophils Absolute: 0 10*3/uL (ref 0.0–0.1)
Basophils Relative: 0 %
Eosinophils Absolute: 0.1 10*3/uL (ref 0.0–0.5)
Eosinophils Relative: 1 %
HCT: 32.3 % — ABNORMAL LOW (ref 36.0–46.0)
Hemoglobin: 10.8 g/dL — ABNORMAL LOW (ref 12.0–15.0)
Lymphocytes Relative: 16 %
Lymphs Abs: 1.7 10*3/uL (ref 0.7–4.0)
MCH: 38.4 pg — ABNORMAL HIGH (ref 26.0–34.0)
MCHC: 33.4 g/dL (ref 30.0–36.0)
MCV: 114.9 fL — ABNORMAL HIGH (ref 80.0–100.0)
Monocytes Absolute: 1 10*3/uL (ref 0.1–1.0)
Monocytes Relative: 10 %
Neutro Abs: 7.6 10*3/uL (ref 1.7–7.7)
Neutrophils Relative %: 71 %
Platelets: 172 10*3/uL (ref 150–400)
RBC: 2.81 MIL/uL — ABNORMAL LOW (ref 3.87–5.11)
RDW: 17 % — ABNORMAL HIGH (ref 11.5–15.5)
WBC: 10.4 10*3/uL (ref 4.0–10.5)
nRBC: 0 % (ref 0.0–0.2)

## 2021-12-07 LAB — COMPREHENSIVE METABOLIC PANEL
ALT: 60 U/L — ABNORMAL HIGH (ref 0–44)
AST: 47 U/L — ABNORMAL HIGH (ref 15–41)
Albumin: 4 g/dL (ref 3.5–5.0)
Alkaline Phosphatase: 122 U/L (ref 38–126)
Anion gap: 6 (ref 5–15)
BUN: 12 mg/dL (ref 6–20)
CO2: 23 mmol/L (ref 22–32)
Calcium: 9.2 mg/dL (ref 8.9–10.3)
Chloride: 106 mmol/L (ref 98–111)
Creatinine, Ser: 0.74 mg/dL (ref 0.44–1.00)
GFR, Estimated: >60 mL/min (ref >60)
Glucose, Bld: 101 mg/dL — ABNORMAL HIGH (ref 70–99)
Potassium: 3.9 mmol/L (ref 3.5–5.1)
Sodium: 135 mmol/L (ref 135–145)
Total Bilirubin: 0.4 mg/dL (ref 0.3–1.2)
Total Protein: 7.2 g/dL (ref 6.5–8.1)

## 2021-12-07 LAB — MAGNESIUM: Magnesium: 1.9 mg/dL (ref 1.7–2.4)

## 2021-12-07 MED ORDER — HEPARIN SOD (PORK) LOCK FLUSH 100 UNIT/ML IV SOLN: 500.0000 [IU] | Freq: Once | INTRAVENOUS | Status: DC | PRN

## 2021-12-07 MED ORDER — SODIUM CHLORIDE 0.9 % IV SOLN
10.0000 mg | Freq: Once | INTRAVENOUS | Status: AC
  Administered 2021-12-07: 10 mg via INTRAVENOUS
  Filled 2021-12-07: qty 10

## 2021-12-07 MED ORDER — OXALIPLATIN CHEMO INJECTION 100 MG/20ML
59.5000 mg/m2 | Freq: Once | INTRAVENOUS | Status: AC
  Administered 2021-12-07: 95 mg via INTRAVENOUS
  Filled 2021-12-07: qty 19

## 2021-12-07 MED ORDER — PALONOSETRON HCL INJECTION 0.25 MG/5ML
0.2500 mg | Freq: Once | INTRAVENOUS | Status: AC
  Administered 2021-12-07: 0.25 mg via INTRAVENOUS
  Filled 2021-12-07: qty 5

## 2021-12-07 MED ORDER — SODIUM CHLORIDE 0.9% FLUSH: 10.0000 mL | INTRAVENOUS | Status: DC | PRN

## 2021-12-07 MED ORDER — SODIUM CHLORIDE 0.9 % IV SOLN
1920.0000 mg/m2 | INTRAVENOUS | Status: DC
  Administered 2021-12-07: 3050 mg via INTRAVENOUS
  Filled 2021-12-07: qty 61

## 2021-12-07 MED ORDER — DEXTROSE 5 % IV SOLN: Freq: Once | INTRAVENOUS | Status: AC

## 2021-12-07 MED ORDER — LEUCOVORIN CALCIUM INJECTION 350 MG
400.0000 mg/m2 | Freq: Once | INTRAVENOUS | Status: AC
  Administered 2021-12-07: 636 mg via INTRAVENOUS
  Filled 2021-12-07: qty 31.8

## 2021-12-07 MED ORDER — FLUOROURACIL CHEMO INJECTION 500 MG/10ML
280.0000 mg/m2 | Freq: Once | INTRAVENOUS | Status: AC
  Administered 2021-12-07: 450 mg via INTRAVENOUS
  Filled 2021-12-07: qty 9

## 2021-12-07 NOTE — Patient Instructions (Addendum)
[place] at [facility] Discharge Instructions   You were seen and examined today by Dr. Delton Coombes.  He reviewed your lab work which was normal/stable.  We will proceed with your last treatment today.  Return as scheduled for lab work and office visit.    Thank you for choosing [facility] at [facility] to provide your oncology and hematology care.  To afford each patient quality time with our provider, please arrive at least 15 minutes before your scheduled appointment time.   If you have a lab appointment with the [facility] please come in thru the Main Entrance and check in at the main information desk.  You need to re-schedule your appointment should you arrive 10 or more minutes late.  We strive to give you quality time with our providers, and arriving late affects you and other patients whose appointments are after yours.  Also, if you no show three or more times for appointments you may be dismissed from the clinic at the providers discretion.     Again, thank you for choosing [facility].  Our hope is that these requests will decrease the amount of time that you wait before being seen by our physicians.       _____________________________________________________________  Should you have questions after your visit to [facility], please contact our office at [phone] and follow the prompts.  Our office hours are 8:00 a.m. and 4:30 p.m. Monday - Friday.  Please note that voicemails left after 4:00 p.m. may not be returned until the following business day.  We are closed weekends and major holidays.  You do have access to a nurse 24-7, just call the main number to the clinic [phone] and do not press any options, hold on the line and a nurse will answer the phone.    For prescription refill requests, have your pharmacy contact our office and allow 72 hours.    Due to Covid, you will need to wear a  mask upon entering the hospital. If you do not have a mask, a mask will be given to you at the Main Entrance upon arrival. For doctor visits, patients may have 1 support person age 26 or older with them. For treatment visits, patients can not have anyone with them due to social distancing guidelines and our immunocompromised population.

## 2021-12-07 NOTE — Patient Instructions (Signed)
[facility]  Discharge Instructions: Thank you for choosing [facility] to provide your oncology and hematology care.  If you have a lab appointment with the [facility], please come in thru the Main Entrance and check in at the main information desk.  Wear comfortable clothing and clothing appropriate for easy access to any Portacath or PICC line.   We strive to give you quality time with your provider. You may need to reschedule your appointment if you arrive late (15 or more minutes).  Arriving late affects you and other patients whose appointments are after yours.  Also, if you miss three or more appointments without notifying the office, you may be dismissed from the clinic at the providers discretion.      For prescription refill requests, have your pharmacy contact our office and allow 72 hours for refills to be completed.    Today you received the following chemotherapy and/or immunotherapy agents FOLFOX with 5FU pump connection      To help prevent nausea and vomiting after your treatment, we encourage you to take your nausea medication as directed.  BELOW ARE SYMPTOMS THAT SHOULD BE REPORTED IMMEDIATELY: *FEVER GREATER THAN 100.4 F (38 C) OR HIGHER *CHILLS OR SWEATING *NAUSEA AND VOMITING THAT IS NOT CONTROLLED WITH YOUR NAUSEA MEDICATION *UNUSUAL SHORTNESS OF BREATH *UNUSUAL BRUISING OR BLEEDING *URINARY PROBLEMS (pain or burning when urinating, or frequent urination) *BOWEL PROBLEMS (unusual diarrhea, constipation, pain near the anus) TENDERNESS IN MOUTH AND THROAT WITH OR WITHOUT PRESENCE OF ULCERS (sore throat, sores in mouth, or a toothache) UNUSUAL RASH, SWELLING OR PAIN  UNUSUAL VAGINAL DISCHARGE OR ITCHING   Items with * indicate a potential emergency and should be followed up as soon as possible or go to the Emergency Department if any problems should occur.  Please show the CHEMOTHERAPY ALERT CARD or IMMUNOTHERAPY ALERT CARD at check-in  to the Emergency Department and triage nurse.  Should you have questions after your visit or need to cancel or reschedule your appointment, please contact [facility] [phone]  and follow the prompts.  Office hours are 8:00 a.m. to 4:30 p.m. Monday - Friday. Please note that voicemails left after 4:00 p.m. may not be returned until the following business day.  We are closed weekends and major holidays. You have access to a nurse at all times for urgent questions. Please call the main number to the clinic [phone] and follow the prompts.  For any non-urgent questions, you may also contact your provider using MyChart. We now offer e-Visits for anyone 77 and older to request care online for non-urgent symptoms. For details visit [email or web address].   Also download the MyChart app! Go to the app store, search "MyChart", open the app, select [facility] [place], and log in with your MyChart username and password.  Due to Covid, a mask is required upon entering the hospital/clinic. If you do not have a mask, one will be given to you upon arrival. For doctor visits, patients may have 1 support person aged 32 or older with them. For treatment visits, patients cannot have anyone with them due to current Covid guidelines and our immunocompromised population.

## 2021-12-07 NOTE — Progress Notes (Signed)
Patient presents today for FOLFOX with 5FU pump start per providers order.  Vital signs and labs reviewed by MD.  Message received from Boykin Nearing RN/Dr. Delton Coombes patient okay for treatment.   FOLFOX infusion with 5FU pump connection completed without adverse affects.  Verified RUN on pump screen with patient.  Vital signs stable.  No complaints at this time.  Discharge from clinic ambulatory in stable condition.  Alert and oriented X 3.  Follow up with [facility] as scheduled.

## 2021-12-07 NOTE — Progress Notes (Signed)
Patient has been examined by Dr. Katragadda, and vital signs and labs have been reviewed. ANC, Creatinine, LFTs, hemoglobin, and platelets are within treatment parameters per M.D. - pt may proceed with treatment.    °

## 2021-12-07 NOTE — Progress Notes (Signed)
[place], [place] [address]   CLINIC:  Medical Oncology/Hematology  PCP:  Lindell Spar, MD [address] / [place] [address] [phone]   REASON FOR VISIT:  Follow-up for sigmoid adenocarcinoma  PRIOR THERAPY: none  NGS Results: not done  CURRENT THERAPY: FOLFOX every 2 weeks x 4 months  BRIEF ONCOLOGIC HISTORY:  Oncology History  Rectal cancer ([facility])  04/08/2021 Initial Diagnosis   Rectal cancer ([facility])   04/20/2021 - 04/20/2021 Chemotherapy          08/18/2021 -  Chemotherapy   Patient is on Treatment Plan : COLORECTAL FOLFOX q14d x 4 months       CANCER STAGING:  Cancer Staging  Rectal cancer ([facility]) Staging form: Colon and Rectum, AJCC 8th Edition - Clinical stage from 04/08/2021: Stage IIIB (cT3, cN1b, cM0) - Unsigned   INTERVAL HISTORY:  Alexandra Bright, a 56 y.o. female, returns for routine follow-up and consideration for next cycle of chemotherapy. [name] was last seen on 11/24/2021.  Due for cycle #8 of FOLFOX today.   Overall, she tells me she has been feeling pretty well. She reports fatigue. She reports cold sensitivity. She denies n/v/d/c. She denies ankle swellings.   Overall, she feels ready for next cycle of chemo today.   REVIEW OF SYSTEMS:  Review of Systems  Constitutional:  Positive for fatigue. Negative for appetite change.  Cardiovascular:  Negative for leg swelling.  Gastrointestinal:  Negative for constipation, diarrhea, nausea and vomiting.  Neurological:  Positive for numbness (cold sensitivity).  All other systems reviewed and are negative.  PAST MEDICAL/SURGICAL HISTORY:  Past Medical History:  Diagnosis Date   Anxiety    Colon cancer ([facility]) 01/2021   Constipation    GERD (gastroesophageal reflux disease)    Hypertension    Port-A-Cath in place 08/17/2021   Pre-diabetes    Past Surgical History:  Procedure Laterality Date   BIOPSY  02/18/2021   Procedure: BIOPSY;  Surgeon:  Harvel Quale, MD;  [facility];  Service: Gastroenterology;;   FACIAL FRACTURE SURGERY  1991   from [place]  02/18/2021   Procedure: FLEXIBLE SIGMOIDOSCOPY;  Surgeon: Harvel Quale, MD;  Location: [facility];  Service: Gastroenterology;;   IR IMAGING GUIDED PORT INSERTION  08/13/2021   POLYPECTOMY  02/18/2021   Procedure: POLYPECTOMY INTESTINAL;  Surgeon: Harvel Quale, MD;  Location: [facility];  Service: Gastroenterology;;   SUBMUCOSAL TATTOO INJECTION  02/18/2021   Procedure: SUBMUCOSAL TATTOO INJECTION;  Surgeon: Harvel Quale, MD;  Location: [facility];  Service: Gastroenterology;;   XI ROBOTIC ASSISTED LOWER ANTERIOR RESECTION N/A 07/10/2021   Procedure: XI ROBOTIC ASSISTED LOWER ANTERIOR RESECTION;  Surgeon: Leighton Ruff, MD;  Location: [facility];  Service: General;  Laterality: N/A;    SOCIAL HISTORY:  Social History   Socioeconomic History   Marital status: Married    Spouse name: Not on file   Number of children: Not on file   Years of education: Not on file   Highest education level: Not on file  Occupational History   Not on file  Tobacco Use   Smoking status: Former    Packs/day: 0.50    Years: 40.00    Pack years: 20.00    Types: Cigarettes    Quit date: 2020    Years since quitting: 3.0   Smokeless tobacco: Never  Vaping Use   Vaping Use: Never  used  Substance and Sexual Activity   Alcohol use: Not Currently   Drug use: Never   Sexual activity: Not Currently  Other Topics Concern   Not on file  Social History Narrative   Not on file   Social Determinants of Health   Financial Resource Strain: Low Risk    Difficulty of Paying Living Expenses: Not hard at all  Food Insecurity: No Food Insecurity   Worried About Charity fundraiser in the Last Year: Never true   [place] in the Last Year: Never true  Transportation Needs: No Transportation Needs   Lack of  Transportation (Medical): No   Lack of Transportation (Non-Medical): No  Physical Activity: Insufficiently Active   Days of Exercise per Week: 3 days   Minutes of Exercise per Session: 20 min  Stress: No Stress Concern Present   Feeling of Stress : Only a little  Social Connections: Engineer, building services of Communication with Friends and Family: More than three times a week   Frequency of Social Gatherings with Friends and Family: More than three times a week   Attends Religious Services: More than 4 times per year   Active Member of [employer] or Organizations: Yes   Attends Music therapist: More than 4 times per year   Marital Status: Married  Human resources officer Violence: Not At Risk   Fear of Current or Ex-Partner: No   Emotionally Abused: No   Physically Abused: No   Sexually Abused: No    FAMILY HISTORY:  Family History  Problem Relation Age of Onset   Diabetes Sister    Cancer Maternal Aunt    Ovarian cancer Cousin    Ovarian cancer Paternal Aunt    Prostate cancer Cousin     CURRENT MEDICATIONS:  Current Outpatient Medications  Medication Sig Dispense Refill   buPROPion (WELLBUTRIN SR) 200 MG 12 hr tablet Take 1 tablet (200 mg total) by mouth 2 (two) times daily. 60 tablet 5   famotidine (PEPCID) 20 MG tablet Take 1 tablet (20 mg total) by mouth 2 (two) times daily. 30 tablet 0   fluorouracil CALGB 14431 2,400 mg/m2 in sodium chloride 0.9 % 150 mL Inject 2,400 mg/m2 into the vein over 48 hr.     FLUOROURACIL IV Inject into the vein every 14 (fourteen) days.     LEUCOVORIN CALCIUM IV Inject into the vein every 14 (fourteen) days.     lidocaine-prilocaine (EMLA) cream Apply a small amount to port a cath site and cover with plastic wrap 1 hour prior to chemotherapy appointments 30 g 3   loperamide (IMODIUM) 1 MG/5ML solution Take 2 mg by mouth as needed for diarrhea or loose stools.     losartan (COZAAR) 50 MG tablet Take 1 tablet by mouth once daily  (Patient taking differently: Take 50 mg by mouth daily.) 90 tablet 0   Multiple Vitamins-Minerals (MULTIVITAMIN WITH MINERALS) tablet Take 1 tablet by mouth in the morning. Women's Multivitamin     OXALIPLATIN IV Inject into the vein every 14 (fourteen) days.     potassium chloride SA (KLOR-CON) 20 MEQ tablet Take 1 tablet (20 mEq total) by mouth daily. 30 tablet 3   sennosides-docusate sodium (SENOKOT-S) 8.6-50 MG tablet Take 1 tablet by mouth daily as needed for constipation.     sucralfate (CARAFATE) 1 g tablet Take 1 tablet (1 g total) by mouth 4 (four) times daily -  with meals and at bedtime. 21 tablet 0  benzocaine (ORAJEL) 10 % mucosal gel Use as directed 1 application in the mouth or throat as needed for mouth pain. (Patient not taking: Reported on 12/07/2021)     dicyclomine (BENTYL) 10 MG capsule Take 10 mg by mouth 2 (two) times daily as needed for spasms. (Patient not taking: Reported on 12/07/2021)     diphenhydrAMINE (BENADRYL) 25 MG tablet Take 25 mg by mouth every 6 (six) hours as needed for allergies. (Patient not taking: Reported on 12/07/2021)     ondansetron (ZOFRAN) 8 MG tablet Take 1 tablet (8 mg total) by mouth every 8 (eight) hours as needed for nausea or vomiting. (Patient not taking: Reported on 12/07/2021) 20 tablet 2   prochlorperazine (COMPAZINE) 10 MG tablet Take 1 tablet (10 mg total) by mouth every 6 (six) hours as needed (Nausea or vomiting). (Patient not taking: Reported on 12/07/2021) 30 tablet 1   simethicone (MYLICON) 80 MG chewable tablet Chew 80 mg by mouth every 6 (six) hours as needed for flatulence. (Patient not taking: Reported on 12/07/2021)     traMADol (ULTRAM) 50 MG tablet Take 1 tablet (50 mg total) by mouth daily as needed. (Patient not taking: Reported on 12/07/2021) 60 tablet 0   No current facility-administered medications for this visit.   Facility-Administered Medications Ordered in Other Visits  Medication Dose Route Frequency Provider Last Rate Last  Admin   sodium chloride flush (NS) 0.9 % injection 10 mL  10 mL Intracatheter PRN Derek Jack, MD   10 mL at 09/03/21 1337    ALLERGIES:  No Known Allergies  PHYSICAL EXAM:  Performance status (ECOG): 1 - Symptomatic but completely ambulatory  There were no vitals filed for this visit. Wt Readings from Last 3 Encounters:  12/07/21 130 lb 9.6 oz (59.2 kg)  11/24/21 132 lb 6.4 oz (60.1 kg)  11/09/21 131 lb 9.6 oz (59.7 kg)   Physical Exam Vitals reviewed.  Constitutional:      Appearance: Normal appearance.  Cardiovascular:     Rate and Rhythm: Normal rate and regular rhythm.     Pulses: Normal pulses.     Heart sounds: Normal heart sounds.  Pulmonary:     Effort: Pulmonary effort is normal.     Breath sounds: Normal breath sounds.  Neurological:     General: No focal deficit present.     Mental Status: She is alert and oriented to person, place, and time.  Psychiatric:        Mood and Affect: Mood normal.        Behavior: Behavior normal.    LABORATORY DATA:  I have reviewed the labs as listed.  CBC Latest Ref Rng & Units 12/07/2021 11/24/2021 11/09/2021  WBC 4.0 - 10.5 K/uL 10.4 9.7 8.8  Hemoglobin 12.0 - 15.0 g/dL 10.8(L) 10.1(L) 9.5(L)  Hematocrit 36.0 - 46.0 % 32.3(L) 30.9(L) 30.1(L)  Platelets 150 - 400 K/uL 172 202 185   CMP Latest Ref Rng & Units 12/07/2021 11/24/2021 11/09/2021  Glucose 70 - 99 mg/dL 101(H) 100(H) 117(H)  BUN 6 - 20 mg/dL 12 9 7   Creatinine 0.44 - 1.00 mg/dL 0.74 0.72 0.70  Sodium 135 - 145 mmol/L 135 138 137  Potassium 3.5 - 5.1 mmol/L 3.9 3.9 3.3(L)  Chloride 98 - 111 mmol/L 106 105 104  CO2 22 - 32 mmol/L 23 25 24   Calcium 8.9 - 10.3 mg/dL 9.2 9.4 9.1  Total Protein 6.5 - 8.1 g/dL 7.2 7.2 6.7  Total Bilirubin 0.3 - 1.2 mg/dL 0.4 0.4  0.1(L)  Alkaline Phos 38 - 126 U/L 122 120 113  AST 15 - 41 U/L 47(H) 29 20  ALT 0 - 44 U/L 60(H) 33 24    DIAGNOSTIC IMAGING:  I have independently reviewed the scans and discussed with the  patient. No results found.   ASSESSMENT:  1.  Rectosigmoid sigmoid adenocarcinoma: - Presentation with the bleeding per rectum for the last 5 to 6 months and constipation. - Colonoscopy on 02/18/2021 showed ulcerated partially obstructing mass found from 12-17 cm from the anal verge, circumferential measuring 5 cm in length. - Biopsy consistent with adenocarcinoma. - CT CAP on 03/06/2021 with circumferential wall thickening, hyperenhancement and fat stranding about the rectosigmoid junction, mass measuring approximately 6 cm in length.  No evidence of lymphadenopathy or metastatic disease. - CEA on 02/19/2021 was 1.8. - MRI of the pelvis on 04/06/2021 shows high rectal T3c/T4AN1 malignancy. - Chemoradiation therapy with Xeloda from 04/22/2021 through 06/01/2021. - CT CAP on 06/29/2021 showed improvement in the rectosigmoid thickening at the site of malignancy.  No clear adenopathy.  No evidence of metastatic disease. - Robotic assisted LAR on 07/10/2021 with pathology showing 2 cm grade 2, margins negative, 1/18 lymph nodes positive, no tumor deposits, positive treatment effect, no perineural invasion, no lymphovascular invasion, [id number], MMR preserved - She will need completion colonoscopy after adjuvant therapy.   2.  Social/family history: - She worked as a Secretary/administrator and recently stopped working.  She quit smoking in October 2021, smoked 1 pack/day for 40 years. - Maternal aunt had lung cancer and was a non-smoker.  Paternal aunt had cancer, type not known to the patient.   PLAN:  1.  T3c/T4N1 rectosigmoid  adenocarcinoma: - She has tolerated cycle 7 when I have increased infusional 5-FU dose slightly. - She did not have any GI side effects. - Reviewed labs today which showed mildly elevated AST and ALT of 47 and 60 respectively.  CBC was grossly normal.  Macrocytic anemia from myelosuppression. - Recommend proceeding with cycle 8 today. - RTC 4 weeks for follow-up with repeat CEA, CBC and  CMP. - We will likely schedule her with a 31-monthfollow-up with CT [facility] and CEA at next visit.   2.  Peripheral neuropathy: - Tingling and numbness in the fingertips when exposed to cold present.  No constant neuropathy.  4.  Nausea: - Continue Zofran every 8 hours as needed.   Orders placed this encounter:  No orders of the defined types were placed in this encounter.    SDerek Jack MD ALoch [phone]  I, KThana Ates am acting as a scribe for Dr. SDerek Jack  I, SDerek JackMD, have reviewed the above documentation for accuracy and completeness, and I agree with the above.

## 2021-12-09 ENCOUNTER — Other Ambulatory Visit: Payer: Self-pay

## 2021-12-09 ENCOUNTER — Inpatient Hospital Stay (HOSPITAL_COMMUNITY): Payer: 59

## 2021-12-09 ENCOUNTER — Encounter (HOSPITAL_COMMUNITY): Payer: Self-pay

## 2021-12-09 ENCOUNTER — Encounter (INDEPENDENT_AMBULATORY_CARE_PROVIDER_SITE_OTHER): Payer: Self-pay | Admitting: *Deleted

## 2021-12-09 VITALS — BP 137/88 | HR 98 | Temp 96.6°F | Resp 18

## 2021-12-09 DIAGNOSIS — Z95828 Presence of other vascular implants and grafts: Secondary | ICD-10-CM

## 2021-12-09 DIAGNOSIS — C20 Malignant neoplasm of rectum: Secondary | ICD-10-CM

## 2021-12-09 DIAGNOSIS — Z5111 Encounter for antineoplastic chemotherapy: Secondary | ICD-10-CM | POA: Diagnosis not present

## 2021-12-09 MED ORDER — SODIUM CHLORIDE 0.9% FLUSH
10.0000 mL | INTRAVENOUS | Status: DC | PRN
  Administered 2021-12-09: 10 mL

## 2021-12-09 MED ORDER — HEPARIN SOD (PORK) LOCK FLUSH 100 UNIT/ML IV SOLN
500.0000 [IU] | Freq: Once | INTRAVENOUS | Status: AC | PRN
  Administered 2021-12-09: 500 [IU]

## 2021-12-09 MED ORDER — PEGFILGRASTIM-CBQV 6 MG/0.6ML ~~LOC~~ SOSY
6.0000 mg | PREFILLED_SYRINGE | Freq: Once | SUBCUTANEOUS | Status: AC
  Administered 2021-12-09: 6 mg via SUBCUTANEOUS
  Filled 2021-12-09: qty 0.6

## 2021-12-09 NOTE — Progress Notes (Signed)
Patient for chemotherapy pump disconnect with no complaints voiced.  Patients port flushed without difficulty.  Good blood return noted with no bruising or swelling noted at site.  Band aid applied.  VSS with discharge and left ambulatory with no s/s of distress noted.    Patient tolerated injection with no complaints voiced.  Site clean and dry with no bruising or swelling noted at site.  See MAR for details.  Band aid applied.  Patient stable during and after injection.  Vss with discharge and left in satisfactory condition with no s/s of distress noted.   

## 2021-12-09 NOTE — Patient Instructions (Signed)
[facility]  Discharge Instructions: Thank you for choosing [facility] to provide your oncology and hematology care.  If you have a lab appointment with the Cancer Center, please come in thru the Main Entrance and check in at the main information desk.  Wear comfortable clothing and clothing appropriate for easy access to any Portacath or PICC line.   We strive to give you quality time with your provider. You may need to reschedule your appointment if you arrive late (15 or more minutes).  Arriving late affects you and other patients whose appointments are after yours.  Also, if you miss three or more appointments without notifying the office, you may be dismissed from the clinic at the provider's discretion.      For prescription refill requests, have your pharmacy contact our office and allow 72 hours for refills to be completed.        To help prevent nausea and vomiting after your treatment, we encourage you to take your nausea medication as directed.  BELOW ARE SYMPTOMS THAT SHOULD BE REPORTED IMMEDIATELY: *FEVER GREATER THAN 100.4 F (38 C) OR HIGHER *CHILLS OR SWEATING *NAUSEA AND VOMITING THAT IS NOT CONTROLLED WITH YOUR NAUSEA MEDICATION *UNUSUAL SHORTNESS OF BREATH *UNUSUAL BRUISING OR BLEEDING *URINARY PROBLEMS (pain or burning when urinating, or frequent urination) *BOWEL PROBLEMS (unusual diarrhea, constipation, pain near the anus) TENDERNESS IN MOUTH AND THROAT WITH OR WITHOUT PRESENCE OF ULCERS (sore throat, sores in mouth, or a toothache) UNUSUAL RASH, SWELLING OR PAIN  UNUSUAL VAGINAL DISCHARGE OR ITCHING   Items with * indicate a potential emergency and should be followed up as soon as possible or go to the Emergency Department if any problems should occur.  Please show the CHEMOTHERAPY ALERT CARD or IMMUNOTHERAPY ALERT CARD at check-in to the Emergency Department and triage nurse.  Should you have questions after your visit or need to cancel  or reschedule your appointment, please contact [facility] [phone]  and follow the prompts.  Office hours are 8:00 a.m. to 4:30 p.m. Monday - Friday. Please note that voicemails left after 4:00 p.m. may not be returned until the following business day.  We are closed weekends and major holidays. You have access to a nurse at all times for urgent questions. Please call the main number to the clinic [phone] and follow the prompts.  For any non-urgent questions, you may also contact your provider using MyChart. We now offer e-Visits for anyone 18 and older to request care online for non-urgent symptoms. For details visit [email or web address].   Also download the MyChart app! Go to the app store, search "MyChart", open the app, select [facility], and log in with your MyChart username and password.  Due to Covid, a mask is required upon entering the hospital/clinic. If you do not have a mask, one will be given to you upon arrival. For doctor visits, patients may have 1 support person aged 18 or older with them. For treatment visits, patients cannot have anyone with them due to current Covid guidelines and our immunocompromised population.  

## 2021-12-14 ENCOUNTER — Telehealth: Payer: Self-pay

## 2021-12-14 ENCOUNTER — Other Ambulatory Visit: Payer: Self-pay

## 2021-12-14 ENCOUNTER — Encounter (HOSPITAL_COMMUNITY): Payer: Self-pay

## 2021-12-14 DIAGNOSIS — Z124 Encounter for screening for malignant neoplasm of cervix: Secondary | ICD-10-CM

## 2021-12-14 NOTE — Telephone Encounter (Signed)
Patient had called and requested a GYN appointment

## 2021-12-14 NOTE — Telephone Encounter (Signed)
Referral placed to [facility]

## 2021-12-15 ENCOUNTER — Encounter (HOSPITAL_COMMUNITY): Payer: Self-pay

## 2021-12-15 ENCOUNTER — Other Ambulatory Visit: Payer: Self-pay | Admitting: Internal Medicine

## 2021-12-15 DIAGNOSIS — I10 Essential (primary) hypertension: Secondary | ICD-10-CM

## 2021-12-15 NOTE — Progress Notes (Signed)
Release letter sent via email to Dr. Luan Pulling at [facility] stating that patient may undergo needed dental procedures per Dr. Delton Coombes. Letter sent to [email or web address]

## 2021-12-31 ENCOUNTER — Other Ambulatory Visit: Payer: Self-pay

## 2021-12-31 ENCOUNTER — Inpatient Hospital Stay (HOSPITAL_COMMUNITY): Payer: 59 | Attending: Hematology

## 2021-12-31 DIAGNOSIS — Z8041 Family history of malignant neoplasm of ovary: Secondary | ICD-10-CM | POA: Insufficient documentation

## 2021-12-31 DIAGNOSIS — C187 Malignant neoplasm of sigmoid colon: Secondary | ICD-10-CM

## 2021-12-31 DIAGNOSIS — Z95828 Presence of other vascular implants and grafts: Secondary | ICD-10-CM

## 2021-12-31 DIAGNOSIS — C20 Malignant neoplasm of rectum: Secondary | ICD-10-CM

## 2021-12-31 DIAGNOSIS — Z9221 Personal history of antineoplastic chemotherapy: Secondary | ICD-10-CM | POA: Diagnosis not present

## 2021-12-31 DIAGNOSIS — Z79899 Other long term (current) drug therapy: Secondary | ICD-10-CM | POA: Insufficient documentation

## 2021-12-31 DIAGNOSIS — Z87891 Personal history of nicotine dependence: Secondary | ICD-10-CM | POA: Diagnosis not present

## 2021-12-31 DIAGNOSIS — C19 Malignant neoplasm of rectosigmoid junction: Secondary | ICD-10-CM | POA: Insufficient documentation

## 2021-12-31 DIAGNOSIS — Z8042 Family history of malignant neoplasm of prostate: Secondary | ICD-10-CM | POA: Diagnosis not present

## 2021-12-31 DIAGNOSIS — G629 Polyneuropathy, unspecified: Secondary | ICD-10-CM | POA: Diagnosis not present

## 2021-12-31 LAB — COMPREHENSIVE METABOLIC PANEL
ALT: 22 U/L (ref 0–44)
AST: 24 U/L (ref 15–41)
Albumin: 4.3 g/dL (ref 3.5–5.0)
Alkaline Phosphatase: 94 U/L (ref 38–126)
Anion gap: 10 (ref 5–15)
BUN: 15 mg/dL (ref 6–20)
CO2: 23 mmol/L (ref 22–32)
Calcium: 9.5 mg/dL (ref 8.9–10.3)
Chloride: 101 mmol/L (ref 98–111)
Creatinine, Ser: 0.94 mg/dL (ref 0.44–1.00)
GFR, Estimated: >60 mL/min (ref >60)
Glucose, Bld: 95 mg/dL (ref 70–99)
Potassium: 4 mmol/L (ref 3.5–5.1)
Sodium: 134 mmol/L — ABNORMAL LOW (ref 135–145)
Total Bilirubin: 0.7 mg/dL (ref 0.3–1.2)
Total Protein: 7.2 g/dL (ref 6.5–8.1)

## 2021-12-31 LAB — CBC WITH DIFFERENTIAL/PLATELET
Abs Immature Granulocytes: 0.04 10*3/uL (ref 0.00–0.07)
Basophils Absolute: 0.1 10*3/uL (ref 0.0–0.1)
Basophils Relative: 1 %
Eosinophils Absolute: 0 10*3/uL (ref 0.0–0.5)
Eosinophils Relative: 0 %
HCT: 33.6 % — ABNORMAL LOW (ref 36.0–46.0)
Hemoglobin: 11.3 g/dL — ABNORMAL LOW (ref 12.0–15.0)
Immature Granulocytes: 0 %
Lymphocytes Relative: 18 %
Lymphs Abs: 1.8 10*3/uL (ref 0.7–4.0)
MCH: 37.8 pg — ABNORMAL HIGH (ref 26.0–34.0)
MCHC: 33.6 g/dL (ref 30.0–36.0)
MCV: 112.4 fL — ABNORMAL HIGH (ref 80.0–100.0)
Monocytes Absolute: 1.3 10*3/uL — ABNORMAL HIGH (ref 0.1–1.0)
Monocytes Relative: 14 %
Neutro Abs: 6.4 10*3/uL (ref 1.7–7.7)
Neutrophils Relative %: 67 %
Platelets: 237 10*3/uL (ref 150–400)
RBC: 2.99 MIL/uL — ABNORMAL LOW (ref 3.87–5.11)
RDW: 16 % — ABNORMAL HIGH (ref 11.5–15.5)
WBC: 9.6 10*3/uL (ref 4.0–10.5)
nRBC: 0 % (ref 0.0–0.2)

## 2021-12-31 MED ORDER — SODIUM CHLORIDE 0.9% FLUSH
10.0000 mL | Freq: Once | INTRAVENOUS | Status: AC
  Administered 2021-12-31: 10 mL

## 2021-12-31 MED ORDER — HEPARIN SOD (PORK) LOCK FLUSH 100 UNIT/ML IV SOLN
500.0000 [IU] | Freq: Once | INTRAVENOUS | Status: AC
  Administered 2021-12-31: 500 [IU] via INTRAVENOUS

## 2021-12-31 NOTE — Progress Notes (Signed)
Alexandra Bright presented for Portacath access and flush.  Portacath located right chest wall accessed with  H 20Good blood return present. Labs drawn with no difficulty. Portacath flushed with 10 ml NS and 500U/78ml Heparin and needle removed intact.  Procedure tolerated well and without incident. Treatment given today per MD orders. Tolerated infusion without adverse affects. Discharged from clinic ambulatory in stable condition. Alert and oriented x 3. F/U with [facility] as scheduled.

## 2022-01-01 LAB — CEA: CEA: 2.8 ng/mL (ref 0.0–4.7)

## 2022-01-05 ENCOUNTER — Other Ambulatory Visit (HOSPITAL_COMMUNITY): Payer: 59

## 2022-01-05 ENCOUNTER — Inpatient Hospital Stay (HOSPITAL_BASED_OUTPATIENT_CLINIC_OR_DEPARTMENT_OTHER): Payer: 59 | Admitting: Hematology

## 2022-01-05 ENCOUNTER — Other Ambulatory Visit: Payer: Self-pay

## 2022-01-05 VITALS — BP 123/92 | HR 103 | Temp 96.9°F | Resp 18 | Ht 61.0 in | Wt 131.1 lb

## 2022-01-05 DIAGNOSIS — C19 Malignant neoplasm of rectosigmoid junction: Secondary | ICD-10-CM | POA: Diagnosis not present

## 2022-01-05 DIAGNOSIS — C20 Malignant neoplasm of rectum: Secondary | ICD-10-CM

## 2022-01-05 NOTE — Progress Notes (Signed)
[name], [place] [address]   CLINIC:  Medical Oncology/Hematology  PCP:  Lindell Spar, MD [address] / [place] [address] [phone]   REASON FOR VISIT:  Follow-up for sigmoid adenocarcinoma  PRIOR THERAPY: none  NGS Results: not done  CURRENT THERAPY: FOLFOX every 2 weeks x 4 months  BRIEF ONCOLOGIC HISTORY:  Oncology History  Rectal cancer ([facility])  04/08/2021 Initial Diagnosis   Rectal cancer ([facility])   04/20/2021 - 04/20/2021 Chemotherapy          08/18/2021 -  Chemotherapy   Patient is on Treatment Plan : COLORECTAL FOLFOX q14d x 4 months       CANCER STAGING:  Cancer Staging  Rectal cancer ([facility]) Staging form: Colon and Rectum, AJCC 8th Edition - Clinical stage from 04/08/2021: Stage IIIB (cT3, cN1b, cM0) - Unsigned   INTERVAL HISTORY:  [name], a 56 y.o. female, returns for routine follow-up of her sigmoid adenocarcinoma. Alexandra Bright was last seen on 12/07/2021.   Today she reports feeling good. She denies numbness/tingling. Her energy levels are good.   REVIEW OF SYSTEMS:  Review of Systems  Constitutional:  Negative for appetite change and fatigue.  HENT:   Positive for trouble swallowing (due to teeth).   Neurological:  Negative for numbness.  All other systems reviewed and are negative.  PAST MEDICAL/SURGICAL HISTORY:  Past Medical History:  Diagnosis Date   Anxiety    Colon cancer ([facility]) 01/2021   Constipation    GERD (gastroesophageal reflux disease)    Hypertension    Port-A-Cath in place 08/17/2021   Pre-diabetes    Past Surgical History:  Procedure Laterality Date   BIOPSY  02/18/2021   Procedure: BIOPSY;  Surgeon: Harvel Quale, MD;  [facility];  Service: Gastroenterology;;   FACIAL FRACTURE SURGERY  1991   from [facility]  02/18/2021   Procedure: FLEXIBLE SIGMOIDOSCOPY;  Surgeon: Harvel Quale, MD;  Location: [facility];   Service: Gastroenterology;;   IR IMAGING GUIDED PORT INSERTION  08/13/2021   POLYPECTOMY  02/18/2021   Procedure: POLYPECTOMY INTESTINAL;  Surgeon: Harvel Quale, MD;  Location[facility];  Service: Gastroenterology;;   SUBMUCOSAL TATTOO INJECTION  02/18/2021   Procedure: SUBMUCOSAL TATTOO INJECTION;  Surgeon: Harvel Quale, MD;  Location: [facility];  Service: Gastroenterology;;   XI ROBOTIC ASSISTED LOWER ANTERIOR RESECTION N/A 07/10/2021   Procedure: XI ROBOTIC ASSISTED LOWER ANTERIOR RESECTION;  Surgeon: Leighton Ruff, MD;  Location: [facility];  Service: General;  Laterality: N/A;    SOCIAL HISTORY:  Social History   Socioeconomic History   Marital status: Married    Spouse name: Not on file   Number of children: Not on file   Years of education: Not on file   Highest education level: Not on file  Occupational History   Not on file  Tobacco Use   Smoking status: Former    Packs/day: 0.50    Years: 40.00    Pack years: 20.00    Types: Cigarettes    Quit date: 2020    Years since quitting: 3.1   Smokeless tobacco: Never  Vaping Use   Vaping Use: Never used  Substance and Sexual Activity   Alcohol use: Not Currently   Drug use: Never   Sexual activity: Not Currently  Other Topics Concern   Not on file  Social History Narrative   Not on file  Social Determinants of Health   Financial Resource Strain: Low Risk    Difficulty of Paying Living Expenses: Not hard at all  Food Insecurity: No Food Insecurity   Worried About Charity fundraiser in the Last Year: Never true   [place] in the Last Year: Never true  Transportation Needs: No Transportation Needs   Lack of Transportation (Medical): No   Lack of Transportation (Non-Medical): No  Physical Activity: Insufficiently Active   Days of Exercise per Week: 3 days   Minutes of Exercise per Session: 20 min  Stress: No Stress Concern Present   Feeling of Stress : Only a little  Social  Connections: Engineer, building services of Communication with Friends and Family: More than three times a week   Frequency of Social Gatherings with Friends and Family: More than three times a week   Attends Religious Services: More than 4 times per year   Active Member of [employer] or Organizations: Yes   Attends Music therapist: More than 4 times per year   Marital Status: Married  Human resources officer Violence: Not At Risk   Fear of Current or Ex-Partner: No   Emotionally Abused: No   Physically Abused: No   Sexually Abused: No    FAMILY HISTORY:  Family History  Problem Relation Age of Onset   Diabetes Sister    Cancer Maternal Aunt    Ovarian cancer Cousin    Ovarian cancer Paternal Aunt    Prostate cancer Cousin     CURRENT MEDICATIONS:  Current Outpatient Medications  Medication Sig Dispense Refill   benzocaine (ORAJEL) 10 % mucosal gel Use as directed 1 application in the mouth or throat as needed for mouth pain.     buPROPion (WELLBUTRIN SR) 200 MG 12 hr tablet Take 1 tablet (200 mg total) by mouth 2 (two) times daily. 60 tablet 5   chlorhexidine (PERIDEX) 0.12 % solution 15 mLs 2 (two) times daily.     dicyclomine (BENTYL) 10 MG capsule Take 10 mg by mouth 2 (two) times daily as needed for spasms.     diphenhydrAMINE (BENADRYL) 25 MG tablet Take 25 mg by mouth every 6 (six) hours as needed for allergies.     doxycycline (VIBRA-TABS) 100 MG tablet Take 100 mg by mouth 2 (two) times daily.     famotidine (PEPCID) 20 MG tablet Take 1 tablet (20 mg total) by mouth 2 (two) times daily. 30 tablet 0   fluorouracil CALGB 59563 2,400 mg/m2 in sodium chloride 0.9 % 150 mL Inject 2,400 mg/m2 into the vein over 48 hr.     FLUOROURACIL IV Inject into the vein every 14 (fourteen) days.     LEUCOVORIN CALCIUM IV Inject into the vein every 14 (fourteen) days.     loperamide (IMODIUM) 1 MG/5ML solution Take 2 mg by mouth as needed for diarrhea or loose stools.     losartan  (COZAAR) 50 MG tablet Take 1 tablet by mouth once daily 90 tablet 0   Multiple Vitamins-Minerals (MULTIVITAMIN WITH MINERALS) tablet Take 1 tablet by mouth in the morning. Women's Multivitamin     OXALIPLATIN IV Inject into the vein every 14 (fourteen) days.     potassium chloride SA (KLOR-CON) 20 MEQ tablet Take 1 tablet (20 mEq total) by mouth daily. 30 tablet 3   sennosides-docusate sodium (SENOKOT-S) 8.6-50 MG tablet Take 1 tablet by mouth daily as needed for constipation.     simethicone (MYLICON) 80 MG chewable  tablet Chew 80 mg by mouth every 6 (six) hours as needed for flatulence.     sucralfate (CARAFATE) 1 g tablet Take 1 tablet (1 g total) by mouth 4 (four) times daily -  with meals and at bedtime. 21 tablet 0   traMADol (ULTRAM) 50 MG tablet Take 1 tablet (50 mg total) by mouth daily as needed. 60 tablet 0   lidocaine-prilocaine (EMLA) cream Apply a small amount to port a cath site and cover with plastic wrap 1 hour prior to chemotherapy appointments (Patient not taking: Reported on 01/05/2022) 30 g 3   ondansetron (ZOFRAN) 8 MG tablet Take 1 tablet (8 mg total) by mouth every 8 (eight) hours as needed for nausea or vomiting. (Patient not taking: Reported on 01/05/2022) 20 tablet 2   prochlorperazine (COMPAZINE) 10 MG tablet Take 1 tablet (10 mg total) by mouth every 6 (six) hours as needed (Nausea or vomiting). (Patient not taking: Reported on 01/05/2022) 30 tablet 1   No current facility-administered medications for this visit.   Facility-Administered Medications Ordered in Other Visits  Medication Dose Route Frequency Provider Last Rate Last Admin   sodium chloride flush (NS) 0.9 % injection 10 mL  10 mL Intracatheter PRN Derek Jack, MD   10 mL at 09/03/21 1337    ALLERGIES:  No Known Allergies  PHYSICAL EXAM:  Performance status (ECOG): 1 - Symptomatic but completely ambulatory  Vitals:   01/05/22 1118  BP: (!) 123/92  Pulse: (!) 103  Resp: 18  Temp: (!) 96.9 F  (36.1 C)  SpO2: 100%   Wt Readings from Last 3 Encounters:  01/05/22 131 lb 1.6 oz (59.5 kg)  12/07/21 130 lb 9.6 oz (59.2 kg)  11/24/21 132 lb 6.4 oz (60.1 kg)   Physical Exam Vitals reviewed.  Constitutional:      Appearance: Normal appearance.  Cardiovascular:     Rate and Rhythm: Normal rate and regular rhythm.     Pulses: Normal pulses.     Heart sounds: Normal heart sounds.  Pulmonary:     Effort: Pulmonary effort is normal.     Breath sounds: Normal breath sounds.  Abdominal:     Palpations: Abdomen is soft. There is no hepatomegaly, splenomegaly or mass.     Tenderness: There is no abdominal tenderness.  Musculoskeletal:     Right lower leg: No edema.     Left lower leg: No edema.  Neurological:     General: No focal deficit present.     Mental Status: She is alert and oriented to person, place, and time.  Psychiatric:        Mood and Affect: Mood normal.        Behavior: Behavior normal.     LABORATORY DATA:  I have reviewed the labs as listed.  CBC Latest Ref Rng & Units 12/31/2021 12/07/2021 11/24/2021  WBC 4.0 - 10.5 K/uL 9.6 10.4 9.7  Hemoglobin 12.0 - 15.0 g/dL 11.3(L) 10.8(L) 10.1(L)  Hematocrit 36.0 - 46.0 % 33.6(L) 32.3(L) 30.9(L)  Platelets 150 - 400 K/uL 237 172 202   CMP Latest Ref Rng & Units 12/31/2021 12/07/2021 11/24/2021  Glucose 70 - 99 mg/dL 95 101(H) 100(H)  BUN 6 - 20 mg/dL 15 12 9   Creatinine 0.44 - 1.00 mg/dL 0.94 0.74 0.72  Sodium 135 - 145 mmol/L 134(L) 135 138  Potassium 3.5 - 5.1 mmol/L 4.0 3.9 3.9  Chloride 98 - 111 mmol/L 101 106 105  CO2 22 - 32 mmol/L 23 23 25  Calcium 8.9 - 10.3 mg/dL 9.5 9.2 9.4  Total Protein 6.5 - 8.1 g/dL 7.2 7.2 7.2  Total Bilirubin 0.3 - 1.2 mg/dL 0.7 0.4 0.4  Alkaline Phos 38 - 126 U/L 94 122 120  AST 15 - 41 U/L 24 47(H) 29  ALT 0 - 44 U/L 22 60(H) 33    DIAGNOSTIC IMAGING:  I have independently reviewed the scans and discussed with the patient. No results found.   ASSESSMENT:  1.  Rectosigmoid  sigmoid adenocarcinoma: - Presentation with the bleeding per rectum for the last 5 to 6 months and constipation. - Colonoscopy on 02/18/2021 showed ulcerated partially obstructing mass found from 12-17 cm from the anal verge, circumferential measuring 5 cm in length. - Biopsy consistent with adenocarcinoma. - CT CAP on 03/06/2021 with circumferential wall thickening, hyperenhancement and fat stranding about the rectosigmoid junction, mass measuring approximately 6 cm in length.  No evidence of lymphadenopathy or metastatic disease. - CEA on 02/19/2021 was 1.8. - MRI of the pelvis on 04/06/2021 shows high rectal T3c/T4AN1 malignancy. - Chemoradiation therapy with Xeloda from 04/22/2021 through 06/01/2021. - CT CAP on 06/29/2021 showed improvement in the rectosigmoid thickening at the site of malignancy.  No clear adenopathy.  No evidence of metastatic disease. - Robotic assisted LAR on 07/10/2021 with pathology showing 2 cm grade 2, margins negative, 1/18 lymph nodes positive, no tumor deposits, positive treatment effect, no perineural invasion, no lymphovascular invasion, [id number], MMR preserved - She will need completion colonoscopy after adjuvant therapy. - 8 cycles of FOLFOX completed on 12/07/2021.   2.  Social/family history: - She worked as a Secretary/administrator and recently stopped working.  She quit smoking in October 2021, smoked 1 pack/day for 40 years. - Maternal aunt had lung cancer and was a non-smoker.  Paternal aunt had cancer, type not known to the patient.   PLAN:  1.  T3c/T4N1 rectosigmoid  adenocarcinoma: - She has completed adjuvant chemotherapy about a month ago.  She reports improvement in energy levels. - Bowels are also moving good. - Reviewed labs today which showed normal LFTs.  CBC also shows improvement in hemoglobin.  CEA was 2.8. - We discussed surveillance plan with CEA and labs every 3 months and CT AP every 6 months for the first 2 years.  She will have port flushed every 3  months. - We will arrange for follow-up in 3 months with repeat CT [facility] and CEA. - She will need completion colonoscopy.   2.  Peripheral neuropathy: - She does not report any tingling or numbness at this time.    Orders placed this encounter:  Orders Placed This Encounter  Procedures   CT Abdomen Pelvis W Contrast   CT Abdomen Pelvis W Contrast     Derek Jack, MD [place] [phone]   I, Thana Ates, am acting as a scribe for Dr. Derek Jack.  I, Derek Jack MD, have reviewed the above documentation for accuracy and completeness, and I agree with the above.

## 2022-01-05 NOTE — Patient Instructions (Addendum)
[place] at [facility] Discharge Instructions   You were seen and examined today by Dr. Delton Coombes.  He reviewed the results of your lab work which is normal/stable.   We will arrange for a CT scan prior to your next visit.   Return as scheduled for lab work and office visit.     Thank you for choosing [facility] at [facility] to provide your oncology and hematology care.  To afford each patient quality time with our provider, please arrive at least 15 minutes before your scheduled appointment time.   If you have a lab appointment with the [facility] please come in thru the Main Entrance and check in at the main information desk.  You need to re-schedule your appointment should you arrive 10 or more minutes late.  We strive to give you quality time with our providers, and arriving late affects you and other patients whose appointments are after yours.  Also, if you no show three or more times for appointments you may be dismissed from the clinic at the providers discretion.     Again, thank you for choosing [facility].  Our hope is that these requests will decrease the amount of time that you wait before being seen by our physicians.       _____________________________________________________________  Should you have questions after your visit to [facility], please contact our office at [phone] and follow the prompts.  Our office hours are 8:00 a.m. and 4:30 p.m. Monday - Friday.  Please note that voicemails left after 4:00 p.m. may not be returned until the following business day.  We are closed weekends and major holidays.  You do have access to a nurse 24-7, just call the main number to the clinic [phone] and do not press any options, hold on the line and a nurse will answer the phone.    For prescription refill requests, have your pharmacy contact our office and allow 72 hours.    Due to Covid,  you will need to wear a mask upon entering the hospital. If you do not have a mask, a mask will be given to you at the Main Entrance upon arrival. For doctor visits, patients may have 1 support person age 82 or older with them. For treatment visits, patients can not have anyone with them due to social distancing guidelines and our immunocompromised population.

## 2022-01-06 ENCOUNTER — Encounter (INDEPENDENT_AMBULATORY_CARE_PROVIDER_SITE_OTHER): Payer: Self-pay | Admitting: *Deleted

## 2022-01-25 ENCOUNTER — Encounter: Payer: Self-pay | Admitting: Adult Health

## 2022-01-25 ENCOUNTER — Ambulatory Visit: Payer: 59 | Admitting: Adult Health

## 2022-01-25 ENCOUNTER — Other Ambulatory Visit: Payer: Self-pay

## 2022-01-25 ENCOUNTER — Other Ambulatory Visit (HOSPITAL_COMMUNITY)
Admission: RE | Admit: 2022-01-25 | Discharge: 2022-01-25 | Disposition: A | Discharge status: Home / Self Care | Payer: 59 | Source: Ambulatory Visit | Attending: Adult Health | Admitting: Adult Health

## 2022-01-25 VITALS — BP 106/79 | HR 100 | Ht 61.0 in | Wt 134.0 lb

## 2022-01-25 DIAGNOSIS — Z124 Encounter for screening for malignant neoplasm of cervix: Secondary | ICD-10-CM | POA: Diagnosis not present

## 2022-01-25 DIAGNOSIS — Z30432 Encounter for removal of intrauterine contraceptive device: Secondary | ICD-10-CM | POA: Diagnosis not present

## 2022-01-25 DIAGNOSIS — N6314 Unspecified lump in the right breast, lower inner quadrant: Secondary | ICD-10-CM | POA: Diagnosis not present

## 2022-01-25 DIAGNOSIS — Z1151 Encounter for screening for human papillomavirus (HPV): Secondary | ICD-10-CM | POA: Insufficient documentation

## 2022-01-25 DIAGNOSIS — Z780 Asymptomatic menopausal state: Secondary | ICD-10-CM | POA: Diagnosis not present

## 2022-01-25 DIAGNOSIS — Z01419 Encounter for gynecological examination (general) (routine) without abnormal findings: Secondary | ICD-10-CM | POA: Diagnosis present

## 2022-01-25 NOTE — Progress Notes (Signed)
Patient ID: Alexandra Bright, female   DOB: 04-17-1966, 56 y.o.   MRN: [id number] ?History of Present Illness: ?Alexandra Bright is a 56 year old white female, married, PM in for IUD removal and has burning in left breast. She had chemo and radiation for colon and rectal cancer. It was found last year and just finished treatments. ?She needs a pap. ?PCP is Dr Posey Pronto ? ? ?Current Medications, Allergies, Past Medical History, Past Surgical History, Family History and Social History were reviewed in [employer] record.   ? ? ?Review of Systems: ?For IUD removal ?Denies any vaginal bleeding ?She is sexually active, without complaints ?+burning left breast ? ? ? ? ?Physical Exam: ?BP 106/79 (BP Location: Right Arm, Patient Position: Sitting, Cuff Size: Normal)   Pulse 100   Ht '5\' 1"'$  (1.549 m)   Wt 134 lb (60.8 kg)   BMI 25.32 kg/m?  Consent signed for IUD removal. ?General:  Well developed, well nourished, no acute distress ?Skin:  Warm and dry ?Neck:  Midline trachea, normal thyroid, good ROM, no lymphadenopathy ?Lungs; Clear to auscultation bilaterally ?Breast:  No dominant palpable mass, retraction, or nipple discharge, on the left and has node at 4 0' clock 4 FB from nipple on right, no retraction or nipple discharge. Has port ca cath in place right upper chest ?Cardiovascular: Regular rate and rhythm ?Pelvic:  External genitalia is normal in appearance, no lesions.  The vagina is pale. Urethra has no lesions or masses. The cervix is smooth, pap with HR HPV genotyping performed, IUD strings grasped with forceps and asked pt to cough and IUD easily removed.  Uterus is felt to be normal size, shape, and contour.  No adnexal masses or tenderness noted.Bladder is non tender, no masses felt. ?Rectal: Deferred ?Extremities/musculoskeletal:  No swelling or varicosities noted, no clubbing or cyanosis ?Psych:  No mood changes, alert and cooperative,seems happy ?AA is 0 ?Fall risk is low ?Depression screen [facility] 2/9  01/25/2022 08/17/2021 02/26/2021  ?Decreased Interest 0 0 0  ?Down, Depressed, Hopeless 0 0 1  ?PHQ - 2 Score 0 0 1  ?Altered sleeping 2 - -  ?Tired, decreased energy 1 - -  ?Change in appetite 0 - -  ?Feeling bad or failure about yourself  0 - -  ?Trouble concentrating 0 - -  ?Moving slowly or fidgety/restless 0 - -  ?Suicidal thoughts 0 - -  ?PHQ-9 Score 3 - -  ?  ?GAD 7 : Generalized Anxiety Score 01/25/2022  ?Nervous, Anxious, on Edge 0  ?Control/stop worrying 0  ?Worry too much - different things 0  ?Trouble relaxing 0  ?Restless 0  ?Easily annoyed or irritable 0  ?Afraid - awful might happen 0  ?Total GAD 7 Score 0  ? ?  ? [place] - 01/25/22 1019   ? ?  ? Pregnancy Intention Screening  ? Does the patient want to become pregnant in the next year? No   ? Does the patient's partner want to become pregnant in the next year? No   ? Would the patient like to discuss contraceptive options today? No   ?  ? Contraception Wrap Up  ? Current Method IUD or IUS   PM  ? End Method --   PM  ? Contraception Counseling Provided No   ? ?  ?  ? ?  ? Examination chaperoned by Celene Squibb LPN ? ?Impression and Plan: ?1. Routine cervical smear ?Pap sent ?Pap in 3 years if normal ?Physical  with PCP ? ? ?2. Encounter for IUD removal ? ?3. Mass of lower inner quadrant of right breast ?Diagnostic mammogram and [place] scheduled for her 02/11/22 at 10:20 am at [facility]  ?Follow up prn ? ?4. Postmenopause ? ? ? ? ?  ?  ?

## 2022-01-28 ENCOUNTER — Ambulatory Visit (INDEPENDENT_AMBULATORY_CARE_PROVIDER_SITE_OTHER): Payer: 59 | Admitting: Gastroenterology

## 2022-01-28 LAB — CYTOLOGY - PAP
Comment: NEGATIVE
Diagnosis: UNDETERMINED — AB
High risk HPV: NEGATIVE

## 2022-02-01 ENCOUNTER — Encounter: Payer: Self-pay | Admitting: Adult Health

## 2022-02-01 DIAGNOSIS — R87610 Atypical squamous cells of undetermined significance on cytologic smear of cervix (ASC-US): Secondary | ICD-10-CM | POA: Insufficient documentation

## 2022-02-01 HISTORY — DX: Atypical squamous cells of undetermined significance on cytologic smear of cervix (ASC-US): R87.610

## 2022-02-08 ENCOUNTER — Other Ambulatory Visit (HOSPITAL_COMMUNITY): Payer: Self-pay

## 2022-02-09 ENCOUNTER — Encounter (HOSPITAL_COMMUNITY): Payer: Self-pay | Admitting: Hematology

## 2022-02-09 MED ORDER — GABAPENTIN 300 MG PO CAPS: 300.0000 mg | ORAL_CAPSULE | Freq: Three times a day (TID) | ORAL | 1 refills | Status: DC

## 2022-02-09 MED ORDER — TRAMADOL HCL 50 MG PO TABS: 50.0000 mg | ORAL_TABLET | Freq: Every day | ORAL | 0 refills | Status: DC | PRN

## 2022-02-10 ENCOUNTER — Encounter (HOSPITAL_COMMUNITY): Payer: Self-pay | Admitting: *Deleted

## 2022-02-10 ENCOUNTER — Telehealth (HOSPITAL_COMMUNITY): Payer: Self-pay | Admitting: *Deleted

## 2022-02-10 NOTE — Progress Notes (Signed)
Received approval from patient's plan for Tramadol 50 mg tablets starting 02/10/22-08/13/22. Case ID [id number] (plan contact # [phone]). Pharmacy made aware and was able to run medication for $0 copay. ?

## 2022-02-10 NOTE — Telephone Encounter (Signed)
Opened in error

## 2022-02-11 ENCOUNTER — Encounter (INDEPENDENT_AMBULATORY_CARE_PROVIDER_SITE_OTHER): Payer: Self-pay | Admitting: Gastroenterology

## 2022-02-11 ENCOUNTER — Ambulatory Visit (HOSPITAL_COMMUNITY)
Admission: RE | Admit: 2022-02-11 | Discharge: 2022-02-11 | Disposition: A | Discharge status: Home / Self Care | Payer: 59 | Source: Ambulatory Visit | Attending: Adult Health | Admitting: Adult Health

## 2022-02-11 ENCOUNTER — Other Ambulatory Visit: Payer: Self-pay

## 2022-02-11 ENCOUNTER — Other Ambulatory Visit (INDEPENDENT_AMBULATORY_CARE_PROVIDER_SITE_OTHER): Payer: Self-pay

## 2022-02-11 ENCOUNTER — Telehealth (INDEPENDENT_AMBULATORY_CARE_PROVIDER_SITE_OTHER): Payer: Self-pay

## 2022-02-11 ENCOUNTER — Encounter (INDEPENDENT_AMBULATORY_CARE_PROVIDER_SITE_OTHER): Payer: Self-pay

## 2022-02-11 ENCOUNTER — Ambulatory Visit (INDEPENDENT_AMBULATORY_CARE_PROVIDER_SITE_OTHER): Payer: 59 | Admitting: Gastroenterology

## 2022-02-11 VITALS — BP 103/56 | HR 88 | Temp 97.8°F | Ht 61.0 in | Wt 133.2 lb

## 2022-02-11 DIAGNOSIS — C20 Malignant neoplasm of rectum: Secondary | ICD-10-CM | POA: Diagnosis not present

## 2022-02-11 DIAGNOSIS — N6314 Unspecified lump in the right breast, lower inner quadrant: Secondary | ICD-10-CM | POA: Diagnosis present

## 2022-02-11 DIAGNOSIS — K581 Irritable bowel syndrome with constipation: Secondary | ICD-10-CM

## 2022-02-11 MED ORDER — NA SULFATE-K SULFATE-MG SULF 17.5-3.13-1.6 GM/177ML PO SOLN: 1.0000 | Freq: Once | ORAL | 0 refills | Status: AC

## 2022-02-11 NOTE — Telephone Encounter (Signed)
Quandarius Nill Ann Terry Abila, CMA  ?
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
## 2022-02-11 NOTE — Patient Instructions (Signed)
Schedule colonoscopy ?Follow up with Dr. Delton Coombes regarding rectal cancer surveillance ?Start taking Miralax 1 capful every day for one week. If bowel movements do not improve, increase to 1 capful every 12 hours. If after two weeks there is no improvement, increase to 1 capful every 8 hours ? ?
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
## 2022-02-15 ENCOUNTER — Ambulatory Visit (INDEPENDENT_AMBULATORY_CARE_PROVIDER_SITE_OTHER): Payer: 59 | Admitting: Internal Medicine

## 2022-02-15 ENCOUNTER — Encounter: Payer: Self-pay | Admitting: Internal Medicine

## 2022-02-15 ENCOUNTER — Other Ambulatory Visit: Payer: Self-pay

## 2022-02-15 VITALS — BP 124/80 | HR 82 | Ht 61.0 in | Wt 136.0 lb

## 2022-02-15 DIAGNOSIS — Z1159 Encounter for screening for other viral diseases: Secondary | ICD-10-CM | POA: Diagnosis not present

## 2022-02-15 DIAGNOSIS — D539 Nutritional anemia, unspecified: Secondary | ICD-10-CM | POA: Diagnosis not present

## 2022-02-15 DIAGNOSIS — I10 Essential (primary) hypertension: Secondary | ICD-10-CM

## 2022-02-15 DIAGNOSIS — F419 Anxiety disorder, unspecified: Secondary | ICD-10-CM

## 2022-02-15 DIAGNOSIS — E538 Deficiency of other specified B group vitamins: Secondary | ICD-10-CM | POA: Diagnosis not present

## 2022-02-15 DIAGNOSIS — C20 Malignant neoplasm of rectum: Secondary | ICD-10-CM

## 2022-02-15 DIAGNOSIS — Z0001 Encounter for general adult medical examination with abnormal findings: Secondary | ICD-10-CM

## 2022-02-15 DIAGNOSIS — G609 Hereditary and idiopathic neuropathy, unspecified: Secondary | ICD-10-CM

## 2022-02-15 MED ORDER — CYANOCOBALAMIN 1000 MCG/ML IJ SOLN
1000.0000 ug | Freq: Once | INTRAMUSCULAR | Status: AC
  Administered 2022-02-15: 1000 ug via INTRAMUSCULAR

## 2022-02-15 MED ORDER — BUPROPION HCL ER (SR) 200 MG PO TB12: 200.0000 mg | ORAL_TABLET | Freq: Two times a day (BID) | ORAL | 5 refills | Status: DC
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
## 2022-02-15 NOTE — Patient Instructions (Addendum)
Please continue to take medications as prescribed. ? ?Please perform simple back exercises. ? ?Okay to take Tylenol for back pain. ?

## 2022-02-16 ENCOUNTER — Encounter (INDEPENDENT_AMBULATORY_CARE_PROVIDER_SITE_OTHER): Payer: Self-pay

## 2022-02-19 DIAGNOSIS — Z0001 Encounter for general adult medical examination with abnormal findings: Secondary | ICD-10-CM | POA: Insufficient documentation

## 2022-02-19 NOTE — Assessment & Plan Note (Addendum)
S/p radiation and LAR surgery ?Followed by oncology-  s/p chemotherapy, has Port-A-Cath in place ?Planned to get colonoscopy for surveillance by GI ?

## 2022-02-19 NOTE — Assessment & Plan Note (Signed)
Physical exam as documented. ?CBC and CMP reviewed from the chart, added TSH, hep C antibody and B12 levels. ?

## 2022-02-19 NOTE — Assessment & Plan Note (Signed)
BP Readings from Last 1 Encounters:  ?02/15/22 124/80  ? ?Well-controlled with Losartan ?Counseled for compliance with the medications ?Advised DASH diet and moderate exercise/walking, at least 150 mins/week ? ?

## 2022-02-19 NOTE — Assessment & Plan Note (Signed)
Well-controlled with Wellbutrin 200 mg twice daily ?

## 2022-02-19 NOTE — Assessment & Plan Note (Signed)
Started vitamin B12 injection as she did not have improvement with oral vitamin B12 supplement ?

## 2022-02-19 NOTE — Assessment & Plan Note (Signed)
Pins-and-needles sensation in bilateral UE and LE ?Continue gabapentin ?Her back pain could be due to DDD of lumbar spine and/or chronic constipation ?

## 2022-02-22 ENCOUNTER — Ambulatory Visit (INDEPENDENT_AMBULATORY_CARE_PROVIDER_SITE_OTHER): Payer: 59 | Admitting: Gastroenterology

## 2022-03-05 ENCOUNTER — Ambulatory Visit (HOSPITAL_COMMUNITY): Payer: 59 | Admitting: Anesthesiology

## 2022-03-05 ENCOUNTER — Ambulatory Visit (HOSPITAL_BASED_OUTPATIENT_CLINIC_OR_DEPARTMENT_OTHER): Payer: 59 | Admitting: Anesthesiology

## 2022-03-05 ENCOUNTER — Ambulatory Visit (HOSPITAL_COMMUNITY)
Admission: RE | Admit: 2022-03-05 | Discharge: 2022-03-05 | Disposition: A | Discharge status: Home / Self Care | Payer: 59 | Attending: Gastroenterology | Admitting: Gastroenterology

## 2022-03-05 ENCOUNTER — Encounter (HOSPITAL_COMMUNITY)
Admission: RE | Disposition: A | Discharge status: Home / Self Care | Payer: Self-pay | Source: Home / Self Care | Attending: Gastroenterology

## 2022-03-05 ENCOUNTER — Other Ambulatory Visit: Payer: Self-pay

## 2022-03-05 ENCOUNTER — Telehealth (INDEPENDENT_AMBULATORY_CARE_PROVIDER_SITE_OTHER): Payer: Self-pay | Admitting: Gastroenterology

## 2022-03-05 ENCOUNTER — Encounter (HOSPITAL_COMMUNITY): Payer: Self-pay | Admitting: Gastroenterology

## 2022-03-05 DIAGNOSIS — Z980 Intestinal bypass and anastomosis status: Secondary | ICD-10-CM | POA: Insufficient documentation

## 2022-03-05 DIAGNOSIS — I10 Essential (primary) hypertension: Secondary | ICD-10-CM | POA: Diagnosis not present

## 2022-03-05 DIAGNOSIS — K219 Gastro-esophageal reflux disease without esophagitis: Secondary | ICD-10-CM | POA: Diagnosis not present

## 2022-03-05 DIAGNOSIS — Z85038 Personal history of other malignant neoplasm of large intestine: Secondary | ICD-10-CM

## 2022-03-05 DIAGNOSIS — Z1211 Encounter for screening for malignant neoplasm of colon: Secondary | ICD-10-CM | POA: Diagnosis not present

## 2022-03-05 DIAGNOSIS — G709 Myoneural disorder, unspecified: Secondary | ICD-10-CM | POA: Insufficient documentation

## 2022-03-05 DIAGNOSIS — K6389 Other specified diseases of intestine: Secondary | ICD-10-CM | POA: Insufficient documentation

## 2022-03-05 DIAGNOSIS — F419 Anxiety disorder, unspecified: Secondary | ICD-10-CM | POA: Diagnosis not present

## 2022-03-05 DIAGNOSIS — K648 Other hemorrhoids: Secondary | ICD-10-CM | POA: Diagnosis not present

## 2022-03-05 DIAGNOSIS — F32A Depression, unspecified: Secondary | ICD-10-CM | POA: Diagnosis not present

## 2022-03-05 DIAGNOSIS — K580 Irritable bowel syndrome with diarrhea: Secondary | ICD-10-CM | POA: Diagnosis not present

## 2022-03-05 DIAGNOSIS — Z87891 Personal history of nicotine dependence: Secondary | ICD-10-CM | POA: Insufficient documentation

## 2022-03-05 DIAGNOSIS — Z79899 Other long term (current) drug therapy: Secondary | ICD-10-CM | POA: Diagnosis not present

## 2022-03-05 DIAGNOSIS — Z85048 Personal history of other malignant neoplasm of rectum, rectosigmoid junction, and anus: Secondary | ICD-10-CM | POA: Insufficient documentation

## 2022-03-05 HISTORY — PX: SUBMUCOSAL TATTOO INJECTION: SHX6856

## 2022-03-05 HISTORY — PX: BIOPSY: SHX5522

## 2022-03-05 HISTORY — PX: COLONOSCOPY WITH PROPOFOL: SHX5780

## 2022-03-05 LAB — HM COLONOSCOPY

## 2022-03-05 SURGERY — COLONOSCOPY WITH PROPOFOL
Anesthesia: General

## 2022-03-05 MED ORDER — PROPOFOL 500 MG/50ML IV EMUL
INTRAVENOUS | Status: DC | PRN
  Administered 2022-03-05: 150 ug/kg/min via INTRAVENOUS

## 2022-03-05 MED ORDER — SPOT INK MARKER SYRINGE KIT
PACK | SUBMUCOSAL | Status: DC | PRN
  Administered 2022-03-05: 1.5 mL via SUBMUCOSAL

## 2022-03-05 MED ORDER — PROPOFOL 10 MG/ML IV BOLUS
INTRAVENOUS | Status: DC | PRN
  Administered 2022-03-05: 100 mg via INTRAVENOUS
  Administered 2022-03-05: 30 mg via INTRAVENOUS
  Administered 2022-03-05: 20 mg via INTRAVENOUS

## 2022-03-05 MED ORDER — LIDOCAINE HCL (CARDIAC) PF 100 MG/5ML IV SOSY
PREFILLED_SYRINGE | INTRAVENOUS | Status: DC | PRN
Start: 2022-03-05 — End: 2022-03-05
  Administered 2022-03-05: 50 mg via INTRAVENOUS

## 2022-03-05 MED ORDER — SPOT INK MARKER SYRINGE KIT
PACK | SUBMUCOSAL | Status: AC
  Filled 2022-03-05: qty 5

## 2022-03-05 MED ORDER — LACTATED RINGERS IV SOLN: INTRAVENOUS | Status: DC

## 2022-03-05 NOTE — Telephone Encounter (Signed)
Christine from [facility] called stated patient needs to have a repeat flex sig in 6 months for surveillance  ?

## 2022-03-05 NOTE — Anesthesia Preprocedure Evaluation (Addendum)
Anesthesia Evaluation  ?Patient identified by MRN, date of birth, ID band ?Patient awake ? ? ? ?Reviewed: ?Allergy & Precautions, NPO status , Patient's Chart, lab work & pertinent test results ? ?Airway ?Mallampati: II ? ?TM Distance: >3 FB ?Neck ROM: Full ? ? ? Dental ? ?(+) Missing, Dental Advisory Given ?  ?Pulmonary ?neg pulmonary ROS, former smoker,  ?  ?Pulmonary exam normal ?breath sounds clear to auscultation ? ? ? ? ? ? Cardiovascular ?Exercise Tolerance: Good ?hypertension, Pt. on medications ?Normal cardiovascular exam ?Rhythm:Regular Rate:Normal ? ? ?  ?Neuro/Psych ?PSYCHIATRIC DISORDERS Anxiety Depression  Neuromuscular disease   ? GI/Hepatic ?Neg liver ROS, GERD  ,Colon cancer/resection  ?  ?Endo/Other  ?negative endocrine ROS ? Renal/GU ?negative Renal ROS  ?negative genitourinary ?  ?Musculoskeletal ?negative musculoskeletal ROS ?(+)  ? Abdominal ?  ?Peds ?negative pediatric ROS ?(+)  Hematology ? ?(+) Blood dyscrasia, anemia ,   ?Anesthesia Other Findings ? ? Reproductive/Obstetrics ?negative OB ROS ? ?  ? ? ? ? ? ? ? ? ? ? ? ? ? ?  ?  ? ? ? ? ? ? ? ?Anesthesia Physical ?Anesthesia Plan ? ?ASA: 2 ? ?Anesthesia Plan: General  ? ?Post-op Pain Management: Minimal or no pain anticipated  ? ?Induction: Intravenous ? ?PONV Risk Score and Plan: Propofol infusion ? ?Airway Management Planned: Nasal Cannula and Natural Airway ? ?Additional Equipment:  ? ?Intra-op Plan:  ? ?Post-operative Plan:  ? ?Informed Consent: I have reviewed the patients History and Physical, chart, labs and discussed the procedure including the risks, benefits and alternatives for the proposed anesthesia with the patient or authorized representative who has indicated his/her understanding and acceptance.  ? ? ? ?Dental advisory given ? ?Plan Discussed with: CRNA and Surgeon ? ?Anesthesia Plan Comments:   ? ? ? ? ? ? ?Anesthesia Quick Evaluation ? ?

## 2022-03-05 NOTE — Interval H&P Note (Signed)
History and Physical Interval Note: ? ?03/05/2022 ?7:29 AM ? ?Alexandra Bright  has presented today for surgery, with the diagnosis of screening colonoscopy.  The various methods of treatment have been discussed with the patient and family. After consideration of risks, benefits and other options for treatment, the patient has consented to  Procedure(s) with comments: ?COLONOSCOPY WITH PROPOFOL (N/A) - 1040 ASA 1 as a surgical intervention.  The patient's history has been reviewed, patient examined, no change in status, stable for surgery.  I have reviewed the patient's chart and labs.  Questions were answered to the patient's satisfaction.   ? ? ?[name] ? ? ?

## 2022-03-05 NOTE — Transfer of Care (Signed)
Immediate Anesthesia Transfer of Care Note ? ?Patient: Alexandra Bright ? ?Procedure(s) Performed: COLONOSCOPY WITH PROPOFOL ?BIOPSY ?SUBMUCOSAL TATTOO INJECTION ? ?Patient Location: Endoscopy Unit ? ?Anesthesia Type:General ? ?Level of Consciousness: awake ? ?Airway & Oxygen Therapy: Patient Spontanous Breathing ? ?Post-op Assessment: Report given to RN and Post -op Vital signs reviewed and stable ? ?Post vital signs: Reviewed and stable ? ?Last Vitals:  ?Vitals Value Taken Time  ?BP    ?Temp    ?Pulse 82   ?Resp 20   ?SpO2 97%   ? ? ?Last Pain:  ?Vitals:  ? 03/05/22 0824  ?TempSrc:   ?PainSc: 0-No pain  ?   ? ?Patients Stated Pain Goal: 5 (03/05/22 0731) ? ?Complications: No notable events documented. ?

## 2022-03-05 NOTE — Discharge Instructions (Addendum)
You are being discharged to home.  ?Resume your previous diet.  ?We are waiting for your pathology results.  ?Your physician has recommended a repeat flexible sigmoidoscopy in six months for surveillance.  OFFICE NOTIFIED OF NEED OF FUTURE PROCEDURE  ?

## 2022-03-05 NOTE — Anesthesia Postprocedure Evaluation (Signed)
Anesthesia Post Note ? ?Patient: Alexandra Bright ? ?Procedure(s) Performed: COLONOSCOPY WITH PROPOFOL ?BIOPSY ?SUBMUCOSAL TATTOO INJECTION ? ?Patient location during evaluation: Endoscopy ?Anesthesia Type: General ?Level of consciousness: awake and alert and oriented ?Pain management: pain level controlled ?Vital Signs Assessment: post-procedure vital signs reviewed and stable ?Respiratory status: spontaneous breathing, nonlabored ventilation and respiratory function stable ?Cardiovascular status: blood pressure returned to baseline and stable ?Postop Assessment: no apparent nausea or vomiting ?Anesthetic complications: no ? ? ?No notable events documented. ? ? ?Last Vitals:  ?Vitals:  ? 03/05/22 0731 03/05/22 0904  ?BP: 121/80 96/61  ?Pulse:  85  ?Resp: 14 16  ?Temp: 36.7 ?C 36.5 ?C  ?SpO2: 97% 97%  ?  ?Last Pain:  ?Vitals:  ? 03/05/22 0904  ?TempSrc: Oral  ?PainSc: 0-No pain  ? ? ?  ?  ?  ?  ?  ?  ? ?Deania Siguenza C Hadja Harral ? ? ? ? ?
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
## 2022-03-08 ENCOUNTER — Encounter (INDEPENDENT_AMBULATORY_CARE_PROVIDER_SITE_OTHER): Payer: Self-pay | Admitting: *Deleted

## 2022-03-08 LAB — SURGICAL PATHOLOGY

## 2022-03-09 ENCOUNTER — Encounter (HOSPITAL_COMMUNITY): Payer: Self-pay | Admitting: Gastroenterology

## 2022-03-11 ENCOUNTER — Ambulatory Visit (INDEPENDENT_AMBULATORY_CARE_PROVIDER_SITE_OTHER): Payer: 59 | Admitting: Gastroenterology

## 2022-03-16 ENCOUNTER — Other Ambulatory Visit (HOSPITAL_COMMUNITY): Payer: Self-pay

## 2022-03-16 MED ORDER — GABAPENTIN 300 MG PO CAPS: 600.0000 mg | ORAL_CAPSULE | Freq: Three times a day (TID) | ORAL | 3 refills | Status: DC

## 2022-03-16 NOTE — Progress Notes (Signed)
Patient called reporting increase in numbness, tingling and pins and needles feeling states that gabapentin is not helping. Dr. Delton Coombes made aware. Order received for '600mg'$  TID per Dr. Delton Coombes, new prescription sent. ?

## 2022-03-19 ENCOUNTER — Encounter: Payer: Self-pay | Admitting: Internal Medicine

## 2022-03-19 ENCOUNTER — Ambulatory Visit (INDEPENDENT_AMBULATORY_CARE_PROVIDER_SITE_OTHER): Payer: 59 | Admitting: Internal Medicine

## 2022-03-19 DIAGNOSIS — G609 Hereditary and idiopathic neuropathy, unspecified: Secondary | ICD-10-CM | POA: Diagnosis not present

## 2022-03-19 DIAGNOSIS — F419 Anxiety disorder, unspecified: Secondary | ICD-10-CM | POA: Diagnosis not present

## 2022-03-19 NOTE — Progress Notes (Signed)
?  ? ?Virtual Visit via Telephone Note  ? ?This visit type was conducted due to national recommendations for restrictions regarding the COVID-19 Pandemic (e.g. social distancing) in an effort to limit this patient's exposure and mitigate transmission in our community.  Due to her co-morbid illnesses, this patient is at least at moderate risk for complications without adequate follow up.  This format is felt to be most appropriate for this patient at this time.  The patient did not have access to video technology/had technical difficulties with video requiring transitioning to audio format only (telephone).  All issues noted in this document were discussed and addressed.  No physical exam could be performed with this format. ? ?Evaluation Performed:  Follow-up visit ? ?Date:  03/19/2022  ? ?ID:  Alexandra Bright, DOB [birth year], MRN [id number] ? ?Patient Location: Home ?Provider Location: Office/Clinic ? ?Participants: Patient ?Location of Patient: Home ?Location of Provider: Telehealth ?Consent was obtain for visit to be over via telehealth. ?I verified that I am speaking with the correct person using two identifiers. ? ?PCP:  Lindell Spar, MD  ? ?Chief Complaint: Neuropathy pain ? ?History of Present Illness:   ? ?Alexandra Bright is a 56 y.o. female who has a televisit for complaint of neuropathic pain in her UE and LE.  She has been taking gabapentin 300 mg 3 times daily for neuropathy with minimal relief.  Denies any recent injury. ? ?She also complains of spells of anxiety despite taking Wellbutrin currently.  She did not tolerate SSRI in the past.  She denies any anhedonia, SI or HI currently. ? ?The patient does not have symptoms concerning for COVID-19 infection (fever, chills, cough, or new shortness of breath).  ? ?Past Medical, Surgical, Social History, Allergies, and Medications have been Reviewed. ? ?Past Medical History:  ?Diagnosis Date  ? Anxiety   ? ASCUS of cervix with negative high risk HPV  02/01/2022  ? 02/01/22 repeat in 1 year per [facility] 5 year CIN3+risk is 0.78%  ? Colon cancer ([place]) 01/2021  ? Constipation   ? GERD (gastroesophageal reflux disease)   ? Hypertension   ? Port-A-Cath in place 08/17/2021  ? Pre-diabetes   ? ?Past Surgical History:  ?Procedure Laterality Date  ? BIOPSY  02/18/2021  ? Procedure: BIOPSY;  Surgeon: Harvel Quale, MD;  [facility];  Service: Gastroenterology;;  ? BIOPSY  03/05/2022  ? Procedure: BIOPSY;  Surgeon: Montez Morita, Quillian Quince, MD;  [facility]: [facility];  Service: Gastroenterology;;  ? COLONOSCOPY WITH PROPOFOL N/A 03/05/2022  ? Procedure: COLONOSCOPY WITH PROPOFOL;  Surgeon: Harvel Quale, MD;  [facility]: [facility];  Service: Gastroenterology;  Laterality: N/A;  1040 ASA 1  ? FACIAL FRACTURE SURGERY  1991  ? from [facility]  ? FLEXIBLE SIGMOIDOSCOPY  02/18/2021  ? Procedure: FLEXIBLE SIGMOIDOSCOPY;  Surgeon: Montez Morita, Quillian Quince, MD;  [facility]: [facility];  Service: Gastroenterology;;  ? IR IMAGING GUIDED PORT INSERTION  08/13/2021  ? POLYPECTOMY  02/18/2021  ? Procedure: POLYPECTOMY INTESTINAL;  Surgeon: Harvel Quale, MD;  [facility]: [facility];  Service: Gastroenterology;;  ? SUBMUCOSAL TATTOO INJECTION  02/18/2021  ? Procedure: SUBMUCOSAL TATTOO INJECTION;  Surgeon: Harvel Quale, MD;  [facility];  Service: Gastroenterology;;  ? SUBMUCOSAL TATTOO INJECTION  03/05/2022  ? Procedure: SUBMUCOSAL TATTOO INJECTION;  Surgeon: Montez Morita, Quillian Quince, MD;  [facility];  Service: Gastroenterology;;  ? XI ROBOTIC ASSISTED LOWER ANTERIOR RESECTION N/A 07/10/2021  ? Procedure:  XI ROBOTIC ASSISTED LOWER ANTERIOR RESECTION;  Surgeon: Leighton Ruff, MD;  Location: [facility];  Service: General;  Laterality: N/A;  ?  ? ?Current Meds  ?Medication Sig  ? acetaminophen (TYLENOL) 500 MG tablet Take 1,000 mg by mouth every 6 (six) hours as needed for mild pain, moderate pain or headache.  ?  buPROPion (WELLBUTRIN SR) 200 MG 12 hr tablet Take 1 tablet (200 mg total) by mouth 2 (two) times daily.  ? DIPHENHYDRAMINE-APAP, SLEEP, PO Take 50 mg by mouth at bedtime.  ? Docusate Calcium (STOOL SOFTENER PO) Take 1 tablet by mouth daily.  ? gabapentin (NEURONTIN) 300 MG capsule Take 2 capsules (600 mg total) by mouth 3 (three) times daily.  ? lidocaine-prilocaine (EMLA) cream Apply a small amount to port a cath site and cover with plastic wrap 1 hour prior to chemotherapy appointments (Patient taking differently: 1 application. See admin instructions. Apply a small amount to port a cath site and cover with plastic wrap 1 hour prior blood draw)  ? losartan (COZAAR) 50 MG tablet Take 1 tablet by mouth once daily  ? Multiple Vitamins-Minerals (MULTIVITAMIN WITH MINERALS) tablet Take 1 tablet by mouth in the morning. Women's Multivitamin  ? simethicone (MYLICON) 80 MG chewable tablet Chew 80 mg by mouth every 6 (six) hours as needed for flatulence.  ? sucralfate (CARAFATE) 1 g tablet Take 1 tablet (1 g total) by mouth 4 (four) times daily -  with meals and at bedtime.  ? traMADol (ULTRAM) 50 MG tablet Take 1 tablet (50 mg total) by mouth daily as needed. (Patient taking differently: Take 50 mg by mouth daily as needed for moderate pain or severe pain.)  ? vitamin B-12 (CYANOCOBALAMIN) 1000 MCG tablet Take 1,000 mcg by mouth daily.  ?  ? ?Allergies:   Patient has no known allergies.  ? ?ROS:   ?Please see the history of present illness.    ? ?All other systems reviewed and are negative. ? ? ?Labs/Other Tests and Data Reviewed:   ? ?Recent Labs: ?12/07/2021: Magnesium 1.9 ?12/31/2021: ALT 22; BUN 15; Creatinine, Ser 0.94; Hemoglobin 11.3; Platelets 237; Potassium 4.0; Sodium 134  ? ?Recent Lipid Panel ?Lab Results  ?Component Value Date/Time  ? CHOL 198 11/27/2020 11:38 AM  ? TRIG 154 (H) 11/27/2020 11:38 AM  ? HDL 59 11/27/2020 11:38 AM  ? CHOLHDL 3.4 11/27/2020 11:38 AM  ? LDLCALC 112 (H) 11/27/2020 11:38 AM  ? ? ?Wt  Readings from Last 3 Encounters:  ?03/05/22 138 lb (62.6 kg)  ?02/15/22 136 lb (61.7 kg)  ?02/11/22 133 lb 3.2 oz (60.4 kg)  ?  ? ?ASSESSMENT & PLAN:   ? ?Idiopathic peripheral neuropathy ?Pins-and-needles sensation in bilateral UE and LE ?Recently increased dose of gabapentin to 600 mg TID by her Oncologist -  Needs to increase her dose as she is taking 300 mg TID ?If persistent, may switch to Lyrica and/or neurology referral ? ?Anxiety ?Better with Wellbutrin 200 mg twice daily for now ?Did not tolerate SSRI in the past ?Would avoid BZD for now as she is on Gabapentin ? ? ?Time:   ?Today, I have spent 9 minutes reviewing the chart, including problem list, medications, and with the patient with telehealth technology discussing the above problems. ? ? ?Medication Adjustments/Labs and Tests Ordered: ?Current medicines are reviewed at length with the patient today.  Concerns regarding medicines are outlined above.  ? ?Tests Ordered: ?No orders of the defined types were placed in this encounter. ? ? ?Medication  Changes: ?No orders of the defined types were placed in this encounter. ? ? ? ?Note: This dictation was prepared with Dragon dictation along with smaller phrase technology. Similar sounding words can be transcribed inadequately or may not be corrected upon review. Any transcriptional errors that result from this process are unintentional.  ?  ? ? ?Disposition:  Follow up  ?Signed, ?Lindell Spar, MD  ?03/19/2022 12:01 PM    ? ?[facility] ?[facility] ?

## 2022-03-19 NOTE — Assessment & Plan Note (Signed)
Pins-and-needles sensation in bilateral UE and LE ?Recently increased dose of gabapentin to 600 mg TID by her Oncologist -  Needs to increase her dose as she is taking 300 mg TID ?If persistent, may switch to Lyrica and/or neurology referral ?

## 2022-03-19 NOTE — Patient Instructions (Signed)
Please start taking gabapentin 600 mg 3 times daily as prescribed. ?

## 2022-03-19 NOTE — Assessment & Plan Note (Signed)
Better with Wellbutrin 200 mg twice daily for now Did not tolerate SSRI in the past Would avoid BZD for now as she is on Gabapentin 

## 2022-03-22 ENCOUNTER — Other Ambulatory Visit: Payer: Self-pay | Admitting: Internal Medicine

## 2022-03-22 DIAGNOSIS — I10 Essential (primary) hypertension: Secondary | ICD-10-CM

## 2022-04-06 ENCOUNTER — Ambulatory Visit (HOSPITAL_COMMUNITY)
Admission: RE | Admit: 2022-04-06 | Discharge: 2022-04-06 | Disposition: A | Discharge status: Home / Self Care | Payer: 59 | Source: Ambulatory Visit | Attending: Hematology | Admitting: Hematology

## 2022-04-06 ENCOUNTER — Inpatient Hospital Stay (HOSPITAL_COMMUNITY): Payer: 59 | Attending: Hematology

## 2022-04-06 VITALS — BP 146/92 | HR 77 | Temp 96.3°F | Resp 18

## 2022-04-06 DIAGNOSIS — Z8041 Family history of malignant neoplasm of ovary: Secondary | ICD-10-CM | POA: Insufficient documentation

## 2022-04-06 DIAGNOSIS — G629 Polyneuropathy, unspecified: Secondary | ICD-10-CM | POA: Insufficient documentation

## 2022-04-06 DIAGNOSIS — Z9221 Personal history of antineoplastic chemotherapy: Secondary | ICD-10-CM | POA: Diagnosis not present

## 2022-04-06 DIAGNOSIS — C20 Malignant neoplasm of rectum: Secondary | ICD-10-CM | POA: Insufficient documentation

## 2022-04-06 DIAGNOSIS — Z95828 Presence of other vascular implants and grafts: Secondary | ICD-10-CM

## 2022-04-06 DIAGNOSIS — Z87891 Personal history of nicotine dependence: Secondary | ICD-10-CM | POA: Diagnosis not present

## 2022-04-06 DIAGNOSIS — C19 Malignant neoplasm of rectosigmoid junction: Secondary | ICD-10-CM | POA: Insufficient documentation

## 2022-04-06 DIAGNOSIS — Z79899 Other long term (current) drug therapy: Secondary | ICD-10-CM | POA: Diagnosis not present

## 2022-04-06 DIAGNOSIS — Z8042 Family history of malignant neoplasm of prostate: Secondary | ICD-10-CM | POA: Insufficient documentation

## 2022-04-06 DIAGNOSIS — Z452 Encounter for adjustment and management of vascular access device: Secondary | ICD-10-CM | POA: Diagnosis present

## 2022-04-06 LAB — HEPATITIS PANEL, ACUTE
HCV Ab: NONREACTIVE
Hep A IgM: NONREACTIVE
Hep B C IgM: NONREACTIVE
Hepatitis B Surface Ag: NONREACTIVE

## 2022-04-06 LAB — TSH: TSH: 1.465 u[IU]/mL (ref 0.350–4.500)

## 2022-04-06 LAB — CBC WITH DIFFERENTIAL/PLATELET
Abs Immature Granulocytes: 0.02 10*3/uL (ref 0.00–0.07)
Basophils Absolute: 0 10*3/uL (ref 0.0–0.1)
Basophils Relative: 1 %
Eosinophils Absolute: 0.3 10*3/uL (ref 0.0–0.5)
Eosinophils Relative: 5 %
HCT: 35.1 % — ABNORMAL LOW (ref 36.0–46.0)
Hemoglobin: 11.6 g/dL — ABNORMAL LOW (ref 12.0–15.0)
Immature Granulocytes: 0 %
Lymphocytes Relative: 27 %
Lymphs Abs: 1.6 10*3/uL (ref 0.7–4.0)
MCH: 32.8 pg (ref 26.0–34.0)
MCHC: 33 g/dL (ref 30.0–36.0)
MCV: 99.2 fL (ref 80.0–100.0)
Monocytes Absolute: 0.7 10*3/uL (ref 0.1–1.0)
Monocytes Relative: 11 %
Neutro Abs: 3.3 10*3/uL (ref 1.7–7.7)
Neutrophils Relative %: 56 %
Platelets: 235 10*3/uL (ref 150–400)
RBC: 3.54 MIL/uL — ABNORMAL LOW (ref 3.87–5.11)
RDW: 12.9 % (ref 11.5–15.5)
WBC: 5.9 10*3/uL (ref 4.0–10.5)
nRBC: 0 % (ref 0.0–0.2)

## 2022-04-06 LAB — COMPREHENSIVE METABOLIC PANEL
ALT: 24 U/L (ref 0–44)
AST: 22 U/L (ref 15–41)
Albumin: 3.8 g/dL (ref 3.5–5.0)
Alkaline Phosphatase: 83 U/L (ref 38–126)
Anion gap: 5 (ref 5–15)
BUN: 14 mg/dL (ref 6–20)
CO2: 25 mmol/L (ref 22–32)
Calcium: 8.9 mg/dL (ref 8.9–10.3)
Chloride: 108 mmol/L (ref 98–111)
Creatinine, Ser: 0.78 mg/dL (ref 0.44–1.00)
GFR, Estimated: >60 mL/min (ref >60)
Glucose, Bld: 84 mg/dL (ref 70–99)
Potassium: 4.3 mmol/L (ref 3.5–5.1)
Sodium: 138 mmol/L (ref 135–145)
Total Bilirubin: 0.5 mg/dL (ref 0.3–1.2)
Total Protein: 7.2 g/dL (ref 6.5–8.1)

## 2022-04-06 LAB — VITAMIN B12: Vitamin B-12: 1041 pg/mL — ABNORMAL HIGH (ref 180–914)

## 2022-04-06 LAB — T4, FREE: Free T4: 0.83 ng/dL (ref 0.61–1.12)

## 2022-04-06 MED ORDER — HEPARIN SOD (PORK) LOCK FLUSH 100 UNIT/ML IV SOLN
INTRAVENOUS | Status: AC
  Administered 2022-04-06: 500 [IU]
  Filled 2022-04-06: qty 5

## 2022-04-06 MED ORDER — IOHEXOL 300 MG/ML  SOLN
100.0000 mL | Freq: Once | INTRAMUSCULAR | Status: AC | PRN
  Administered 2022-04-06: 100 mL via INTRAVENOUS

## 2022-04-06 NOTE — Patient Instructions (Signed)
[facility]  Discharge Instructions: °Thank you for choosing [facility] to provide your oncology and hematology care.  °If you have a lab appointment with the Cancer Center, please come in thru the Main Entrance and check in at the main information desk. ° °Wear comfortable clothing and clothing appropriate for easy access to any Portacath or PICC line.  ° °We strive to give you quality time with your provider. You may need to reschedule your appointment if you arrive late (15 or more minutes).  Arriving late affects you and other patients whose appointments are after yours.  Also, if you miss three or more appointments without notifying the office, you may be dismissed from the clinic at the provider’s discretion.    °  °For prescription refill requests, have your pharmacy contact our office and allow 72 hours for refills to be completed.   ° °Today you received the following chemotherapy and/or immunotherapy agents PORT flush    °  °To help prevent nausea and vomiting after your treatment, we encourage you to take your nausea medication as directed. ° °BELOW ARE SYMPTOMS THAT SHOULD BE REPORTED IMMEDIATELY: °*FEVER GREATER THAN 100.4 F (38 °C) OR HIGHER °*CHILLS OR SWEATING °*NAUSEA AND VOMITING THAT IS NOT CONTROLLED WITH YOUR NAUSEA MEDICATION °*UNUSUAL SHORTNESS OF BREATH °*UNUSUAL BRUISING OR BLEEDING °*URINARY PROBLEMS (pain or burning when urinating, or frequent urination) °*BOWEL PROBLEMS (unusual diarrhea, constipation, pain near the anus) °TENDERNESS IN MOUTH AND THROAT WITH OR WITHOUT PRESENCE OF ULCERS (sore throat, sores in mouth, or a toothache) °UNUSUAL RASH, SWELLING OR PAIN  °UNUSUAL VAGINAL DISCHARGE OR ITCHING  ° °Items with * indicate a potential emergency and should be followed up as soon as possible or go to the Emergency Department if any problems should occur. ° °Please show the CHEMOTHERAPY ALERT CARD or IMMUNOTHERAPY ALERT CARD at check-in to the Emergency  Department and triage nurse. ° °Should you have questions after your visit or need to cancel or reschedule your appointment, please contact [facility] [phone]  and follow the prompts.  Office hours are 8:00 a.m. to 4:30 p.m. Monday - Friday. Please note that voicemails left after 4:00 p.m. may not be returned until the following business day.  We are closed weekends and major holidays. You have access to a nurse at all times for urgent questions. Please call the main number to the clinic [phone] and follow the prompts. ° °For any non-urgent questions, you may also contact your provider using MyChart. We now offer e-Visits for anyone 18 and older to request care online for non-urgent symptoms. For details visit [email or web address]. °  °Also download the MyChart app! Go to the app store, search "MyChart", open the app, select [facility], and log in with your MyChart username and password. ° °Due to Covid, a mask is required upon entering the hospital/clinic. If you do not have a mask, one will be given to you upon arrival. For doctor visits, patients may have 1 support person aged 18 or older with them. For treatment visits, patients cannot have anyone with them due to current Covid guidelines and our immunocompromised population.  °

## 2022-04-06 NOTE — Progress Notes (Signed)
Patients port flushed without difficulty.  Good blood return noted with no bruising or swelling noted at site.  Stable during access and blood draw.  Patient to remain accessed for CT. 

## 2022-04-10 ENCOUNTER — Other Ambulatory Visit (HOSPITAL_COMMUNITY): Payer: Self-pay | Admitting: Hematology

## 2022-04-12 ENCOUNTER — Encounter (HOSPITAL_COMMUNITY): Payer: Self-pay | Admitting: Hematology

## 2022-04-13 ENCOUNTER — Inpatient Hospital Stay (HOSPITAL_COMMUNITY): Payer: 59

## 2022-04-13 ENCOUNTER — Inpatient Hospital Stay (HOSPITAL_COMMUNITY): Payer: 59 | Admitting: Hematology

## 2022-04-13 ENCOUNTER — Other Ambulatory Visit (HOSPITAL_COMMUNITY): Payer: Self-pay | Admitting: Hematology

## 2022-04-13 ENCOUNTER — Encounter (HOSPITAL_COMMUNITY): Payer: Self-pay | Admitting: Hematology

## 2022-04-13 VITALS — BP 138/87 | HR 84 | Temp 98.1°F | Resp 16 | Wt 137.0 lb

## 2022-04-13 DIAGNOSIS — Z452 Encounter for adjustment and management of vascular access device: Secondary | ICD-10-CM | POA: Diagnosis not present

## 2022-04-13 DIAGNOSIS — C20 Malignant neoplasm of rectum: Secondary | ICD-10-CM

## 2022-04-13 LAB — CBC WITH DIFFERENTIAL/PLATELET
Abs Immature Granulocytes: 0.02 10*3/uL (ref 0.00–0.07)
Basophils Absolute: 0.1 10*3/uL (ref 0.0–0.1)
Basophils Relative: 1 %
Eosinophils Absolute: 0.1 10*3/uL (ref 0.0–0.5)
Eosinophils Relative: 1 %
HCT: 38 % (ref 36.0–46.0)
Hemoglobin: 12.4 g/dL (ref 12.0–15.0)
Immature Granulocytes: 0 %
Lymphocytes Relative: 20 %
Lymphs Abs: 2 10*3/uL (ref 0.7–4.0)
MCH: 31.7 pg (ref 26.0–34.0)
MCHC: 32.6 g/dL (ref 30.0–36.0)
MCV: 97.2 fL (ref 80.0–100.0)
Monocytes Absolute: 0.5 10*3/uL (ref 0.1–1.0)
Monocytes Relative: 5 %
Neutro Abs: 7.2 10*3/uL (ref 1.7–7.7)
Neutrophils Relative %: 73 %
Platelets: 271 10*3/uL (ref 150–400)
RBC: 3.91 MIL/uL (ref 3.87–5.11)
RDW: 13.2 % (ref 11.5–15.5)
WBC: 9.9 10*3/uL (ref 4.0–10.5)
nRBC: 0 % (ref 0.0–0.2)

## 2022-04-13 LAB — COMPREHENSIVE METABOLIC PANEL
ALT: 22 U/L (ref 0–44)
AST: 23 U/L (ref 15–41)
Albumin: 4 g/dL (ref 3.5–5.0)
Alkaline Phosphatase: 91 U/L (ref 38–126)
Anion gap: 7 (ref 5–15)
BUN: 14 mg/dL (ref 6–20)
CO2: 25 mmol/L (ref 22–32)
Calcium: 9.6 mg/dL (ref 8.9–10.3)
Chloride: 109 mmol/L (ref 98–111)
Creatinine, Ser: 1.02 mg/dL — ABNORMAL HIGH (ref 0.44–1.00)
GFR, Estimated: >60 mL/min (ref >60)
Glucose, Bld: 133 mg/dL — ABNORMAL HIGH (ref 70–99)
Potassium: 3.7 mmol/L (ref 3.5–5.1)
Sodium: 141 mmol/L (ref 135–145)
Total Bilirubin: 0.3 mg/dL (ref 0.3–1.2)
Total Protein: 7.5 g/dL (ref 6.5–8.1)

## 2022-04-13 MED ORDER — GABAPENTIN 300 MG PO CAPS: 900.0000 mg | ORAL_CAPSULE | Freq: Three times a day (TID) | ORAL | 6 refills | Status: DC

## 2022-04-13 MED ORDER — GABAPENTIN 300 MG PO CAPS: 600.0000 mg | ORAL_CAPSULE | Freq: Three times a day (TID) | ORAL | 6 refills | Status: DC

## 2022-04-13 NOTE — Progress Notes (Signed)
[place], Alexandra Bright [id number]   CLINIC:  Medical Oncology/Hematology  PCP:  Lindell Spar, MD [address] / [place] [address] [phone]   REASON FOR VISIT:  Follow-up for sigmoid adenocarcinoma  PRIOR THERAPY: none  NGS Results: not done  CURRENT THERAPY: FOLFOX every 2 weeks x 4 months  BRIEF ONCOLOGIC HISTORY:  Oncology History  Rectal cancer ([facility])  04/08/2021 Initial Diagnosis   Rectal cancer ([facility])    04/20/2021 - 04/20/2021 Chemotherapy          08/18/2021 -  Chemotherapy   Patient is on Treatment Plan : COLORECTAL FOLFOX q14d x 4 months        CANCER STAGING:  Cancer Staging  Rectal cancer ([facility]) Staging form: Colon and Rectum, AJCC 8th Edition - Clinical stage from 04/08/2021: Stage IIIB (cT3, cN1b, cM0) - Unsigned   INTERVAL HISTORY:  [name], a 56 y.o. female, returns for routine follow-up of her sigmoid adenocarcinoma. Acacia was last seen on 01/05/2022.   Today she reports feeling good. She reports neuropathy in her fingers and toes for which she is taking 2 tablets of tylenol TID, tramadol TID, and 600 mg gabapentin TID; she reports mild numbness and severe pins-and-needles. She denies drowsiness with Gabapentin and reports that it helps slightly. She reports gripping objects such a steering wheel has become more difficult due to this neuropathy. She also reports this neuropathy occasional disrupts her sleep at night. She denies issue with BM. She denies new abdominal pain.   REVIEW OF SYSTEMS:  Review of Systems  Constitutional:  Negative for appetite change and fatigue.  Gastrointestinal:  Negative for abdominal pain, constipation and diarrhea.  Neurological:  Positive for numbness (fingers and toes).  Psychiatric/Behavioral:  Positive for sleep disturbance.   All other systems reviewed and are negative.  PAST MEDICAL/SURGICAL HISTORY:  Past Medical History:  Diagnosis Date   Anxiety     ASCUS of cervix with negative high risk HPV 02/01/2022   02/01/22 repeat in 1 year per [facility] 5 year CIN3+risk is 0.78%   Colon cancer ([facility]) 01/2021   Constipation    GERD (gastroesophageal reflux disease)    Hypertension    Port-A-Cath in place 08/17/2021   Pre-diabetes    Past Surgical History:  Procedure Laterality Date   BIOPSY  02/18/2021   Procedure: BIOPSY;  Surgeon: Harvel Quale, MD;  [facility];  Service: Gastroenterology;;   BIOPSY  03/05/2022   Procedure: BIOPSY;  Surgeon: Harvel Quale, MD;  [facility];  Service: Gastroenterology;;   COLONOSCOPY WITH PROPOFOL N/A 03/05/2022   Procedure: COLONOSCOPY WITH PROPOFOL;  Surgeon: Harvel Quale, MD;  [facility];  Service: Gastroenterology;  Laterality: N/A;  1040 ASA 1   FACIAL FRACTURE SURGERY  1991   from [place]  02/18/2021   Procedure: FLEXIBLE SIGMOIDOSCOPY;  Surgeon: Harvel Quale, MD;  [facility];  Service: Gastroenterology;;   IR IMAGING GUIDED PORT INSERTION  08/13/2021   POLYPECTOMY  02/18/2021   Procedure: POLYPECTOMY INTESTINAL;  Surgeon: Harvel Quale, MD;  [facility];  Service: Gastroenterology;;   SUBMUCOSAL TATTOO INJECTION  02/18/2021   Procedure: SUBMUCOSAL TATTOO INJECTION;  Surgeon: Harvel Quale, MD;  [facility];  Service: Gastroenterology;;   SUBMUCOSAL TATTOO INJECTION  03/05/2022   Procedure: SUBMUCOSAL TATTOO INJECTION;  Surgeon: Harvel Quale, MD;  [facility];  Service: Gastroenterology;;   XI ROBOTIC ASSISTED LOWER ANTERIOR RESECTION N/A 07/10/2021   Procedure: XI ROBOTIC ASSISTED LOWER ANTERIOR RESECTION;  Surgeon: Leighton Ruff, MD;  Location: [facility];  Service: General;  Laterality: N/A;    SOCIAL HISTORY:  Social History   Socioeconomic History   Marital status: Married    Spouse name: Not on file   Number of  children: Not on file   Years of education: Not on file   Highest education level: Not on file  Occupational History   Not on file  Tobacco Use   Smoking status: Former    Packs/day: 0.50    Years: 40.00    Pack years: 20.00    Types: Cigarettes    Quit date: 2020    Years since quitting: 3.3   Smokeless tobacco: Never  Vaping Use   Vaping Use: Never used  Substance and Sexual Activity   Alcohol use: Not Currently   Drug use: Never   Sexual activity: Yes    Birth control/protection: Post-menopausal  Other Topics Concern   Not on file  Social History Narrative   Not on file   Social Determinants of Health   Financial Resource Strain: Low Risk    Difficulty of Paying Living Expenses: Not hard at all  Food Insecurity: No Food Insecurity   Worried About Charity fundraiser in the Last Year: Never true   Ran Out of Food in the Last Year: Never true  Transportation Needs: No Transportation Needs   Lack of Transportation (Medical): No   Lack of Transportation (Non-Medical): No  Physical Activity: Sufficiently Active   Days of Exercise per Week: 4 days   Minutes of Exercise per Session: 60 min  Stress: No Stress Concern Present   Feeling of Stress : Only a little  Social Connections: Moderately Integrated   Frequency of Communication with Friends and Family: More than three times a week   Frequency of Social Gatherings with Friends and Family: Three times a week   Attends Religious Services: More than 4 times per year   Active Member of Clubs or Organizations: No   Attends Archivist Meetings: Never   Marital Status: Married  Human resources officer Violence: Not At Risk   Fear of Current or Ex-Partner: No   Emotionally Abused: No   Physically Abused: No   Sexually Abused: No    FAMILY HISTORY:  Family History  Problem Relation Age of Onset   Diabetes Paternal Grandfather    Diabetes Paternal Grandmother    Cancer Maternal Grandmother    Emphysema Maternal  Grandfather    Diabetes Father    Diabetes Sister    Cancer Maternal Aunt    Ovarian cancer Paternal Aunt    Ovarian cancer Cousin    Prostate cancer Cousin     CURRENT MEDICATIONS:  Current Outpatient Medications  Medication Sig Dispense Refill   acetaminophen (TYLENOL) 500 MG tablet Take 1,000 mg by mouth every 6 (six) hours as needed for mild pain, moderate pain or headache.     buPROPion (WELLBUTRIN SR) 200 MG 12 hr tablet Take 1 tablet (200 mg total) by mouth 2 (two) times daily. 60 tablet 5   DIPHENHYDRAMINE-APAP, SLEEP, PO Take 50 mg by mouth at bedtime.     Docusate Calcium (STOOL SOFTENER PO) Take 1 tablet by mouth daily.     lidocaine-prilocaine (EMLA) cream Apply a small amount to port a cath site and cover with plastic wrap 1 hour prior  to chemotherapy appointments (Patient taking differently: 1 application. See admin instructions. Apply a small amount to port a cath site and cover with plastic wrap 1 hour prior blood draw) 30 g 3   losartan (COZAAR) 50 MG tablet Take 1 tablet by mouth once daily 90 tablet 0   Multiple Vitamins-Minerals (MULTIVITAMIN WITH MINERALS) tablet Take 1 tablet by mouth in the morning. Women's Multivitamin     simethicone (MYLICON) 80 MG chewable tablet Chew 80 mg by mouth every 6 (six) hours as needed for flatulence.     sucralfate (CARAFATE) 1 g tablet Take 1 tablet (1 g total) by mouth 4 (four) times daily -  with meals and at bedtime. 21 tablet 0   traMADol (ULTRAM) 50 MG tablet Take 1 tablet (50 mg total) by mouth daily as needed. (Patient taking differently: Take 50 mg by mouth daily as needed for moderate pain or severe pain.) 60 tablet 0   vitamin B-12 (CYANOCOBALAMIN) 1000 MCG tablet Take 1,000 mcg by mouth daily.     gabapentin (NEURONTIN) 300 MG capsule Take 2 capsules (600 mg total) by mouth 3 (three) times daily. 180 capsule 6   No current facility-administered medications for this visit.   Facility-Administered Medications Ordered in Other  Visits  Medication Dose Route Frequency Provider Last Rate Last Admin   sodium chloride flush (NS) 0.9 % injection 10 mL  10 mL Intracatheter PRN Derek Jack, MD   10 mL at 09/03/21 1337    ALLERGIES:  No Known Allergies  PHYSICAL EXAM:  Performance status (ECOG): 1 - Symptomatic but completely ambulatory  Vitals:   04/13/22 1454  BP: 138/87  Pulse: 84  Resp: 16  Temp: 98.1 F (36.7 C)  SpO2: 97%   Wt Readings from Last 3 Encounters:  04/13/22 137 lb (62.1 kg)  03/05/22 138 lb (62.6 kg)  02/15/22 136 lb (61.7 kg)   Physical Exam Vitals reviewed.  Constitutional:      Appearance: Normal appearance.  Cardiovascular:     Rate and Rhythm: Normal rate and regular rhythm.     Pulses: Normal pulses.     Heart sounds: Normal heart sounds.  Pulmonary:     Effort: Pulmonary effort is normal.     Breath sounds: Normal breath sounds.  Abdominal:     Palpations: Abdomen is soft. There is no hepatomegaly, splenomegaly or mass.     Tenderness: There is no abdominal tenderness.  Lymphadenopathy:     Lower Body: No right inguinal adenopathy. No left inguinal adenopathy.  Neurological:     General: No focal deficit present.     Mental Status: She is alert and oriented to person, place, and time.  Psychiatric:        Mood and Affect: Mood normal.        Behavior: Behavior normal.     LABORATORY DATA:  I have reviewed the labs as listed.     Latest Ref Rng & Units 04/13/2022   12:45 PM 04/06/2022    8:00 AM 12/31/2021    3:15 PM  CBC  WBC 4.0 - 10.5 K/uL 9.9   5.9   9.6    Hemoglobin 12.0 - 15.0 g/dL 12.4   11.6   11.3    Hematocrit 36.0 - 46.0 % 38.0   35.1   33.6    Platelets 150 - 400 K/uL 271   235   237        Latest Ref Rng & Units 04/13/2022   12:45 PM  04/06/2022    8:00 AM 12/31/2021    3:15 PM  CMP  Glucose 70 - 99 mg/dL 133   84   95    BUN 6 - 20 mg/dL 14   14   15     Creatinine 0.44 - 1.00 mg/dL 1.02   0.78   0.94    Sodium 135 - 145 mmol/L 141   138    134    Potassium 3.5 - 5.1 mmol/L 3.7   4.3   4.0    Chloride 98 - 111 mmol/L 109   108   101    CO2 22 - 32 mmol/L 25   25   23     Calcium 8.9 - 10.3 mg/dL 9.6   8.9   9.5    Total Protein 6.5 - 8.1 g/dL 7.5   7.2   7.2    Total Bilirubin 0.3 - 1.2 mg/dL 0.3   0.5   0.7    Alkaline Phos 38 - 126 U/L 91   83   94    AST 15 - 41 U/L 23   22   24     ALT 0 - 44 U/L 22   24   22       DIAGNOSTIC IMAGING:  I have independently reviewed the scans and discussed with the patient. CT Abdomen Pelvis W Contrast  Result Date: 04/07/2022 CLINICAL DATA:  Follow-up rectal carcinoma.  Ongoing chemotherapy. * Tracking Code: BO * EXAM: CT ABDOMEN AND PELVIS WITH CONTRAST TECHNIQUE: Multidetector CT imaging of the abdomen and pelvis was performed using the standard protocol following bolus administration of intravenous contrast. RADIATION DOSE REDUCTION: This exam was performed according to the departmental dose-optimization program which includes automated exposure control, adjustment of the mA and/or kV according to patient size and/or use of iterative reconstruction technique. CONTRAST:  185m OMNIPAQUE IOHEXOL 300 MG/ML  SOLN COMPARISON:  09/25/2021 FINDINGS: Lower Chest: No acute findings. Hepatobiliary: No hepatic masses identified. Gallbladder is unremarkable. No evidence of biliary ductal dilatation. Pancreas:  No mass or inflammatory changes. Spleen: Within normal limits in size and appearance. Adrenals/Urinary Tract: No masses identified. No evidence of ureteral calculi or hydronephrosis. Stomach/Bowel: Surgical anastomosis noted in the upper rectum. No mass identified. No evidence of obstruction, inflammatory process or abnormal fluid collections. Normal appendix visualized. Vascular/Lymphatic: No pathologically enlarged lymph nodes. No acute vascular findings. Reproductive:  No mass or other significant abnormality. Other:  None. Musculoskeletal:  No suspicious bone lesions identified. IMPRESSION: No  evidence of recurrent or metastatic carcinoma within the abdomen or pelvis. Electronically Signed   By: JMarlaine HindM.D.   On: 04/07/2022 12:35     ASSESSMENT:  1.  Rectosigmoid sigmoid adenocarcinoma: - Presentation with the bleeding per rectum for the last 5 to 6 months and constipation. - Colonoscopy on 02/18/2021 showed ulcerated partially obstructing mass found from 12-17 cm from the anal verge, circumferential measuring 5 cm in length. - Biopsy consistent with adenocarcinoma. - CT CAP on 03/06/2021 with circumferential wall thickening, hyperenhancement and fat stranding about the rectosigmoid junction, mass measuring approximately 6 cm in length.  No evidence of lymphadenopathy or metastatic disease. - CEA on 02/19/2021 was 1.8. - MRI of the pelvis on 04/06/2021 shows high rectal T3c/T4AN1 malignancy. - Chemoradiation therapy with Xeloda from 04/22/2021 through 06/01/2021. - CT CAP on 06/29/2021 showed improvement in the rectosigmoid thickening at the site of malignancy.  No clear adenopathy.  No evidence of metastatic disease. - Robotic assisted LAR on 07/10/2021  with pathology showing 2 cm grade 2, margins negative, 1/18 lymph nodes positive, no tumor deposits, positive treatment effect, no perineural invasion, no lymphovascular invasion, [id number], MMR preserved - She will need completion colonoscopy after adjuvant therapy. - 8 cycles of FOLFOX completed on 12/07/2021.   2.  Social/family history: - She worked as a Secretary/administrator and recently stopped working.  She quit smoking in October 2021, smoked 1 pack/day for 40 years. - Maternal aunt had lung cancer and was a non-smoker.  Paternal aunt had cancer, type not known to the patient.   PLAN:  1.  T3c/T4N1 rectosigmoid  adenocarcinoma: - She does not report any change in bowel habits.  No bleeding per rectum. - Reviewed labs from 04/13/2022 which showed normal LFTs and CBC.  CEA is pending.  CTAP from 04/06/2022 with no evidence of recurrence. -  RTC 3 months for follow-up with repeat CBC, CMP and CEA.   2.  Peripheral neuropathy: - She has developed a neuropathy with pins and needle sensation in the fingertips and toes 1 month after finishing chemotherapy. - She is currently taking gabapentin 600 mg 3 times a day.  She is also requiring tramadol 50 mg 3 times daily and Tylenol 2 pills 3 times daily. - Recommend increasing gabapentin to 900 mg 3 times daily.  We will slowly taper off tramadol.   Orders placed this encounter:  Orders Placed This Encounter  Procedures   CBC with Differential   Comprehensive metabolic panel   CEA     Derek Jack, MD [place] [phone]   I, Thana Ates, am acting as a scribe for Dr. Derek Jack.  I, Derek Jack MD, have reviewed the above documentation for accuracy and completeness, and I agree with the above.

## 2022-04-13 NOTE — Patient Instructions (Signed)
[place] at [facility] Discharge Instructions  You were seen and examined today by Dr. Delton Coombes.  Dr. Delton Coombes discussed your most recent lab work and CT scan which revealed that everything looks good.  Dr. Delton Coombes increased your Gabapentin to three times daily.   Follow-up as scheduled in 3 months.    Thank you for choosing [place] at [facility] to provide your oncology and hematology care.  To afford each patient quality time with our provider, please arrive at least 15 minutes before your scheduled appointment time.   If you have a lab appointment with the [facility] please come in thru the Main Entrance and check in at the main information desk.  You need to re-schedule your appointment should you arrive 10 or more minutes late.  We strive to give you quality time with our providers, and arriving late affects you and other patients whose appointments are after yours.  Also, if you no show three or more times for appointments you may be dismissed from the clinic at the providers discretion.     Again, thank you for choosing [facility].  Our hope is that these requests will decrease the amount of time that you wait before being seen by our physicians.       _____________________________________________________________  Should you have questions after your visit to [facility], please contact our office at [phone] and follow the prompts.  Our office hours are 8:00 a.m. and 4:30 p.m. Monday - Friday.  Please note that voicemails left after 4:00 p.m. may not be returned until the following business day.  We are closed weekends and major holidays.  You do have access to a nurse 24-7, just call the main number to the clinic [phone] and do not press any options, hold on the line and a nurse will answer the phone.    For prescription refill requests, have your pharmacy contact our office and allow  72 hours.    Due to Covid, you will need to wear a mask upon entering the hospital. If you do not have a mask, a mask will be given to you at the Main Entrance upon arrival. For doctor visits, patients may have 1 support person age 90 or older with them. For treatment visits, patients can not have anyone with them due to social distancing guidelines and our immunocompromised population.

## 2022-04-14 LAB — CEA: CEA: 3.9 ng/mL (ref 0.0–4.7)

## 2022-04-15 ENCOUNTER — Telehealth: Payer: Self-pay

## 2022-04-15 ENCOUNTER — Other Ambulatory Visit: Payer: Self-pay

## 2022-04-15 ENCOUNTER — Emergency Department (HOSPITAL_COMMUNITY)
Admission: EM | Admit: 2022-04-15 | Discharge: 2022-04-15 | Disposition: A | Discharge status: Home / Self Care | Payer: 59 | Attending: Emergency Medicine | Admitting: Emergency Medicine

## 2022-04-15 ENCOUNTER — Encounter (HOSPITAL_COMMUNITY): Payer: Self-pay | Admitting: Emergency Medicine

## 2022-04-15 DIAGNOSIS — N6001 Solitary cyst of right breast: Secondary | ICD-10-CM | POA: Insufficient documentation

## 2022-04-15 DIAGNOSIS — Z79899 Other long term (current) drug therapy: Secondary | ICD-10-CM | POA: Insufficient documentation

## 2022-04-15 DIAGNOSIS — I10 Essential (primary) hypertension: Secondary | ICD-10-CM | POA: Diagnosis not present

## 2022-04-15 DIAGNOSIS — N611 Abscess of the breast and nipple: Secondary | ICD-10-CM | POA: Diagnosis present

## 2022-04-15 DIAGNOSIS — Z85038 Personal history of other malignant neoplasm of large intestine: Secondary | ICD-10-CM | POA: Diagnosis not present

## 2022-04-15 MED ORDER — AMOXICILLIN-POT CLAVULANATE 875-125 MG PO TABS: 1.0000 | ORAL_TABLET | Freq: Two times a day (BID) | ORAL | 0 refills | Status: AC

## 2022-04-15 MED ORDER — HYDROCODONE-ACETAMINOPHEN 5-325 MG PO TABS: 1.0000 | ORAL_TABLET | ORAL | 0 refills | Status: DC | PRN

## 2022-04-15 MED ORDER — HYDROCODONE-ACETAMINOPHEN 5-325 MG PO TABS
1.0000 | ORAL_TABLET | Freq: Once | ORAL | Status: AC
  Administered 2022-04-15: 1 via ORAL
  Filled 2022-04-15: qty 1

## 2022-04-15 MED ORDER — AMOXICILLIN-POT CLAVULANATE 875-125 MG PO TABS
1.0000 | ORAL_TABLET | Freq: Once | ORAL | Status: AC
  Administered 2022-04-15: 1 via ORAL
  Filled 2022-04-15: qty 1

## 2022-04-15 NOTE — Discharge Instructions (Addendum)
You were seen today for a possible infected cyst under the breast.  This area did not feel fluctuant so we did not attempt incision and drainage at this time.  I have prescribed antibiotics along with pain medication for you.  I recommend warm compresses throughout the day along with antibiotics as prescribed.  Follow-up with your GYN to discuss further evaluation and management

## 2022-04-15 NOTE — Telephone Encounter (Signed)
Spoke with patient she stated that she had a cyst on her mammogram that has increased to golf ball size over the past few days. It is very tender and hot to the touch. She is requesting that antibiotics be sent in. I discussed patient with Dr. Nelda Marseille she stated that patient needs to be seen to evaluate the lump. She offered to work patient in. I called patient back and she stated that she can't come to the office today, I advised going to urgent care for evaluation. Patient agreeable. No other questions at this time.

## 2022-04-15 NOTE — ED Provider Notes (Signed)
For [facility] Provider Note   CSN: [id number] Arrival date & time: 04/15/22  1538     History  Chief Complaint  Patient presents with   Abscess    Alexandra Bright is a 56 y.o. female.  The patient presents to the hospital from urgent care complaining of a sore area under her right breast.  The patient states that she has had this area of time but in the past few days has become sore.  Ultrasounds of both breasts were completed earlier this year which showed a small cyst in the right breast in the area of concern.  GYN was unable to see her today so she came to the emergency department after being seen at urgent care.  She denies fever, systemic symptoms.  Endorses right breast pain.  Past medical history significant for anxiety, hypertension, prediabetes, history of colon cancer, GERD, ASCUS of cervix  HPI     Home Medications Prior to Admission medications   Medication Sig Start Date End Date Taking? Authorizing Provider  amoxicillin-clavulanate (AUGMENTIN) 875-125 MG tablet Take 1 tablet by mouth every 12 (twelve) hours for 10 days. 04/15/22 04/25/22 Yes Dorothyann Peng, PA-C  HYDROcodone-acetaminophen (NORCO/VICODIN) 5-325 MG tablet Take 1 tablet by mouth every 4 (four) hours as needed. 04/15/22  Yes Dorothyann Peng, PA-C  acetaminophen (TYLENOL) 500 MG tablet Take 1,000 mg by mouth every 6 (six) hours as needed for mild pain, moderate pain or headache.    [provider]  buPROPion (WELLBUTRIN SR) 200 MG 12 hr tablet Take 1 tablet (200 mg total) by mouth 2 (two) times daily. 02/15/22   Lindell Spar, MD  DIPHENHYDRAMINE-APAP, SLEEP, PO Take 50 mg by mouth at bedtime.    [provider]  Docusate Calcium (STOOL SOFTENER PO) Take 1 tablet by mouth daily.    [provider]  gabapentin (NEURONTIN) 300 MG capsule Take 3 capsules (900 mg total) by mouth 3 (three) times daily. 04/13/22   Derek Jack, MD  lidocaine-prilocaine (EMLA)  cream Apply a small amount to port a cath site and cover with plastic wrap 1 hour prior to chemotherapy appointments Patient taking differently: 1 application. See admin instructions. Apply a small amount to port a cath site and cover with plastic wrap 1 hour prior blood draw 08/17/21   Derek Jack, MD  losartan (COZAAR) 50 MG tablet Take 1 tablet by mouth once daily 03/22/22   Lindell Spar, MD  Multiple Vitamins-Minerals (MULTIVITAMIN WITH MINERALS) tablet Take 1 tablet by mouth in the morning. Women's Multivitamin    [provider]  simethicone (MYLICON) 80 MG chewable tablet Chew 80 mg by mouth every 6 (six) hours as needed for flatulence.    [provider]  sucralfate (CARAFATE) 1 g tablet Take 1 tablet (1 g total) by mouth 4 (four) times daily -  with meals and at bedtime. 09/25/21   Carmin Muskrat, MD  traMADol (ULTRAM) 50 MG tablet Take 1 tablet (50 mg total) by mouth 3 (three) times daily as needed. 04/13/22   Derek Jack, MD  vitamin B-12 (CYANOCOBALAMIN) 1000 MCG tablet Take 1,000 mcg by mouth daily.    [provider]      Allergies    Patient has no known allergies.    Review of Systems   Review of Systems  Constitutional:  Negative for fever.  Respiratory:  Negative for shortness of breath.   Gastrointestinal:  Negative for nausea and vomiting.  Skin:  Swollen area under right breast   Physical Exam Updated Vital Signs BP (!) 143/88 (BP Location: Right Arm)   Pulse 97   Temp 98.1 F (36.7 C) (Oral)   Resp 14   Ht '5\' 1"'$  (1.549 m)   Wt 62.1 kg   SpO2 100%   BMI 25.89 kg/m  Physical Exam Vitals and nursing note reviewed.  Constitutional:      General: She is not in acute distress. HENT:     Head: Normocephalic.  Eyes:     Conjunctiva/sclera: Conjunctivae normal.  Cardiovascular:     Rate and Rhythm: Normal rate.  Pulmonary:     Effort: Pulmonary effort is normal.  Abdominal:     Palpations: Abdomen is soft.   Musculoskeletal:     Cervical back: Normal range of motion.  Skin:    Comments: Palpable mass less than 2 cm in diameter under right breast in the inferior medial aspect.  No erythema or drainage noted  Neurological:     Mental Status: She is alert.    ED Results / Procedures / Treatments   Labs (all labs ordered are listed, but only abnormal results are displayed) Labs Reviewed - No data to display  EKG None  Radiology No results found.  Procedures Procedures   Medications Ordered in ED Medications  HYDROcodone-acetaminophen (NORCO/VICODIN) 5-325 MG per tablet 1 tablet (has no administration in time range)  amoxicillin-clavulanate (AUGMENTIN) 875-125 MG per tablet 1 tablet (has no administration in time range)    ED Course/ Medical Decision Making/ A&P                           Medical Decision Making Risk Prescription drug management.   The patient presents with a chief complaint of right breast tenderness.  Differential includes cyst, mastitis, abscess, and others  The area of concern was very firm.  I reviewed previous notes and ultrasound imaging from earlier this year which showed probable cyst in the area of concern.  The area is not fluctuant at this time.  This does not seem like an abscess.  I believe this may be an infection in the cyst.  The area did not seem amenable to an incision and drainage.  I will discharge home with antibiotics and pain medication.  The patient should continue warm compresses.  I recommend follow-up with her GYN who was previously evaluated the area for further evaluation and management  Final Clinical Impression(s) / ED Diagnoses Final diagnoses:  Cyst, breast, right    Rx / DC Orders ED Discharge Orders          Ordered    amoxicillin-clavulanate (AUGMENTIN) 875-125 MG tablet  Every 12 hours        04/15/22 2045    HYDROcodone-acetaminophen (NORCO/VICODIN) 5-325 MG tablet  Every 4 hours PRN        04/15/22 2045               Ronny Bacon 04/15/22 2052    Fredia Sorrow, MD 04/30/22 1534

## 2022-04-15 NOTE — ED Triage Notes (Addendum)
Pt seent from UC for abscess under right breast, abscess area red and swollen no drainage noted.

## 2022-04-15 NOTE — ED Notes (Signed)
Pt reports area under right breast becoming more red, swollen and painful. She states that last year was was found to have a nodule. An MRI was completed in March with no abnormal findings. Pt states she started using a new bath soap that was "burning her." Pt states she stopped using the soap but the area under the right breast continued to get "hotter."

## 2022-04-15 NOTE — Telephone Encounter (Signed)
PT CALLED AND STATED SHE HAS A LUMP ON HER BREAST THAT IS HOT TO THE TOUCH AND WOULD LIKE TO SPEAK TO SOMEONE ABOUT MAYBE GETTING AN ANTIBIOTIC.

## 2022-04-29 ENCOUNTER — Ambulatory Visit: Payer: 59 | Admitting: Adult Health

## 2022-04-30 ENCOUNTER — Encounter (HOSPITAL_COMMUNITY): Payer: Self-pay | Admitting: Hematology

## 2022-05-01 ENCOUNTER — Other Ambulatory Visit: Payer: Self-pay | Admitting: Nurse Practitioner

## 2022-06-08 ENCOUNTER — Other Ambulatory Visit (HOSPITAL_COMMUNITY): Payer: Self-pay | Admitting: Hematology

## 2022-06-11 ENCOUNTER — Other Ambulatory Visit: Payer: Self-pay | Admitting: Internal Medicine

## 2022-06-11 DIAGNOSIS — I10 Essential (primary) hypertension: Secondary | ICD-10-CM

## 2022-07-13 ENCOUNTER — Inpatient Hospital Stay: Payer: 59

## 2022-07-13 ENCOUNTER — Inpatient Hospital Stay: Payer: 59 | Attending: Hematology | Admitting: Hematology

## 2022-07-13 VITALS — BP 155/92 | HR 79 | Temp 97.8°F | Resp 18 | Ht 61.0 in | Wt 139.1 lb

## 2022-07-13 DIAGNOSIS — C20 Malignant neoplasm of rectum: Secondary | ICD-10-CM

## 2022-07-13 DIAGNOSIS — Z85038 Personal history of other malignant neoplasm of large intestine: Secondary | ICD-10-CM | POA: Diagnosis present

## 2022-07-13 DIAGNOSIS — G629 Polyneuropathy, unspecified: Secondary | ICD-10-CM | POA: Diagnosis not present

## 2022-07-13 DIAGNOSIS — Z79899 Other long term (current) drug therapy: Secondary | ICD-10-CM | POA: Insufficient documentation

## 2022-07-13 DIAGNOSIS — Z9221 Personal history of antineoplastic chemotherapy: Secondary | ICD-10-CM | POA: Insufficient documentation

## 2022-07-13 LAB — COMPREHENSIVE METABOLIC PANEL
ALT: 19 U/L (ref 0–44)
AST: 19 U/L (ref 15–41)
Albumin: 3.9 g/dL (ref 3.5–5.0)
Alkaline Phosphatase: 72 U/L (ref 38–126)
Anion gap: 7 (ref 5–15)
BUN: 14 mg/dL (ref 6–20)
CO2: 25 mmol/L (ref 22–32)
Calcium: 8.9 mg/dL (ref 8.9–10.3)
Chloride: 104 mmol/L (ref 98–111)
Creatinine, Ser: 0.96 mg/dL (ref 0.44–1.00)
GFR, Estimated: >60 mL/min (ref >60)
Glucose, Bld: 99 mg/dL (ref 70–99)
Potassium: 3.9 mmol/L (ref 3.5–5.1)
Sodium: 136 mmol/L (ref 135–145)
Total Bilirubin: 0.6 mg/dL (ref 0.3–1.2)
Total Protein: 7.3 g/dL (ref 6.5–8.1)

## 2022-07-13 LAB — CBC WITH DIFFERENTIAL/PLATELET
Abs Immature Granulocytes: 0.01 10*3/uL (ref 0.00–0.07)
Basophils Absolute: 0.1 10*3/uL (ref 0.0–0.1)
Basophils Relative: 1 %
Eosinophils Absolute: 0.1 10*3/uL (ref 0.0–0.5)
Eosinophils Relative: 2 %
HCT: 34.7 % — ABNORMAL LOW (ref 36.0–46.0)
Hemoglobin: 11.7 g/dL — ABNORMAL LOW (ref 12.0–15.0)
Immature Granulocytes: 0 %
Lymphocytes Relative: 27 %
Lymphs Abs: 1.8 10*3/uL (ref 0.7–4.0)
MCH: 32.6 pg (ref 26.0–34.0)
MCHC: 33.7 g/dL (ref 30.0–36.0)
MCV: 96.7 fL (ref 80.0–100.0)
Monocytes Absolute: 0.6 10*3/uL (ref 0.1–1.0)
Monocytes Relative: 9 %
Neutro Abs: 4.2 10*3/uL (ref 1.7–7.7)
Neutrophils Relative %: 61 %
Platelets: 276 10*3/uL (ref 150–400)
RBC: 3.59 MIL/uL — ABNORMAL LOW (ref 3.87–5.11)
RDW: 12.7 % (ref 11.5–15.5)
WBC: 6.9 10*3/uL (ref 4.0–10.5)
nRBC: 0 % (ref 0.0–0.2)

## 2022-07-13 MED ORDER — HEPARIN SOD (PORK) LOCK FLUSH 100 UNIT/ML IV SOLN
500.0000 [IU] | Freq: Once | INTRAVENOUS | Status: AC
  Administered 2022-07-13: 500 [IU] via INTRAVENOUS

## 2022-07-13 MED ORDER — SODIUM CHLORIDE 0.9% FLUSH
10.0000 mL | Freq: Once | INTRAVENOUS | Status: AC
  Administered 2022-07-13: 10 mL via INTRAVENOUS

## 2022-07-13 NOTE — Progress Notes (Signed)
Alexandra Bright, Alexandra Bright   CLINIC:  Medical Oncology/Hematology  PCP:  Lindell Spar, MD [address] / [place] [address] [phone]   REASON FOR VISIT:  Follow-up for sigmoid adenocarcinoma  PRIOR THERAPY: FOLFOX for 6 months in the adjuvant setting  NGS Results: not done  CURRENT THERAPY: Surveillance  BRIEF ONCOLOGIC HISTORY:  Oncology History  Rectal cancer ([place])  04/08/2021 Initial Diagnosis   Rectal cancer ([place])   04/20/2021 - 04/20/2021 Chemotherapy         08/18/2021 - 12/09/2021 Chemotherapy   Patient is on Treatment Plan : COLORECTAL FOLFOX q14d x 4 months       CANCER STAGING:  Cancer Staging  Rectal cancer ([place]) Staging form: Colon and Rectum, AJCC 8th Edition - Clinical stage from 04/08/2021: Stage IIIB (cT3, cN1b, cM0) - Unsigned   INTERVAL HISTORY:  Alexandra Bright, a 56 y.o. female, seen for follow-up of colon cancer.  She is accompanied by her husband today.  Neuropathy is stable.  She is taking gabapentin 3 times daily.  She and her husband report difficulty with recent memory.  She also reports difficulty with the port on the right chest wall when she is wearing seatbelt.  REVIEW OF SYSTEMS:  Review of Systems  Constitutional:  Negative for appetite change and fatigue.  Gastrointestinal:  Negative for abdominal pain, constipation and diarrhea.  Neurological:  Positive for headaches and numbness (fingers and toes).  Psychiatric/Behavioral:  Negative for sleep disturbance.   All other systems reviewed and are negative.   PAST MEDICAL/SURGICAL HISTORY:  Past Medical History:  Diagnosis Date   Anxiety    ASCUS of cervix with negative high risk HPV 02/01/2022   02/01/22 repeat in 1 year per [facility] 5 year CIN3+risk is 0.78%   Colon cancer ([place]) 01/2021   Constipation    GERD (gastroesophageal reflux disease)    Hypertension    Port-A-Cath in place 08/17/2021   Pre-diabetes    Past Surgical  History:  Procedure Laterality Date   BIOPSY  02/18/2021   Procedure: BIOPSY;  Surgeon: Harvel Quale, MD;  [facility];  Service: Gastroenterology;;   BIOPSY  03/05/2022   Procedure: BIOPSY;  Surgeon: Harvel Quale, MD;  [facility];  Service: Gastroenterology;;   COLONOSCOPY WITH PROPOFOL N/A 03/05/2022   Procedure: COLONOSCOPY WITH PROPOFOL;  Surgeon: Harvel Quale, MD;  [facility];  Service: Gastroenterology;  Laterality: N/A;  1040 ASA 1   FACIAL FRACTURE SURGERY  1991   from [place]  02/18/2021   Procedure: FLEXIBLE SIGMOIDOSCOPY;  Surgeon: Harvel Quale, MD;  [facility];  Service: Gastroenterology;;   IR IMAGING GUIDED PORT INSERTION  08/13/2021   POLYPECTOMY  02/18/2021   Procedure: POLYPECTOMY INTESTINAL;  Surgeon: Harvel Quale, MD;  [facility];  Service: Gastroenterology;;   SUBMUCOSAL TATTOO INJECTION  02/18/2021   Procedure: SUBMUCOSAL TATTOO INJECTION;  Surgeon: Harvel Quale, MD;  [facility];  Service: Gastroenterology;;   SUBMUCOSAL TATTOO INJECTION  03/05/2022   Procedure: SUBMUCOSAL TATTOO INJECTION;  Surgeon: Harvel Quale, MD;  [facility];  Service: Gastroenterology;;   XI ROBOTIC ASSISTED LOWER ANTERIOR RESECTION N/A 07/10/2021   Procedure: XI ROBOTIC ASSISTED LOWER ANTERIOR RESECTION;  Surgeon: Leighton Ruff, MD;  Location: [facility];  Service: General;  Laterality: N/A;    SOCIAL HISTORY:  Social History  Socioeconomic History   Marital status: Married    Spouse name: Not on file   Number of children: Not on file   Years of education: Not on file   Highest education level: Not on file  Occupational History   Not on file  Tobacco Use   Smoking status: Former    Packs/day: 0.50    Years: 40.00    Total pack years: 20.00    Types: Cigarettes    Quit date: 2020    Years  since quitting: 3.6   Smokeless tobacco: Never  Vaping Use   Vaping Use: Never used  Substance and Sexual Activity   Alcohol use: Not Currently   Drug use: Never   Sexual activity: Yes    Birth control/protection: Post-menopausal  Other Topics Concern   Not on file  Social History Narrative   Not on file   Social Determinants of Health   Financial Resource Strain: Low Risk  (01/25/2022)   Overall Financial Resource Strain (CARDIA)    Difficulty of Paying Living Expenses: Not hard at all  Food Insecurity: No Food Insecurity (01/25/2022)   Hunger Vital Sign    Worried About Running Out of Food in the Last Year: Never true    [place] in the Last Year: Never true  Transportation Needs: No Transportation Needs (01/25/2022)   PRAPARE - Hydrologist (Medical): No    Lack of Transportation (Non-Medical): No  Physical Activity: Sufficiently Active (01/25/2022)   Exercise Vital Sign    Days of Exercise per Week: 4 days    Minutes of Exercise per Session: 60 min  Stress: No Stress Concern Present (01/25/2022)   [place]    Feeling of Stress : Only a little  Social Connections: Moderately Integrated (01/25/2022)   Social Connection and Isolation Panel [NHANES]    Frequency of Communication with Friends and Family: More than three times a week    Frequency of Social Gatherings with Friends and Family: Three times a week    Attends Religious Services: More than 4 times per year    Active Member of Clubs or Organizations: No    Attends Archivist Meetings: Never    Marital Status: Married  Human resources officer Violence: Not At Risk (01/25/2022)   Humiliation, Afraid, Rape, and Kick questionnaire    Fear of Current or Ex-Partner: No    Emotionally Abused: No    Physically Abused: No    Sexually Abused: No    FAMILY HISTORY:  Family History  Problem Relation Age of Onset   Diabetes  Paternal Grandfather    Diabetes Paternal Grandmother    Cancer Maternal Grandmother    Emphysema Maternal Grandfather    Diabetes Father    Diabetes Sister    Cancer Maternal Aunt    Ovarian cancer Paternal Aunt    Ovarian cancer Cousin    Prostate cancer Cousin     CURRENT MEDICATIONS:  Current Outpatient Medications  Medication Sig Dispense Refill   acetaminophen (TYLENOL) 500 MG tablet Take 1,000 mg by mouth every 6 (six) hours as needed for mild pain, moderate pain or headache.     buPROPion (WELLBUTRIN SR) 200 MG 12 hr tablet Take 1 tablet (200 mg total) by mouth 2 (two) times daily. 60 tablet 5   DIPHENHYDRAMINE-APAP, SLEEP, PO Take 50 mg by mouth at bedtime.     Docusate Calcium (STOOL SOFTENER PO) Take 1 tablet  by mouth daily.     gabapentin (NEURONTIN) 300 MG capsule Take 3 capsules (900 mg total) by mouth 3 (three) times daily. 270 capsule 6   HYDROcodone-acetaminophen (NORCO/VICODIN) 5-325 MG tablet Take 1 tablet by mouth every 4 (four) hours as needed. 15 tablet 0   losartan (COZAAR) 50 MG tablet Take 1 tablet by mouth once daily 90 tablet 0   Multiple Vitamins-Minerals (MULTIVITAMIN WITH MINERALS) tablet Take 1 tablet by mouth in the morning. Women's Multivitamin     simethicone (MYLICON) 80 MG chewable tablet Chew 80 mg by mouth every 6 (six) hours as needed for flatulence.     sucralfate (CARAFATE) 1 g tablet Take 1 tablet (1 g total) by mouth 4 (four) times daily -  with meals and at bedtime. 21 tablet 0   traMADol (ULTRAM) 50 MG tablet TAKE 1 TABLET(50 MG) BY MOUTH THREE TIMES DAILY AS NEEDED 90 tablet 0   vitamin B-12 (CYANOCOBALAMIN) 1000 MCG tablet Take 1,000 mcg by mouth daily.     No current facility-administered medications for this visit.    ALLERGIES:  No Known Allergies  PHYSICAL EXAM:  Performance status (ECOG): 1 - Symptomatic but completely ambulatory  There were no vitals filed for this visit.  Wt Readings from Last 3 Encounters:  04/15/22 137 lb  (62.1 kg)  04/13/22 137 lb (62.1 kg)  03/05/22 138 lb (62.6 kg)   Physical Exam Vitals reviewed.  Constitutional:      Appearance: Normal appearance.  Cardiovascular:     Rate and Rhythm: Normal rate and regular rhythm.     Pulses: Normal pulses.     Heart sounds: Normal heart sounds.  Pulmonary:     Effort: Pulmonary effort is normal.     Breath sounds: Normal breath sounds.  Abdominal:     Palpations: Abdomen is soft. There is no hepatomegaly, splenomegaly or mass.     Tenderness: There is no abdominal tenderness.  Lymphadenopathy:     Lower Body: No right inguinal adenopathy. No left inguinal adenopathy.  Neurological:     General: No focal deficit present.     Mental Status: She is alert and oriented to person, place, and time.  Psychiatric:        Mood and Affect: Mood normal.        Behavior: Behavior normal.     LABORATORY DATA:  I have reviewed the labs as listed.     Latest Ref Rng & Units 04/13/2022   12:45 PM 04/06/2022    8:00 AM 12/31/2021    3:15 PM  CBC  WBC 4.0 - 10.5 K/uL 9.9  5.9  9.6   Hemoglobin 12.0 - 15.0 g/dL 12.4  11.6  11.3   Hematocrit 36.0 - 46.0 % 38.0  35.1  33.6   Platelets 150 - 400 K/uL 271  235  237       Latest Ref Rng & Units 04/13/2022   12:45 PM 04/06/2022    8:00 AM 12/31/2021    3:15 PM  CMP  Glucose 70 - 99 mg/dL 133  84  95   BUN 6 - 20 mg/dL _0 Creatinine 0.44 - 1.00 mg/dL 1.02  0.78  0.94   Sodium 135 - 145 mmol/L 141  138  134   Potassium 3.5 - 5.1 mmol/L 3.7  4.3  4.0   Chloride 98 - 111 mmol/L 109  108  101   CO2 22 - 32 mmol/L 25  25  23   Calcium 8.9 - 10.3 mg/dL 9.6  8.9  9.5   Total Protein 6.5 - 8.1 g/dL 7.5  7.2  7.2   Total Bilirubin 0.3 - 1.2 mg/dL 0.3  0.5  0.7   Alkaline Phos 38 - 126 U/L 91  83  94   AST 15 - 41 U/L _0 ALT 0 - 44 U/L _1 DIAGNOSTIC IMAGING:  I have independently reviewed the scans and discussed with the patient. No results found.   ASSESSMENT:  1.   Rectosigmoid sigmoid adenocarcinoma: - Presentation with the bleeding per rectum for the last 5 to 6 months and constipation. - Colonoscopy on 02/18/2021 showed ulcerated partially obstructing mass found from 12-17 cm from the anal verge, circumferential measuring 5 cm in length. - Biopsy consistent with adenocarcinoma. - CT CAP on 03/06/2021 with circumferential wall thickening, hyperenhancement and fat stranding about the rectosigmoid junction, mass measuring approximately 6 cm in length.  No evidence of lymphadenopathy or metastatic disease. - CEA on 02/19/2021 was 1.8. - MRI of the pelvis on 04/06/2021 shows high rectal T3c/T4AN1 malignancy. - Chemoradiation therapy with Xeloda from 04/22/2021 through 06/01/2021. - CT CAP on 06/29/2021 showed improvement in the rectosigmoid thickening at the site of malignancy.  No clear adenopathy.  No evidence of metastatic disease. - Robotic assisted LAR on 07/10/2021 with pathology showing 2 cm grade 2, margins negative, 1/18 lymph nodes positive, no tumor deposits, positive treatment effect, no perineural invasion, no lymphovascular invasion, [id number], MMR preserved - She will need completion colonoscopy after adjuvant therapy. - 8 cycles of FOLFOX completed on 12/07/2021.   2.  Social/family history: - She worked as a Secretary/administrator and recently stopped working.  She quit smoking in October 2021, smoked 1 pack/day for 40 years. - Maternal aunt had lung cancer and was a non-smoker.  Paternal aunt had cancer, type not known to the patient.   PLAN:  1.  T3c/T4N1 rectosigmoid  adenocarcinoma: - She does not report any change in bowel habits.  No bleeding per rectum or melena. - Reviewed labs from 07/13/2022 which showed normal LFTs and near normal CBC.  CEA is pending from today.  Last CEA was 3.9. - I have recommended follow-up in 3 months with repeat CTAP with contrast, CEA and other labs. - If she does not have any evidence of recurrence, she will proceed with port  removal by IR. - She is also complaining of loss of recent memory.  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  Behavioral interventions (physical exercise, brain training programs) and CNS stimulants like modafinil and methylphenidate have shown that they may improve cognitive function. - She reports to me that she already made an appointment to see Dr. Posey Pronto for dementia testing.   2.  Peripheral neuropathy: - Neuropathy is stable.  Continue gabapentin 1 tablet in the morning 1 tablet in the afternoon and 2 tablets at bedtime.   Orders placed this encounter:  No orders of the defined types were placed in this encounter.    Derek Jack, MD [place] [phone]

## 2022-07-13 NOTE — Patient Instructions (Signed)
[place] at [facility] Discharge Instructions  You were seen and examined today by Dr. Delton Coombes.  Dr. Delton Coombes discussed your most recent lab work and everything that is back looks good. We do not have the CEA results yet.   Follow-up as scheduled in 3 months with port flush and labs.    Thank you for choosing [place] at [facility] to provide your oncology and hematology care.  To afford each patient quality time with our provider, please arrive at least 15 minutes before your scheduled appointment time.   If you have a lab appointment with the [facility] please come in thru the Main Entrance and check in at the main information desk.  You need to re-schedule your appointment should you arrive 10 or more minutes late.  We strive to give you quality time with our providers, and arriving late affects you and other patients whose appointments are after yours.  Also, if you no show three or more times for appointments you may be dismissed from the clinic at the providers discretion.     Again, thank you for choosing [facility].  Our hope is that these requests will decrease the amount of time that you wait before being seen by our physicians.       _____________________________________________________________  Should you have questions after your visit to [facility], please contact our office at [phone] and follow the prompts.  Our office hours are 8:00 a.m. and 4:30 p.m. Monday - Friday.  Please note that voicemails left after 4:00 p.m. may not be returned until the following business day.  We are closed weekends and major holidays.  You do have access to a nurse 24-7, just call the main number to the clinic [phone] and do not press any options, hold on the line and a nurse will answer the phone.    For prescription refill requests, have your pharmacy contact our office and allow 72 hours.

## 2022-07-14 LAB — CEA: CEA: 2.7 ng/mL (ref 0.0–4.7)

## 2022-07-19 ENCOUNTER — Other Ambulatory Visit (HOSPITAL_COMMUNITY): Payer: Self-pay | Admitting: Hematology

## 2022-08-03 ENCOUNTER — Other Ambulatory Visit: Payer: Self-pay

## 2022-08-03 ENCOUNTER — Other Ambulatory Visit: Payer: Self-pay | Admitting: *Deleted

## 2022-08-03 MED ORDER — GABAPENTIN 300 MG PO CAPS: 300.0000 mg | ORAL_CAPSULE | Freq: Three times a day (TID) | ORAL | 6 refills | Status: DC

## 2022-08-10 ENCOUNTER — Other Ambulatory Visit: Payer: Self-pay

## 2022-08-10 MED ORDER — TRAMADOL HCL 50 MG PO TABS: ORAL_TABLET | ORAL | 0 refills | Status: DC

## 2022-08-10 NOTE — Progress Notes (Signed)
Prior authorization for Tramadol 50 mg tablets approved via CoverMyMeds on 08/10/2022.

## 2022-08-11 ENCOUNTER — Encounter: Payer: Self-pay | Admitting: *Deleted

## 2022-08-11 NOTE — Progress Notes (Signed)
Approval from [employer] received via Cover My Meds.  No timeframe given.

## 2022-08-16 ENCOUNTER — Encounter (INDEPENDENT_AMBULATORY_CARE_PROVIDER_SITE_OTHER): Payer: Self-pay | Admitting: *Deleted

## 2022-08-18 ENCOUNTER — Encounter: Payer: Self-pay | Admitting: Internal Medicine

## 2022-08-18 ENCOUNTER — Other Ambulatory Visit: Payer: Self-pay | Admitting: Internal Medicine

## 2022-08-18 ENCOUNTER — Ambulatory Visit (INDEPENDENT_AMBULATORY_CARE_PROVIDER_SITE_OTHER): Payer: 59 | Admitting: Internal Medicine

## 2022-08-18 VITALS — BP 138/84 | HR 92 | Resp 18 | Ht 61.0 in | Wt 140.8 lb

## 2022-08-18 DIAGNOSIS — R69 Illness, unspecified: Secondary | ICD-10-CM | POA: Diagnosis not present

## 2022-08-18 DIAGNOSIS — M654 Radial styloid tenosynovitis [de Quervain]: Secondary | ICD-10-CM | POA: Diagnosis not present

## 2022-08-18 DIAGNOSIS — F419 Anxiety disorder, unspecified: Secondary | ICD-10-CM

## 2022-08-18 DIAGNOSIS — L309 Dermatitis, unspecified: Secondary | ICD-10-CM | POA: Diagnosis not present

## 2022-08-18 DIAGNOSIS — I10 Essential (primary) hypertension: Secondary | ICD-10-CM | POA: Diagnosis not present

## 2022-08-18 DIAGNOSIS — G609 Hereditary and idiopathic neuropathy, unspecified: Secondary | ICD-10-CM | POA: Diagnosis not present

## 2022-08-18 MED ORDER — PREGABALIN 50 MG PO CAPS: 50.0000 mg | ORAL_CAPSULE | Freq: Two times a day (BID) | ORAL | 2 refills | Status: DC

## 2022-08-18 NOTE — Progress Notes (Signed)
Established Patient Office Visit  Subjective:  Patient ID: Alexandra Bright, female    DOB: Apr 19, 1966  Age: 56 y.o. MRN: [id number]  CC:  Chief Complaint  Patient presents with   Follow-up    6 month follow p pt has neuropathy fingers and toes feel like they are vibrating all the time has no strength she also cannot bend right thumb when she does it pops would like dementia test also has eczema on right leg has been to dermatologist and the creams do not work     HPI Alexandra Bright is a 56 y.o. female with past medical history of anxiety, HTN and colon adenocarcinoma who presents for follow up of her chronic medical conditions. Her husband is present during the visit.  Peripheral neuropathy: She has neuropathic pain in her UE and LE.  She had been taking gabapentin 300 mg 3 times daily for neuropathy with minimal relief. She has been taking Gabapentin only QD as it has not helped much anyway.  Denies any recent injury. Her neuropathy symptoms started after she completed her chemotherapy for her rectal cancer. She has been taking Tramadol PRN for pain as well. She has had weakness of the hands and is not able to write at her regular speed.  Right thumb pain: She complains of right thumb pain X 2 weeks.  Her pain is constant, on the lateral side of the base of the thumb and over the lateral side of the wrist, sharp, worse with movement.  She has chronic numbness of the hands.  Denies any recent injury.  GAD: She also complains of spells of anxiety despite taking Wellbutrin currently.  She did not tolerate SSRI in the past.  She denies any anhedonia, SI or HI currently.  Eczema: She has an erythematous skin patch over right leg, which is chronic and flares up on an intermittent basis.  She has seen 2 dermatologists and has been placed on different steroid creams for eczema without much relief.  Memory concern: Her husband is concerned about her memory/cognitive decline.  Her MMSE was 30/30  today.  She is independent with her ADLs and IADLs currently.   Past Medical History:  Diagnosis Date   Anxiety    ASCUS of cervix with negative high risk HPV 02/01/2022   02/01/22 repeat in 1 year per [facility] 5 year CIN3+risk is 0.78%   Colon cancer ([facility]) 01/2021   Constipation    GERD (gastroesophageal reflux disease)    Hypertension    Port-A-Cath in place 08/17/2021   Pre-diabetes     Past Surgical History:  Procedure Laterality Date   BIOPSY  02/18/2021   Procedure: BIOPSY;  Surgeon: Harvel Quale, MD;  [facility];  Service: Gastroenterology;;   BIOPSY  03/05/2022   Procedure: BIOPSY;  Surgeon: Harvel Quale, MD;  [facility];  Service: Gastroenterology;;   COLONOSCOPY WITH PROPOFOL N/A 03/05/2022   Procedure: COLONOSCOPY WITH PROPOFOL;  Surgeon: Harvel Quale, MD;  [facility];  Service: Gastroenterology;  Laterality: N/A;  1040 ASA 1   FACIAL FRACTURE SURGERY  1991   from [facility]  02/18/2021   Procedure: FLEXIBLE SIGMOIDOSCOPY;  Surgeon: Harvel Quale, MD;  [facility];  Service: Gastroenterology;;   IR IMAGING GUIDED PORT INSERTION  08/13/2021   POLYPECTOMY  02/18/2021   Procedure: POLYPECTOMY INTESTINAL;  Surgeon: Harvel Quale, MD;  [facility];  Service: Gastroenterology;;  SUBMUCOSAL TATTOO INJECTION  02/18/2021   Procedure: SUBMUCOSAL TATTOO INJECTION;  Surgeon: Montez Morita, Quillian Quince, MD;  [facility];  Service: Gastroenterology;;   SUBMUCOSAL TATTOO INJECTION  03/05/2022   Procedure: SUBMUCOSAL TATTOO INJECTION;  Surgeon: Montez Morita, Quillian Quince, MD;  [facility];  Service: Gastroenterology;;   XI ROBOTIC ASSISTED LOWER ANTERIOR RESECTION N/A 07/10/2021   Procedure: XI ROBOTIC ASSISTED LOWER ANTERIOR RESECTION;  Surgeon: Leighton Ruff, MD;  Location: [facility];  Service: General;  Laterality: N/A;     Family History  Problem Relation Age of Onset   Diabetes Paternal Grandfather    Diabetes Paternal Grandmother    Cancer Maternal Grandmother    Emphysema Maternal Grandfather    Diabetes Father    Diabetes Sister    Cancer Maternal Aunt    Ovarian cancer Paternal Aunt    Ovarian cancer Cousin    Prostate cancer Cousin     Social History   Socioeconomic History   Marital status: Married    Spouse name: Not on file   Number of children: Not on file   Years of education: Not on file   Highest education level: Not on file  Occupational History   Not on file  Tobacco Use   Smoking status: Former    Packs/day: 0.50    Years: 40.00    Total pack years: 20.00    Types: Cigarettes    Quit date: 2020    Years since quitting: 3.7   Smokeless tobacco: Never  Vaping Use   Vaping Use: Never used  Substance and Sexual Activity   Alcohol use: Not Currently   Drug use: Never   Sexual activity: Yes    Birth control/protection: Post-menopausal  Other Topics Concern   Not on file  Social History Narrative   Not on file   Social Determinants of Health   Financial Resource Strain: Low Risk  (01/25/2022)   Overall Financial Resource Strain (CARDIA)    Difficulty of Paying Living Expenses: Not hard at all  Food Insecurity: No Food Insecurity (01/25/2022)   Hunger Vital Sign    Worried About Running Out of Food in the Last Year: Never true    Ran Out of Food in the Last Year: Never true  Transportation Needs: No Transportation Needs (01/25/2022)   PRAPARE - Hydrologist (Medical): No    Lack of Transportation (Non-Medical): No  Physical Activity: Sufficiently Active (01/25/2022)   Exercise Vital Sign    Days of Exercise per Week: 4 days    Minutes of Exercise per Session: 60 min  Stress: No Stress Concern Present (01/25/2022)   [place]    Feeling of Stress : Only a little  Social  Connections: Moderately Integrated (01/25/2022)   Social Connection and Isolation Panel [NHANES]    Frequency of Communication with Friends and Family: More than three times a week    Frequency of Social Gatherings with Friends and Family: Three times a week    Attends Religious Services: More than 4 times per year    Active Member of Clubs or Organizations: No    Attends Archivist Meetings: Never    Marital Status: Married  Human resources officer Violence: Not At Risk (01/25/2022)   Humiliation, Afraid, Rape, and Kick questionnaire    Fear of Current or Ex-Partner: No    Emotionally Abused: No    Physically Abused: No    Sexually Abused: No  Outpatient Medications Prior to Visit  Medication Sig Dispense Refill   acetaminophen (TYLENOL) 500 MG tablet Take 1,000 mg by mouth every 6 (six) hours as needed for mild pain, moderate pain or headache.     buPROPion (WELLBUTRIN SR) 200 MG 12 hr tablet Take 1 tablet by mouth twice daily 60 tablet 2   DIPHENHYDRAMINE-APAP, SLEEP, PO Take 50 mg by mouth at bedtime.     Docusate Calcium (STOOL SOFTENER PO) Take 1 tablet by mouth daily.     HYDROcodone-acetaminophen (NORCO/VICODIN) 5-325 MG tablet Take 1 tablet by mouth every 4 (four) hours as needed. 15 tablet 0   losartan (COZAAR) 50 MG tablet Take 1 tablet by mouth once daily 90 tablet 0   Multiple Vitamins-Minerals (MULTIVITAMIN WITH MINERALS) tablet Take 1 tablet by mouth in the morning. Women's Multivitamin     simethicone (MYLICON) 80 MG chewable tablet Chew 80 mg by mouth every 6 (six) hours as needed for flatulence.     sucralfate (CARAFATE) 1 g tablet Take 1 tablet (1 g total) by mouth 4 (four) times daily -  with meals and at bedtime. 21 tablet 0   traMADol (ULTRAM) 50 MG tablet TAKE 1 TABLET(50 MG) BY MOUTH THREE TIMES DAILY AS NEEDED 90 tablet 0   vitamin B-12 (CYANOCOBALAMIN) 1000 MCG tablet Take 1,000 mcg by mouth daily.     gabapentin (NEURONTIN) 300 MG capsule Take 1 capsule (300  mg total) by mouth 3 (three) times daily. 90 capsule 6   No facility-administered medications prior to visit.    No Known Allergies  ROS Review of Systems  Constitutional:  Negative for chills and fever.  HENT:  Negative for congestion, sinus pressure, sinus pain and sore throat.   Eyes:  Negative for pain and discharge.  Respiratory:  Negative for cough and shortness of breath.   Cardiovascular:  Negative for chest pain and palpitations.  Gastrointestinal:  Positive for constipation and diarrhea. Negative for abdominal pain, nausea and vomiting.  Endocrine: Negative for polydipsia and polyuria.  Genitourinary:  Negative for dysuria and hematuria.  Musculoskeletal:  Positive for arthralgias and back pain. Negative for neck pain and neck stiffness.  Skin:  Negative for rash.  Neurological:  Positive for weakness and numbness. Negative for dizziness, seizures and syncope.  Psychiatric/Behavioral:  Positive for sleep disturbance. Negative for agitation and behavioral problems. The patient is nervous/anxious.       Objective:    Physical Exam Vitals reviewed.  Constitutional:      General: She is not in acute distress.    Appearance: She is not diaphoretic.  HENT:     Head: Normocephalic and atraumatic.     Nose: Nose normal.     Mouth/Throat:     Mouth: Mucous membranes are moist.  Eyes:     General: No scleral icterus.    Extraocular Movements: Extraocular movements intact.  Cardiovascular:     Rate and Rhythm: Normal rate and regular rhythm.     Heart sounds: Normal heart sounds. No murmur heard. Pulmonary:     Breath sounds: Normal breath sounds. No wheezing or rales.  Musculoskeletal:     Right hand: Tenderness (Near base of thumb and thenar eminence) present. Decreased strength.     Left hand: Decreased strength.     Cervical back: Neck supple. No tenderness.     Right lower leg: No edema.     Left lower leg: No edema.  Skin:    General: Skin is warm.  Findings: Rash (Erythematous patch over extensor surface of right leg) present.  Neurological:     General: No focal deficit present.     Mental Status: She is alert and oriented to person, place, and time.     Sensory: No sensory deficit.     Motor: Weakness (B/l UE - 4/5, b/l LE - 4/5) present.  Psychiatric:        Mood and Affect: Mood normal.        Behavior: Behavior normal.     BP 138/84 (BP Location: Right Arm, Patient Position: Sitting, Cuff Size: Normal)   Pulse 92   Resp 18   Ht '5\' 1"'$  (1.549 m)   Wt 140 lb 12.8 oz (63.9 kg)   SpO2 97%   BMI 26.60 kg/m  Wt Readings from Last 3 Encounters:  08/18/22 140 lb 12.8 oz (63.9 kg)  07/13/22 139 lb 1.6 oz (63.1 kg)  04/15/22 137 lb (62.1 kg)    Lab Results  Component Value Date   TSH 1.465 04/06/2022   Lab Results  Component Value Date   WBC 6.9 07/13/2022   HGB 11.7 (L) 07/13/2022   HCT 34.7 (L) 07/13/2022   MCV 96.7 07/13/2022   PLT 276 07/13/2022   Lab Results  Component Value Date   NA 136 07/13/2022   K 3.9 07/13/2022   CO2 25 07/13/2022   GLUCOSE 99 07/13/2022   BUN 14 07/13/2022   CREATININE 0.96 07/13/2022   BILITOT 0.6 07/13/2022   ALKPHOS 72 07/13/2022   AST 19 07/13/2022   ALT 19 07/13/2022   PROT 7.3 07/13/2022   ALBUMIN 3.9 07/13/2022   CALCIUM 8.9 07/13/2022   ANIONGAP 7 07/13/2022   Lab Results  Component Value Date   CHOL 198 11/27/2020   Lab Results  Component Value Date   HDL 59 11/27/2020   Lab Results  Component Value Date   LDLCALC 112 (H) 11/27/2020   Lab Results  Component Value Date   TRIG 154 (H) 11/27/2020   Lab Results  Component Value Date   CHOLHDL 3.4 11/27/2020   Lab Results  Component Value Date   HGBA1C 5.2 06/30/2021      Assessment & Plan:   Problem List Items Addressed This Visit       Cardiovascular and Mediastinum   Hypertension    BP Readings from Last 1 Encounters:  08/18/22 138/84  Well-controlled with Losartan Counseled for compliance  with the medications Advised DASH diet and moderate exercise/walking, at least 150 mins/week        Nervous and Auditory   Idiopathic peripheral neuropathy - Primary    Pins-and-needles sensation in bilateral UE and LE Has tried gabapentin up to 600 mg TID Still persistent, switched to Lyrica 50 mg BID Tramadol PRN, currently gets from Oncology - I agreed to refill when needed      Relevant Medications   pregabalin (LYRICA) 50 MG capsule     Musculoskeletal and Integument   Eczema    Rash likely eczematous Kenalog cream for now Referred to Dr Nevada Crane for second opinion as there is concern for psoriasis      Relevant Orders   Ambulatory referral to Dermatology   Tenosynovitis, de Quervain    Her right thumb pain likely de Quervain tenosynovitis Ice and wrist brace advised Referred to Hand surgeon      Relevant Orders   Ambulatory referral to Hand Surgery     Other   Anxiety    Better with Wellbutrin  200 mg twice daily for now Did not tolerate SSRI in the past Would avoid BZD for now as she is on Gabapentin       Meds ordered this encounter  Medications   pregabalin (LYRICA) 50 MG capsule    Sig: Take 1 capsule (50 mg total) by mouth 2 (two) times daily.    Dispense:  60 capsule    Refill:  2    Follow-up: Return in about 3 months (around 11/17/2022) for Neuropathy and eczema.    Lindell Spar, MD

## 2022-08-18 NOTE — Patient Instructions (Signed)
Please take Lyrica instead of Gabapentin.  Please wear hand brace and take Tylenol/Tramadol as needed for hand pain.  Please continue to take other medications as prescribed.

## 2022-08-20 DIAGNOSIS — M654 Radial styloid tenosynovitis [de Quervain]: Secondary | ICD-10-CM | POA: Insufficient documentation

## 2022-08-20 NOTE — Assessment & Plan Note (Signed)
Her right thumb pain likely de Quervain tenosynovitis Ice and wrist brace advised Referred to Hand surgeon

## 2022-08-20 NOTE — Assessment & Plan Note (Signed)
Better with Wellbutrin 200 mg twice daily for now Did not tolerate SSRI in the past Would avoid BZD for now as she is on Gabapentin

## 2022-08-20 NOTE — Assessment & Plan Note (Addendum)
BP Readings from Last 1 Encounters:  08/18/22 138/84   Well-controlled with Losartan Counseled for compliance with the medications Advised DASH diet and moderate exercise/walking, at least 150 mins/week

## 2022-08-20 NOTE — Assessment & Plan Note (Signed)
Rash likely eczematous Kenalog cream for now Referred to Dr Hall for second opinion as there is concern for psoriasis 

## 2022-08-20 NOTE — Assessment & Plan Note (Addendum)
Pins-and-needles sensation in bilateral UE and LE Has tried gabapentin up to 600 mg TID Still persistent, switched to Lyrica 50 mg BID Tramadol PRN, currently gets from Oncology - I agreed to refill when needed

## 2022-08-23 ENCOUNTER — Telehealth: Payer: Self-pay | Admitting: Internal Medicine

## 2022-08-23 NOTE — Telephone Encounter (Signed)
This is the address and phone number for Alexandra Bright please let patient know   Address: [address], [place], [place] [address] Phone: [phone]

## 2022-08-23 NOTE — Telephone Encounter (Signed)
Patient called in regard to referral to Dr. Tempie Donning.  Patient states that Tempie Donning has moved offices. Patient wants to know if provider wants her to go to old office or to the new location.  Patient wants a call back in regard.

## 2022-08-31 DIAGNOSIS — M65332 Trigger finger, left middle finger: Secondary | ICD-10-CM | POA: Diagnosis not present

## 2022-08-31 DIAGNOSIS — M79644 Pain in right finger(s): Secondary | ICD-10-CM | POA: Diagnosis not present

## 2022-08-31 DIAGNOSIS — M65311 Trigger thumb, right thumb: Secondary | ICD-10-CM | POA: Diagnosis not present

## 2022-08-31 DIAGNOSIS — M65331 Trigger finger, right middle finger: Secondary | ICD-10-CM | POA: Diagnosis not present

## 2022-09-08 ENCOUNTER — Other Ambulatory Visit: Payer: Self-pay | Admitting: Family Medicine

## 2022-09-08 DIAGNOSIS — I10 Essential (primary) hypertension: Secondary | ICD-10-CM

## 2022-09-09 ENCOUNTER — Other Ambulatory Visit (INDEPENDENT_AMBULATORY_CARE_PROVIDER_SITE_OTHER): Payer: Self-pay

## 2022-09-09 DIAGNOSIS — C19 Malignant neoplasm of rectosigmoid junction: Secondary | ICD-10-CM

## 2022-09-20 ENCOUNTER — Encounter (INDEPENDENT_AMBULATORY_CARE_PROVIDER_SITE_OTHER): Payer: Self-pay

## 2022-09-20 ENCOUNTER — Telehealth (INDEPENDENT_AMBULATORY_CARE_PROVIDER_SITE_OTHER): Payer: Self-pay

## 2022-09-20 MED ORDER — PEG 3350-KCL-NA BICARB-NACL 420 G PO SOLR: 4000.0000 mL | ORAL | 0 refills | Status: DC

## 2022-09-20 NOTE — Telephone Encounter (Signed)
Referring MD/PCP: Dr Posey Pronto   Procedure: Flex Sigmoidoscopy  Reason/Indication:  RECALL   Has patient had this procedure before?  Yes   If so, when, by whom and where?  03/05/22  Is there a family history of colon cancer?  No just myself  Who?  What age when diagnosed?    Is patient diabetic? If yes, Type 1 or Type 2   No       Does patient have prosthetic heart valve or mechanical valve?  No   Do you have a pacemaker/defibrillator?  No   Has patient ever had endocarditis/atrial fibrillation? No   Does patient use oxygen? No   Has patient had joint replacement within last 12 months?  No   Is patient constipated or do they take laxatives? Yes   Does patient have a history of alcohol/drug use?  Yes  Have you had a stroke/heart attack last 6 mths? No   Do you take medicine for weight loss?  No   For female patients,: have you had a hysterectomy No                       are you post menopausal yes                      do you still have your menstrual cycle no   Is patient on blood thinner such as Coumadin, Plavix and/or Aspirin? No   Medications: Bupropion 200 mg bid, Vit B12 1000 mcg daily, Diphenhydramine (sleep) 50 mg daily, losartan 50 mg daily, MVI daily, pregabalin 50 mg bid, stool softener once daily, tramadol 50 mg prn 1 or 2 daily  Allergies: NKDA  Medication Adjustment per Dr Jenetta Downer none   Procedure date & time: 10/22/22 at 10:00

## 2022-09-20 NOTE — Telephone Encounter (Signed)
Alexandra Bright, CMA  ?

## 2022-09-24 ENCOUNTER — Telehealth (INDEPENDENT_AMBULATORY_CARE_PROVIDER_SITE_OTHER): Payer: Self-pay | Admitting: *Deleted

## 2022-09-27 NOTE — Telephone Encounter (Signed)
error 

## 2022-10-12 ENCOUNTER — Inpatient Hospital Stay: Payer: 59 | Attending: Hematology

## 2022-10-12 VITALS — BP 132/96 | HR 81 | Temp 96.9°F | Resp 18

## 2022-10-12 DIAGNOSIS — Z85038 Personal history of other malignant neoplasm of large intestine: Secondary | ICD-10-CM | POA: Insufficient documentation

## 2022-10-12 DIAGNOSIS — G629 Polyneuropathy, unspecified: Secondary | ICD-10-CM | POA: Diagnosis not present

## 2022-10-12 DIAGNOSIS — C20 Malignant neoplasm of rectum: Secondary | ICD-10-CM

## 2022-10-12 DIAGNOSIS — Z9221 Personal history of antineoplastic chemotherapy: Secondary | ICD-10-CM | POA: Insufficient documentation

## 2022-10-12 DIAGNOSIS — Z452 Encounter for adjustment and management of vascular access device: Secondary | ICD-10-CM | POA: Insufficient documentation

## 2022-10-12 DIAGNOSIS — Z79899 Other long term (current) drug therapy: Secondary | ICD-10-CM | POA: Diagnosis not present

## 2022-10-12 DIAGNOSIS — Z95828 Presence of other vascular implants and grafts: Secondary | ICD-10-CM

## 2022-10-12 LAB — CBC WITH DIFFERENTIAL/PLATELET
Abs Immature Granulocytes: 0.01 10*3/uL (ref 0.00–0.07)
Basophils Absolute: 0 10*3/uL (ref 0.0–0.1)
Basophils Relative: 1 %
Eosinophils Absolute: 0.1 10*3/uL (ref 0.0–0.5)
Eosinophils Relative: 1 %
HCT: 38 % (ref 36.0–46.0)
Hemoglobin: 12.8 g/dL (ref 12.0–15.0)
Immature Granulocytes: 0 %
Lymphocytes Relative: 25 %
Lymphs Abs: 2 10*3/uL (ref 0.7–4.0)
MCH: 32.2 pg (ref 26.0–34.0)
MCHC: 33.7 g/dL (ref 30.0–36.0)
MCV: 95.7 fL (ref 80.0–100.0)
Monocytes Absolute: 0.5 10*3/uL (ref 0.1–1.0)
Monocytes Relative: 6 %
Neutro Abs: 5.3 10*3/uL (ref 1.7–7.7)
Neutrophils Relative %: 67 %
Platelets: 229 10*3/uL (ref 150–400)
RBC: 3.97 MIL/uL (ref 3.87–5.11)
RDW: 12.8 % (ref 11.5–15.5)
WBC: 7.9 10*3/uL (ref 4.0–10.5)
nRBC: 0 % (ref 0.0–0.2)

## 2022-10-12 LAB — COMPREHENSIVE METABOLIC PANEL
ALT: 24 U/L (ref 0–44)
AST: 27 U/L (ref 15–41)
Albumin: 4.1 g/dL (ref 3.5–5.0)
Alkaline Phosphatase: 76 U/L (ref 38–126)
Anion gap: 5 (ref 5–15)
BUN: 10 mg/dL (ref 6–20)
CO2: 25 mmol/L (ref 22–32)
Calcium: 9.1 mg/dL (ref 8.9–10.3)
Chloride: 108 mmol/L (ref 98–111)
Creatinine, Ser: 0.77 mg/dL (ref 0.44–1.00)
GFR, Estimated: >60 mL/min (ref >60)
Glucose, Bld: 100 mg/dL — ABNORMAL HIGH (ref 70–99)
Potassium: 3.8 mmol/L (ref 3.5–5.1)
Sodium: 138 mmol/L (ref 135–145)
Total Bilirubin: 0.5 mg/dL (ref 0.3–1.2)
Total Protein: 7.4 g/dL (ref 6.5–8.1)

## 2022-10-12 MED ORDER — HEPARIN SOD (PORK) LOCK FLUSH 100 UNIT/ML IV SOLN
500.0000 [IU] | Freq: Once | INTRAVENOUS | Status: AC
  Administered 2022-10-12: 500 [IU] via INTRAVENOUS

## 2022-10-12 MED ORDER — SODIUM CHLORIDE 0.9% FLUSH
10.0000 mL | Freq: Once | INTRAVENOUS | Status: AC
  Administered 2022-10-12: 10 mL via INTRAVENOUS

## 2022-10-12 NOTE — Patient Instructions (Signed)
[facility]  Discharge Instructions: Thank you for choosing [facility] to provide your oncology and hematology care.  If you have a lab appointment with the [facility], please come in thru the Main Entrance and check in at the main information desk.  Wear comfortable clothing and clothing appropriate for easy access to any Portacath or PICC line.   We strive to give you quality time with your provider. You may need to reschedule your appointment if you arrive late (15 or more minutes).  Arriving late affects you and other patients whose appointments are after yours.  Also, if you miss three or more appointments without notifying the office, you may be dismissed from the clinic at the provider's discretion.      For prescription refill requests, have your pharmacy contact our office and allow 72 hours for refills to be completed.    Today you received the following port flushed and labs drawn, return as scheduled.   To help prevent nausea and vomiting after your treatment, we encourage you to take your nausea medication as directed.  BELOW ARE SYMPTOMS THAT SHOULD BE REPORTED IMMEDIATELY: *FEVER GREATER THAN 100.4 F (38 C) OR HIGHER *CHILLS OR SWEATING *NAUSEA AND VOMITING THAT IS NOT CONTROLLED WITH YOUR NAUSEA MEDICATION *UNUSUAL SHORTNESS OF BREATH *UNUSUAL BRUISING OR BLEEDING *URINARY PROBLEMS (pain or burning when urinating, or frequent urination) *BOWEL PROBLEMS (unusual diarrhea, constipation, pain near the anus) TENDERNESS IN MOUTH AND THROAT WITH OR WITHOUT PRESENCE OF ULCERS (sore throat, sores in mouth, or a toothache) UNUSUAL RASH, SWELLING OR PAIN  UNUSUAL VAGINAL DISCHARGE OR ITCHING   Items with * indicate a potential emergency and should be followed up as soon as possible or go to the Emergency Department if any problems should occur.  Please show the CHEMOTHERAPY ALERT CARD or IMMUNOTHERAPY ALERT CARD at check-in to the Emergency  Department and triage nurse.  Should you have questions after your visit or need to cancel or reschedule your appointment, please contact [facility] [phone]  and follow the prompts.  Office hours are 8:00 a.m. to 4:30 p.m. Monday - Friday. Please note that voicemails left after 4:00 p.m. may not be returned until the following business day.  We are closed weekends and major holidays. You have access to a nurse at all times for urgent questions. Please call the main number to the clinic [phone] and follow the prompts.  For any non-urgent questions, you may also contact your provider using MyChart. We now offer e-Visits for anyone 56 and older to request care online for non-urgent symptoms. For details visit [email or web address].   Also download the MyChart app! Go to the app store, search "MyChart", open the app, select [facility], and log in with your MyChart username and password.  Masks are optional in the cancer centers. If you would like for your care team to wear a mask while they are taking care of you, please let them know. You may have one support person who is at least 56 years old accompany you for your appointments.

## 2022-10-12 NOTE — Progress Notes (Signed)
Port flushed with good blood return noted, labs drawn. No bruising or swelling at site. Bandaid applied and patient discharged in satisfactory condition. VVS stable with no signs or symptoms of distressed noted. 

## 2022-10-13 ENCOUNTER — Other Ambulatory Visit: Payer: 59

## 2022-10-13 ENCOUNTER — Ambulatory Visit (HOSPITAL_COMMUNITY): Payer: 59

## 2022-10-13 LAB — CEA: CEA: 2.6 ng/mL (ref 0.0–4.7)

## 2022-10-18 ENCOUNTER — Other Ambulatory Visit: Payer: Self-pay

## 2022-10-18 MED ORDER — TRAMADOL HCL 50 MG PO TABS: ORAL_TABLET | ORAL | 0 refills | Status: DC

## 2022-10-20 ENCOUNTER — Ambulatory Visit: Payer: 59 | Admitting: Hematology

## 2022-10-22 ENCOUNTER — Other Ambulatory Visit: Payer: Self-pay

## 2022-10-22 ENCOUNTER — Ambulatory Visit (HOSPITAL_BASED_OUTPATIENT_CLINIC_OR_DEPARTMENT_OTHER): Payer: 59 | Admitting: Anesthesiology

## 2022-10-22 ENCOUNTER — Encounter (HOSPITAL_COMMUNITY)
Admission: RE | Disposition: A | Discharge status: Home / Self Care | Payer: Self-pay | Source: Home / Self Care | Attending: Gastroenterology

## 2022-10-22 ENCOUNTER — Ambulatory Visit (HOSPITAL_COMMUNITY)
Admission: RE | Admit: 2022-10-22 | Discharge: 2022-10-22 | Disposition: A | Discharge status: Home / Self Care | Payer: 59 | Attending: Gastroenterology | Admitting: Gastroenterology

## 2022-10-22 ENCOUNTER — Ambulatory Visit (HOSPITAL_COMMUNITY): Payer: 59 | Admitting: Anesthesiology

## 2022-10-22 ENCOUNTER — Encounter (HOSPITAL_COMMUNITY): Payer: Self-pay | Admitting: Gastroenterology

## 2022-10-22 DIAGNOSIS — Z85048 Personal history of other malignant neoplasm of rectum, rectosigmoid junction, and anus: Secondary | ICD-10-CM

## 2022-10-22 DIAGNOSIS — Z1211 Encounter for screening for malignant neoplasm of colon: Secondary | ICD-10-CM | POA: Insufficient documentation

## 2022-10-22 DIAGNOSIS — C20 Malignant neoplasm of rectum: Secondary | ICD-10-CM

## 2022-10-22 DIAGNOSIS — R7303 Prediabetes: Secondary | ICD-10-CM | POA: Diagnosis not present

## 2022-10-22 DIAGNOSIS — R69 Illness, unspecified: Secondary | ICD-10-CM | POA: Diagnosis not present

## 2022-10-22 DIAGNOSIS — I10 Essential (primary) hypertension: Secondary | ICD-10-CM

## 2022-10-22 DIAGNOSIS — Z980 Intestinal bypass and anastomosis status: Secondary | ICD-10-CM | POA: Insufficient documentation

## 2022-10-22 DIAGNOSIS — C19 Malignant neoplasm of rectosigmoid junction: Secondary | ICD-10-CM

## 2022-10-22 DIAGNOSIS — K219 Gastro-esophageal reflux disease without esophagitis: Secondary | ICD-10-CM | POA: Insufficient documentation

## 2022-10-22 DIAGNOSIS — Z87891 Personal history of nicotine dependence: Secondary | ICD-10-CM | POA: Diagnosis not present

## 2022-10-22 DIAGNOSIS — K589 Irritable bowel syndrome without diarrhea: Secondary | ICD-10-CM | POA: Diagnosis not present

## 2022-10-22 DIAGNOSIS — K648 Other hemorrhoids: Secondary | ICD-10-CM | POA: Insufficient documentation

## 2022-10-22 DIAGNOSIS — F419 Anxiety disorder, unspecified: Secondary | ICD-10-CM | POA: Insufficient documentation

## 2022-10-22 HISTORY — PX: FLEXIBLE SIGMOIDOSCOPY: SHX5431

## 2022-10-22 SURGERY — SIGMOIDOSCOPY, FLEXIBLE
Anesthesia: General

## 2022-10-22 MED ORDER — LACTATED RINGERS IV SOLN: INTRAVENOUS | Status: DC

## 2022-10-22 MED ORDER — PROPOFOL 10 MG/ML IV BOLUS
INTRAVENOUS | Status: DC | PRN
  Administered 2022-10-22: 80 mg via INTRAVENOUS

## 2022-10-22 MED ORDER — PROPOFOL 500 MG/50ML IV EMUL
INTRAVENOUS | Status: DC | PRN
  Administered 2022-10-22: 200 ug/kg/min via INTRAVENOUS

## 2022-10-22 MED ORDER — LIDOCAINE HCL (CARDIAC) PF 100 MG/5ML IV SOSY
PREFILLED_SYRINGE | INTRAVENOUS | Status: DC | PRN
  Administered 2022-10-22: 50 mg via INTRATRACHEAL

## 2022-10-22 NOTE — Discharge Instructions (Signed)
You are being discharged to home.  Resume your previous diet.  Your physician has recommended a repeat colonoscopy  in six months for surveillance.

## 2022-10-22 NOTE — Anesthesia Preprocedure Evaluation (Addendum)
Anesthesia Evaluation  Patient identified by MRN, date of birth, ID band Patient awake    Reviewed: Allergy & Precautions, NPO status , Patient's Chart, lab work & pertinent test results  Airway Mallampati: II  TM Distance: >3 FB Neck ROM: Full    Dental  (+) Missing, Dental Advisory Given   Pulmonary former smoker   Pulmonary exam normal breath sounds clear to auscultation       Cardiovascular Exercise Tolerance: Good hypertension, Pt. on medications Normal cardiovascular exam Rhythm:Regular Rate:Normal     Neuro/Psych  PSYCHIATRIC DISORDERS Anxiety Depression     Neuromuscular disease    GI/Hepatic Neg liver ROS,GERD  ,,Colon cancer/resection    Endo/Other  negative endocrine ROS    Renal/GU negative Renal ROS  negative genitourinary   Musculoskeletal negative musculoskeletal ROS (+)    Abdominal   Peds negative pediatric ROS (+)  Hematology  (+) Blood dyscrasia, anemia   Anesthesia Other Findings   Reproductive/Obstetrics negative OB ROS                              Anesthesia Physical Anesthesia Plan  ASA: 2  Anesthesia Plan: General   Post-op Pain Management: Minimal or no pain anticipated   Induction: Intravenous  PONV Risk Score and Plan: Propofol infusion  Airway Management Planned: Nasal Cannula and Natural Airway  Additional Equipment:   Intra-op Plan:   Post-operative Plan:   Informed Consent: I have reviewed the patients History and Physical, chart, labs and discussed the procedure including the risks, benefits and alternatives for the proposed anesthesia with the patient or authorized representative who has indicated his/her understanding and acceptance.     Dental advisory given  Plan Discussed with: CRNA and Surgeon  Anesthesia Plan Comments:          Anesthesia Quick Evaluation

## 2022-10-22 NOTE — Op Note (Signed)
[facility] Patient Name: Alexandra Bright Procedure Date: 10/22/2022 9:39 AM MRN: [id number] Date of Birth: 1966/03/21 Attending MD: [name] , , [id number] CSN: [id number] Age: 56 Admit Type: Outpatient Procedure:                Flexible Sigmoidoscopy Indications:              High risk colon cancer surveillance: Personal                            history of rectal cancer Providers:                [name], Bode Page, [place] Risa Grill, Technician Referring MD:              Medicines:                Monitored Anesthesia Care Complications:            No immediate complications. Estimated Blood Loss:     Estimated blood loss: none. Procedure:                Pre-Anesthesia Assessment:                           - Prior to the procedure, a History and Physical                            was performed, and patient medications, allergies                            and sensitivities were reviewed. The patient's                            tolerance of previous anesthesia was reviewed.                           - The risks and benefits of the procedure and the                            sedation options and risks were discussed with the                            patient. All questions were answered and informed                            consent was obtained.                           - ASA Grade Assessment: III - A patient with severe                            systemic disease.                           After obtaining informed consent, the scope was  passed under direct vision. The PCF-HQ190L                            ([id number]) scope was introduced through the anus and                            advanced to the the sigmoid colon. The flexible                            sigmoidoscopy was accomplished without difficulty.                            The patient tolerated the procedure well. Scope In: 9:55:53  AM Scope Out: 9:59:56 AM Total Procedure Duration: 0 hours 4 minutes 3 seconds  Findings:      The perianal and digital rectal examinations were normal.      There was evidence of a prior end-to-side colo-colonic anastomosis at 6       cm proximal to the anus. This was patent and was characterized by       healthy appearing mucosa. The anastomosis was traversed.      The entire examined colon appeared normal.      Non-bleeding internal hemorrhoids were found during retroflexion. The       hemorrhoids were small. Impression:               - Patent end-to-side colo-colonic anastomosis,                            characterized by healthy appearing mucosa.                           - Non-bleeding internal hemorrhoids.                           - No specimens collected. Moderate Sedation:      Per Anesthesia Care Recommendation:           - Discharge patient to home (ambulatory).                           - Resume previous diet.                           - Repeat colonoscopy in 6 months for surveillance. Procedure Code(s):        --- Professional ---                           N6295, Colorectal cancer screening; flexible                            sigmoidoscopy Diagnosis Code(s):        --- Professional ---                           M84.132, Personal history of other malignant                            neoplasm  of rectum, rectosigmoid junction, and anus                           Z98.0, Intestinal bypass and anastomosis status                           K64.8, Other hemorrhoids CPT copyright 2022 [employer]. All rights reserved. The codes documented in this report are preliminary and upon coder review may  be revised to meet current compliance requirements. [name], MD [name],  10/22/2022 10:15:24 AM This report has been signed electronically. Number of Addenda: 0

## 2022-10-22 NOTE — Transfer of Care (Signed)
Immediate Anesthesia Transfer of Care Note  Patient: Walker Kehr  Procedure(s) Performed: FLEXIBLE SIGMOIDOSCOPY  Patient Location: Endoscopy Unit  Anesthesia Type:General  Level of Consciousness: awake, alert , oriented, and patient cooperative  Airway & Oxygen Therapy: Patient Spontanous Breathing and Patient connected to nasal cannula oxygen  Post-op Assessment: Report given to RN, Post -op Vital signs reviewed and stable, and Patient moving all extremities  Post vital signs: Reviewed and stable  Last Vitals:  Vitals Value Taken Time  BP    Temp    Pulse    Resp    SpO2      Last Pain:  Vitals:   10/22/22 0950  TempSrc:   PainSc: 0-No pain      Patients Stated Pain Goal: 3 (47/65/46 5035)  Complications: No notable events documented.

## 2022-10-22 NOTE — H&P (Signed)
ELLICE Bright is an 56 y.o. female.   Chief Complaint: rectal cancer surveillance HPI: Alexandra Bright is a 56 y.o. female with PMH Rectal cancer Stage IIIB, anxiety, constipation, GERD, IBS-C, hypertension and prediabetes, who presents for follow up of rectal cancer.   Patient denies any complaints. The patient denies having any nausea, vomiting, fever, chills, hematochezia, melena, hematemesis, abdominal distention, abdominal pain, diarrhea, jaundice, pruritus or weight loss.   Past Medical History:  Diagnosis Date   Anxiety    ASCUS of cervix with negative high risk HPV 02/01/2022   02/01/22 repeat in 1 year per [facility] 5 year CIN3+risk is 0.78%   Colon cancer ([place]) 01/2021   Constipation    GERD (gastroesophageal reflux disease)    Hypertension    Port-A-Cath in place 08/17/2021   Pre-diabetes     Past Surgical History:  Procedure Laterality Date   BIOPSY  02/18/2021   Procedure: BIOPSY;  Surgeon: Harvel Quale, MD;  Location: [facility];  Service: Gastroenterology;;   BIOPSY  03/05/2022   Procedure: BIOPSY;  Surgeon: Harvel Quale, MD;  Location: [facility];  Service: Gastroenterology;;   COLONOSCOPY WITH PROPOFOL N/A 03/05/2022   Procedure: COLONOSCOPY WITH PROPOFOL;  Surgeon: Harvel Quale, MD;  Location: [facility];  Service: Gastroenterology;  Laterality: N/A;  1040 ASA 1   FACIAL FRACTURE SURGERY  1991   from [place]  02/18/2021   Procedure: FLEXIBLE SIGMOIDOSCOPY;  Surgeon: Harvel Quale, MD;  Location: [facility];  Service: Gastroenterology;;   IR IMAGING GUIDED PORT INSERTION  08/13/2021   POLYPECTOMY  02/18/2021   Procedure: POLYPECTOMY INTESTINAL;  Surgeon: Harvel Quale, MD;  Location: [facility];  Service: Gastroenterology;;   SUBMUCOSAL TATTOO INJECTION  02/18/2021   Procedure: SUBMUCOSAL TATTOO INJECTION;  Surgeon: Harvel Quale, MD;  Location: [facility];   Service: Gastroenterology;;   SUBMUCOSAL TATTOO INJECTION  03/05/2022   Procedure: SUBMUCOSAL TATTOO INJECTION;  Surgeon: Harvel Quale, MD;  Location: [facility];  Service: Gastroenterology;;   XI ROBOTIC ASSISTED LOWER ANTERIOR RESECTION N/A 07/10/2021   Procedure: XI ROBOTIC ASSISTED LOWER ANTERIOR RESECTION;  Surgeon: Leighton Ruff, MD;  Location: [facility];  Service: General;  Laterality: N/A;    Family History  Problem Relation Age of Onset   Diabetes Paternal Grandfather    Diabetes Paternal Grandmother    Cancer Maternal Grandmother    Emphysema Maternal Grandfather    Diabetes Father    Diabetes Sister    Cancer Maternal Aunt    Ovarian cancer Paternal Aunt    Ovarian cancer Cousin    Prostate cancer Cousin    Social History:  reports that she quit smoking about 3 years ago. Her smoking use included cigarettes. She has a 20.00 pack-year smoking history. She has never used smokeless tobacco. She reports that she does not currently use alcohol. She reports that she does not use drugs.  Allergies: No Known Allergies  Medications Prior to Admission  Medication Sig Dispense Refill   acetaminophen (TYLENOL) 500 MG tablet Take 1,000 mg by mouth every 6 (six) hours as needed for mild pain, moderate pain or headache.     buPROPion (WELLBUTRIN SR) 200 MG 12 hr tablet Take 1 tablet by mouth twice daily 60 tablet 2   Docusate Calcium (STOOL SOFTENER PO) Take 1 tablet by mouth daily.     doxylamine, Sleep, (SLEEP AID) 25 MG tablet Take 50 mg by mouth at  bedtime.     losartan (COZAAR) 50 MG tablet Take 1 tablet by mouth once daily 90 tablet 0   Multiple Vitamins-Minerals (MULTIVITAMIN WITH MINERALS) tablet Take 1 tablet by mouth in the morning. Women's Multivitamin     polyethylene glycol-electrolytes (TRILYTE) 420 g solution Take 4,000 mLs by mouth as directed. 4000 mL 0   pregabalin (LYRICA) 50 MG capsule Take 1 capsule (50 mg total) by mouth 2 (two) times daily. 60 capsule 2    traMADol (ULTRAM) 50 MG tablet TAKE 1 TABLET(50 MG) BY MOUTH THREE TIMES DAILY AS NEEDED 90 tablet 0   vitamin B-12 (CYANOCOBALAMIN) 1000 MCG tablet Take 1,000 mcg by mouth daily.     sucralfate (CARAFATE) 1 g tablet Take 1 tablet (1 g total) by mouth 4 (four) times daily -  with meals and at bedtime. (Patient not taking: Reported on 10/20/2022) 21 tablet 0    No results found for this or any previous visit (from the past 48 hour(s)). No results found.  Review of Systems  All other systems reviewed and are negative.   Blood pressure 132/89, pulse 73, temperature 97.6 F (36.4 C), temperature source Oral, resp. rate 17, height '5\' 1"'$  (1.549 m), weight 67.1 kg, SpO2 99 %. Physical Exam  GENERAL: The patient is AO x3, in no acute distress. HEENT: Head is normocephalic and atraumatic. EOMI are intact. Mouth is well hydrated and without lesions. NECK: Supple. No masses LUNGS: Clear to auscultation. No presence of rhonchi/wheezing/rales. Adequate chest expansion HEART: RRR, normal s1 and s2. ABDOMEN: Soft, nontender, no guarding, no peritoneal signs, and nondistended. BS +. No masses. EXTREMITIES: Without any cyanosis, clubbing, rash, lesions or edema. NEUROLOGIC: AOx3, no focal motor deficit. SKIN: no jaundice, no rashes  Assessment/Plan Alexandra Bright is a 56 y.o. female with PMH Rectal cancer Stage IIIB, anxiety, constipation, GERD, IBS-C, hypertension and prediabetes, who presents for follow up of rectal cancer.  We will proceed with flexible sigmoidoscopy.  Harvel Quale, MD 10/22/2022, 8:00 AM

## 2022-10-22 NOTE — Anesthesia Postprocedure Evaluation (Signed)
Anesthesia Post Note  Patient: Alexandra Bright  Procedure(s) Performed: [place]  Patient location during evaluation: Phase II Anesthesia Type: General Level of consciousness: awake and alert and oriented Pain management: pain level controlled Vital Signs Assessment: post-procedure vital signs reviewed and stable Respiratory status: spontaneous breathing, nonlabored ventilation and respiratory function stable Cardiovascular status: blood pressure returned to baseline and stable Postop Assessment: no apparent nausea or vomiting Anesthetic complications: no  No notable events documented.   Last Vitals:  Vitals:   10/22/22 0754 10/22/22 1004  BP: 132/89 (!) 93/58  Pulse: 73 81  Resp: 17 14  Temp: 36.4 C 36.5 C  SpO2: 99% 98%    Last Pain:  Vitals:   10/22/22 1004  TempSrc: Axillary  PainSc: 0-No pain                 Chrys Landgrebe C [place] Skog

## 2022-10-29 ENCOUNTER — Encounter (HOSPITAL_COMMUNITY): Payer: Self-pay | Admitting: Gastroenterology

## 2022-11-05 ENCOUNTER — Ambulatory Visit
Admission: RE | Admit: 2022-11-05 | Discharge: 2022-11-05 | Disposition: A | Discharge status: Home / Self Care | Payer: 59 | Source: Ambulatory Visit | Attending: Hematology | Admitting: Hematology

## 2022-11-05 DIAGNOSIS — C20 Malignant neoplasm of rectum: Secondary | ICD-10-CM | POA: Diagnosis not present

## 2022-11-05 MED ORDER — IOPAMIDOL (ISOVUE-300) INJECTION 61%
100.0000 mL | Freq: Once | INTRAVENOUS | Status: AC | PRN
  Administered 2022-11-05: 100 mL via INTRAVENOUS

## 2022-11-10 ENCOUNTER — Encounter: Payer: Self-pay | Admitting: Hematology

## 2022-11-10 ENCOUNTER — Inpatient Hospital Stay: Payer: 59 | Attending: Hematology | Admitting: Hematology

## 2022-11-10 VITALS — BP 152/93 | HR 89 | Temp 98.2°F | Resp 18 | Wt 137.6 lb

## 2022-11-10 DIAGNOSIS — G629 Polyneuropathy, unspecified: Secondary | ICD-10-CM | POA: Insufficient documentation

## 2022-11-10 DIAGNOSIS — C20 Malignant neoplasm of rectum: Secondary | ICD-10-CM

## 2022-11-10 DIAGNOSIS — C187 Malignant neoplasm of sigmoid colon: Secondary | ICD-10-CM | POA: Diagnosis not present

## 2022-11-10 DIAGNOSIS — Z85038 Personal history of other malignant neoplasm of large intestine: Secondary | ICD-10-CM | POA: Insufficient documentation

## 2022-11-10 DIAGNOSIS — Z79899 Other long term (current) drug therapy: Secondary | ICD-10-CM | POA: Diagnosis not present

## 2022-11-10 NOTE — Progress Notes (Signed)
Alexandra Bright, Alexandra Bright [id number]   CLINIC:  Medical Oncology/Hematology  PCP:  Lindell Spar, MD [address] / [place] [address] [phone]   REASON FOR VISIT:  Follow-up for sigmoid adenocarcinoma  PRIOR THERAPY: FOLFOX for 6 months in the adjuvant setting  NGS Results: not done  CURRENT THERAPY: Surveillance  BRIEF ONCOLOGIC HISTORY:  Oncology History  Rectal cancer ([place])  04/08/2021 Initial Diagnosis   Rectal cancer ([place])   04/20/2021 - 04/20/2021 Chemotherapy         08/18/2021 - 12/09/2021 Chemotherapy   Patient is on Treatment Plan : COLORECTAL FOLFOX q14d x 4 months       CANCER STAGING:  Cancer Staging  Rectal cancer ([place]) Staging form: Colon and Rectum, AJCC 8th Edition - Clinical stage from 04/08/2021: Stage IIIB (cT3, cN1b, cM0) - Unsigned   INTERVAL HISTORY:  [name], a 56 y.o. female, seen for follow-up of colon cancer.  She reports that port site hurts on touch and when she is wearing seatbelt.  Denies any bleeding per rectum or melena.  Bowel habits are stable with constipation alternating with diarrhea.  Denies any bleeding per rectum or melena.  REVIEW OF SYSTEMS:  Review of Systems  Constitutional:  Negative for appetite change and fatigue.  Gastrointestinal:  Positive for constipation and diarrhea. Negative for abdominal pain.  Neurological:  Positive for headaches and numbness (fingers and toes).  Psychiatric/Behavioral:  Negative for sleep disturbance.   All other systems reviewed and are negative.   PAST MEDICAL/SURGICAL HISTORY:  Past Medical History:  Diagnosis Date   Anxiety    ASCUS of cervix with negative high risk HPV 02/01/2022   02/01/22 repeat in 1 year per [facility] 5 year CIN3+risk is 0.78%   Colon cancer ([place]) 01/2021   Constipation    GERD (gastroesophageal reflux disease)    Hypertension    Port-A-Cath in place 08/17/2021   Pre-diabetes    Past Surgical History:   Procedure Laterality Date   BIOPSY  02/18/2021   Procedure: BIOPSY;  Surgeon: Harvel Quale, MD;  [facility];  Service: Gastroenterology;;   BIOPSY  03/05/2022   Procedure: BIOPSY;  Surgeon: Harvel Quale, MD;  [facility];  Service: Gastroenterology;;   COLONOSCOPY WITH PROPOFOL N/A 03/05/2022   Procedure: COLONOSCOPY WITH PROPOFOL;  Surgeon: Harvel Quale, MD;  [facility];  Service: Gastroenterology;  Laterality: N/A;  1040 ASA 1   FACIAL FRACTURE SURGERY  1991   from [place]  02/18/2021   Procedure: FLEXIBLE SIGMOIDOSCOPY;  Surgeon: Montez Morita, Quillian Quince, MD;  [facility];  Service: Gastroenterology;;   [place] N/A 10/22/2022   Procedure: Beryle Quant;  Surgeon: Montez Morita, Quillian Quince, MD;  [facility];  Service: Gastroenterology;  Laterality: N/A;  1000 ASA 2   IR IMAGING GUIDED PORT INSERTION  08/13/2021   POLYPECTOMY  02/18/2021   Procedure: POLYPECTOMY INTESTINAL;  Surgeon: Harvel Quale, MD;  [facility];  Service: Gastroenterology;;   SUBMUCOSAL TATTOO INJECTION  02/18/2021   Procedure: SUBMUCOSAL TATTOO INJECTION;  Surgeon: Harvel Quale, MD;  [facility];  Service: Gastroenterology;;   SUBMUCOSAL TATTOO INJECTION  03/05/2022   Procedure: SUBMUCOSAL TATTOO INJECTION;  Surgeon: Harvel Quale, MD;  [facility];  Service: Gastroenterology;;   XI ROBOTIC ASSISTED LOWER ANTERIOR RESECTION N/A 07/10/2021   Procedure: XI ROBOTIC ASSISTED  LOWER ANTERIOR RESECTION;  Surgeon: Leighton Ruff, MD;  Location: [facility];  Service: General;  Laterality: N/A;    SOCIAL HISTORY:  Social History   Socioeconomic History   Marital status: Married    Spouse name: Not on file   Number of children: Not on file   Years of education: Not on file   Highest education level: Not on file   Occupational History   Not on file  Tobacco Use   Smoking status: Former    Packs/day: 0.50    Years: 40.00    Total pack years: 20.00    Types: Cigarettes    Quit date: 2020    Years since quitting: 3.9   Smokeless tobacco: Never  Vaping Use   Vaping Use: Never used  Substance and Sexual Activity   Alcohol use: Not Currently   Drug use: Never   Sexual activity: Yes    Birth control/protection: Post-menopausal  Other Topics Concern   Not on file  Social History Narrative   Not on file   Social Determinants of Health   Financial Resource Strain: Low Risk  (01/25/2022)   Overall Financial Resource Strain (CARDIA)    Difficulty of Paying Living Expenses: Not hard at all  Food Insecurity: No Food Insecurity (01/25/2022)   Hunger Vital Sign    Worried About Running Out of Food in the Last Year: Never true    Ran Out of Food in the Last Year: Never true  Transportation Needs: No Transportation Needs (01/25/2022)   PRAPARE - Hydrologist (Medical): No    Lack of Transportation (Non-Medical): No  Physical Activity: Sufficiently Active (01/25/2022)   Exercise Vital Sign    Days of Exercise per Week: 4 days    Minutes of Exercise per Session: 60 min  Stress: No Stress Concern Present (01/25/2022)   [place]    Feeling of Stress : Only a little  Social Connections: Moderately Integrated (01/25/2022)   Social Connection and Isolation Panel [NHANES]    Frequency of Communication with Friends and Family: More than three times a week    Frequency of Social Gatherings with Friends and Family: Three times a week    Attends Religious Services: More than 4 times per year    Active Member of Clubs or Organizations: No    Attends Archivist Meetings: Never    Marital Status: Married  Human resources officer Violence: Not At Risk (01/25/2022)   Humiliation, Afraid, Rape, and Kick questionnaire     Fear of Current or Ex-Partner: No    Emotionally Abused: No    Physically Abused: No    Sexually Abused: No    FAMILY HISTORY:  Family History  Problem Relation Age of Onset   Diabetes Paternal Grandfather    Diabetes Paternal Grandmother    Cancer Maternal Grandmother    Emphysema Maternal Grandfather    Diabetes Father    Diabetes Sister    Cancer Maternal Aunt    Ovarian cancer Paternal Aunt    Ovarian cancer Cousin    Prostate cancer Cousin     CURRENT MEDICATIONS:  Current Outpatient Medications  Medication Sig Dispense Refill   acetaminophen (TYLENOL) 500 MG tablet Take 1,000 mg by mouth every 6 (six) hours as needed for mild pain, moderate pain or headache.     buPROPion (WELLBUTRIN SR) 200 MG 12 hr tablet Take 1 tablet by mouth twice daily 60 tablet 2  Docusate Calcium (STOOL SOFTENER PO) Take 1 tablet by mouth daily.     doxylamine, Sleep, (SLEEP AID) 25 MG tablet Take 50 mg by mouth at bedtime.     losartan (COZAAR) 50 MG tablet Take 1 tablet by mouth once daily 90 tablet 0   Multiple Vitamins-Minerals (MULTIVITAMIN WITH MINERALS) tablet Take 1 tablet by mouth in the morning. Women's Multivitamin     pregabalin (LYRICA) 50 MG capsule Take 1 capsule (50 mg total) by mouth 2 (two) times daily. 60 capsule 2   sucralfate (CARAFATE) 1 g tablet Take 1 tablet (1 g total) by mouth 4 (four) times daily -  with meals and at bedtime. 21 tablet 0   traMADol (ULTRAM) 50 MG tablet TAKE 1 TABLET(50 MG) BY MOUTH THREE TIMES DAILY AS NEEDED 90 tablet 0   vitamin B-12 (CYANOCOBALAMIN) 1000 MCG tablet Take 1,000 mcg by mouth daily.     No current facility-administered medications for this visit.    ALLERGIES:  No Known Allergies  PHYSICAL EXAM:  Performance status (ECOG): 1 - Symptomatic but completely ambulatory  Vitals:   11/10/22 1400  BP: (!) 152/93  Pulse: 89  Resp: 18  Temp: 98.2 F (36.8 C)  SpO2: 100%    Wt Readings from Last 3 Encounters:  11/10/22 137 lb 9.1  oz (62.4 kg)  10/22/22 148 lb (67.1 kg)  08/18/22 140 lb 12.8 oz (63.9 kg)   Physical Exam Vitals reviewed.  Constitutional:      Appearance: Normal appearance.  Cardiovascular:     Rate and Rhythm: Normal rate and regular rhythm.     Pulses: Normal pulses.     Heart sounds: Normal heart sounds.  Pulmonary:     Effort: Pulmonary effort is normal.     Breath sounds: Normal breath sounds.  Abdominal:     Palpations: Abdomen is soft. There is no hepatomegaly, splenomegaly or mass.     Tenderness: There is no abdominal tenderness.  Lymphadenopathy:     Lower Body: No right inguinal adenopathy. No left inguinal adenopathy.  Neurological:     General: No focal deficit present.     Mental Status: She is alert and oriented to person, place, and time.  Psychiatric:        Mood and Affect: Mood normal.        Behavior: Behavior normal.      LABORATORY DATA:  I have reviewed the labs as listed.     Latest Ref Rng & Units 10/12/2022   10:28 AM 07/13/2022    2:45 PM 04/13/2022   12:45 PM  CBC  WBC 4.0 - 10.5 K/uL 7.9  6.9  9.9   Hemoglobin 12.0 - 15.0 g/dL 12.8  11.7  12.4   Hematocrit 36.0 - 46.0 % 38.0  34.7  38.0   Platelets 150 - 400 K/uL 229  276  271       Latest Ref Rng & Units 10/12/2022   10:28 AM 07/13/2022    2:45 PM 04/13/2022   12:45 PM  CMP  Glucose 70 - 99 mg/dL 100  99  133   BUN 6 - 20 mg/dL _0 Creatinine 0.44 - 1.00 mg/dL 0.77  0.96  1.02   Sodium 135 - 145 mmol/L 138  136  141   Potassium 3.5 - 5.1 mmol/L 3.8  3.9  3.7   Chloride 98 - 111 mmol/L 108  104  109   CO2 22 - 32 mmol/L 25  25  25   Calcium 8.9 - 10.3 mg/dL 9.1  8.9  9.6   Total Protein 6.5 - 8.1 g/dL 7.4  7.3  7.5   Total Bilirubin 0.3 - 1.2 mg/dL 0.5  0.6  0.3   Alkaline Phos 38 - 126 U/L 76  72  91   AST 15 - 41 U/L _0 ALT 0 - 44 U/L _1 DIAGNOSTIC IMAGING:  I have independently reviewed the scans and discussed with the patient. CT Abdomen Pelvis W  Contrast  Result Date: 11/05/2022 CLINICAL DATA:  Follow-up rectal carcinoma. Surveillance. * Tracking Code: BO * EXAM: CT ABDOMEN AND PELVIS WITH CONTRAST TECHNIQUE: Multidetector CT imaging of the abdomen and pelvis was performed using the standard protocol following bolus administration of intravenous contrast. RADIATION DOSE REDUCTION: This exam was performed according to the departmental dose-optimization program which includes automated exposure control, adjustment of the mA and/or kV according to patient size and/or use of iterative reconstruction technique. CONTRAST:  138m ISOVUE-300 IOPAMIDOL (ISOVUE-300) INJECTION 61% COMPARISON:  04/06/2022 FINDINGS: Lower Chest: No acute findings. Hepatobiliary:  No hepatic masses identified. Pancreas:  No mass or inflammatory changes. Spleen: Within normal limits in size and appearance. Adrenals/Urinary Tract: No suspicious masses identified. No evidence of ureteral calculi or hydronephrosis. Stomach/Bowel: No evidence of obstruction, inflammatory process or abnormal fluid collections. Surgical clips and mild soft tissue density in the presacral region are again seen, consistent with post treatment changes. Vascular/Lymphatic: No pathologically enlarged lymph nodes. No acute vascular findings. Reproductive:  No mass or other significant abnormality. Other:  None. Musculoskeletal:  No suspicious bone lesions identified. IMPRESSION: Stable exam. No evidence of recurrent or metastatic carcinoma within the abdomen or pelvis. Electronically Signed   By: JMarlaine HindM.D.   On: 11/05/2022 15:04     ASSESSMENT:  1.  Rectosigmoid sigmoid adenocarcinoma: - Presentation with the bleeding per rectum for the last 5 to 6 months and constipation. - Colonoscopy on 02/18/2021 showed ulcerated partially obstructing mass found from 12-17 cm from the anal verge, circumferential measuring 5 cm in length. - Biopsy consistent with adenocarcinoma. - CT CAP on 03/06/2021 with  circumferential wall thickening, hyperenhancement and fat stranding about the rectosigmoid junction, mass measuring approximately 6 cm in length.  No evidence of lymphadenopathy or metastatic disease. - CEA on 02/19/2021 was 1.8. - MRI of the pelvis on 04/06/2021 shows high rectal T3c/T4AN1 malignancy. - Chemoradiation therapy with Xeloda from 04/22/2021 through 06/01/2021. - CT CAP on 06/29/2021 showed improvement in the rectosigmoid thickening at the site of malignancy.  No clear adenopathy.  No evidence of metastatic disease. - Robotic assisted LAR on 07/10/2021 with pathology showing 2 cm grade 2, margins negative, 1/18 lymph nodes positive, no tumor deposits, positive treatment effect, no perineural invasion, no lymphovascular invasion, [id number], MMR preserved - She will need completion colonoscopy after adjuvant therapy. - 8 cycles of FOLFOX completed on 12/07/2021.   2.  Social/family history: - She worked as a bSecretary/administratorand recently stopped working.  She quit smoking in October 2021, smoked 1 pack/day for 40 years. - Maternal aunt had lung cancer and was a non-smoker.  Paternal aunt had cancer, type not known to the patient.   PLAN:  1.  T3c/T4N1 rectosigmoid  adenocarcinoma: - She does not report any change in bowel habits or bleeding per rectum or melena. - Reviewed labs from 10/12/2022: LFTs are normal.  CBC was normal. -  CEA was 2.6. - CTAP (11/05/2022): No evidence of recurrence or metastatic disease. - Flex sigmoidoscopy on 10/22/2022 showed patent end-to-side colonic anastomosis with nonbleeding internal hemorrhoids. 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  Recommend behavioral interventions like physical exercise, brain training programs.  CNS stimulants like modafinil and methylphenidate may help improve cognitive function. - She wants to have port taken out as it is hurting her.  She will have it done by IR. - RTC 3 months with repeat CEA, and other labs.   2.   Peripheral neuropathy: - She took gabapentin which did not help.  Dr. Posey Pronto will switch her to Lyrica 50 mg twice daily. - She reports neuropathic pains at nighttime. - I have instructed her to increase his bedtime Lyrica to 100 mg and continue 50 mg in the mornings.   Orders placed this encounter:  Orders Placed This Encounter  Procedures   IR Removal Tun Access W/ Port W/O FL   CBC with Differential/Platelet   Comprehensive metabolic panel   CEA      Derek Jack, MD [place] [phone]

## 2022-11-10 NOTE — Patient Instructions (Addendum)
[facility]  Discharge Instructions  You were seen and examined today by Dr. Delton Coombes.  Dr. Delton Coombes discussed your most recent lab work and CT scan which revealed that everything looks good.  You can have your port removed. Dr. Delton Coombes recommends that the Lyrica you start taking two pills at night and one in the morning.  Follow-up as scheduled in 3 months with labs.    Thank you for choosing [facility] to provide your oncology and hematology care.   To afford each patient quality time with our provider, please arrive at least 15 minutes before your scheduled appointment time. You may need to reschedule your appointment if you arrive late (10 or more minutes). Arriving late affects you and other patients whose appointments are after yours.  Also, if you miss three or more appointments without notifying the office, you may be dismissed from the clinic at the provider's discretion.    Again, thank you for choosing [facility].  Our hope is that these requests will decrease the amount of time that you wait before being seen by our physicians.   If you have a lab appointment with the [facility] please come in thru the Main Entrance and check in at the main information desk.           _____________________________________________________________  Should you have questions after your visit to [facility], please contact our office at [phone] and follow the prompts.  Our office hours are 8:00 a.m. to 4:30 p.m. Monday - Thursday and 8:00 a.m. to 2:30 p.m. Friday.  Please note that voicemails left after 4:00 p.m. may not be returned until the following business day.  We are closed weekends and all major holidays.  You do have access to a nurse 24-7, just call the main number to the clinic [phone] and do not press any options, hold on the line and a nurse will answer the phone.    For prescription  refill requests, have your pharmacy contact our office and allow 72 hours.    Masks are optional in the cancer centers. If you would like for your care team to wear a mask while they are taking care of you, please let them know. You may have one support person who is at least 56 years old accompany you for your appointments.

## 2022-11-18 ENCOUNTER — Ambulatory Visit: Payer: 59 | Admitting: Internal Medicine

## 2022-11-18 ENCOUNTER — Encounter: Payer: Self-pay | Admitting: Internal Medicine

## 2022-11-18 VITALS — BP 148/90 | HR 100 | Ht 61.0 in | Wt 136.4 lb

## 2022-11-18 DIAGNOSIS — L309 Dermatitis, unspecified: Secondary | ICD-10-CM

## 2022-11-18 DIAGNOSIS — G609 Hereditary and idiopathic neuropathy, unspecified: Secondary | ICD-10-CM

## 2022-11-18 DIAGNOSIS — R69 Illness, unspecified: Secondary | ICD-10-CM | POA: Diagnosis not present

## 2022-11-18 DIAGNOSIS — F419 Anxiety disorder, unspecified: Secondary | ICD-10-CM | POA: Diagnosis not present

## 2022-11-18 DIAGNOSIS — I10 Essential (primary) hypertension: Secondary | ICD-10-CM

## 2022-11-18 DIAGNOSIS — M654 Radial styloid tenosynovitis [de Quervain]: Secondary | ICD-10-CM

## 2022-11-18 MED ORDER — TRAMADOL HCL 50 MG PO TABS: 50.0000 mg | ORAL_TABLET | Freq: Three times a day (TID) | ORAL | 0 refills | Status: DC | PRN

## 2022-11-18 MED ORDER — LOSARTAN POTASSIUM 100 MG PO TABS: 100.0000 mg | ORAL_TABLET | Freq: Every day | ORAL | 0 refills | Status: DC

## 2022-11-18 MED ORDER — PREGABALIN 50 MG PO CAPS: ORAL_CAPSULE | ORAL | 2 refills | Status: DC

## 2022-11-18 MED ORDER — BUSPIRONE HCL 7.5 MG PO TABS: 7.5000 mg | ORAL_TABLET | Freq: Two times a day (BID) | ORAL | 3 refills | Status: DC

## 2022-11-18 NOTE — Assessment & Plan Note (Signed)
Rash likely eczematous Kenalog cream for now Referred to Dr Nevada Crane for second opinion as there is concern for psoriasis

## 2022-11-18 NOTE — Assessment & Plan Note (Signed)
Pins-and-needles sensation in bilateral UE and LE Has tried gabapentin up to 600 mg TID Still persistent, but improved since switching to Lyrica 50 mg BID Increased dose of Lyrica to 50 mg every morning and 100 mg every afternoon Tramadol PRN, currently gets from Oncology - I agreed to refill when needed
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
## 2022-11-18 NOTE — Assessment & Plan Note (Signed)
Her right thumb pain likely de Quervain tenosynovitis Ice and wrist brace advised Referred to Hand surgeon - had steroid injection
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
## 2022-11-18 NOTE — Assessment & Plan Note (Signed)
BP Readings from Last 1 Encounters:  11/18/22 (!) 148/90   Uncontrolled with losartan 50 mg daily Increased dose of losartan to 100 mg daily Counseled for compliance with the medications Advised DASH diet and moderate exercise/walking, at least 150 mins/week

## 2022-11-18 NOTE — Patient Instructions (Signed)
Please start taking Losartan 100 mg instead of 50 mg.  Please take Lyrica 50 mg in AM and 100 mg in the evening.  Please start taking Buspirone as prescribed for anxiety.  Please continue taking other medications as prescribed.

## 2022-11-28 ENCOUNTER — Other Ambulatory Visit: Payer: Self-pay | Admitting: Internal Medicine

## 2022-11-28 DIAGNOSIS — F419 Anxiety disorder, unspecified: Secondary | ICD-10-CM

## 2022-12-03 IMAGING — MR MR PELVIS W/O CM
8 of 10 series · 35 of 48 positions shown · non-contrast
Comparison: CT of the chest, abdomen and pelvis of March 06, 2021.
COMPARISON: CT of the chest, abdomen and pelvis of March 06, 2021.
COMPARISON: CT of the chest, abdomen and pelvis of March 06, 2021.

Addendum:
CLINICAL DATA: New diagnosis of colon cancer.

EXAM:
MRI PELVIS WITHOUT CONTRAST
TECHNIQUE: Multiplanar multisequence MR imaging of the pelvis was performed. No
intravenous contrast was administered.

[Series 2: T2 · sagittal · 3.0mm · 1.19mm/px · 4 of 35 slices shown (1 of 4)]
[im 1/35]
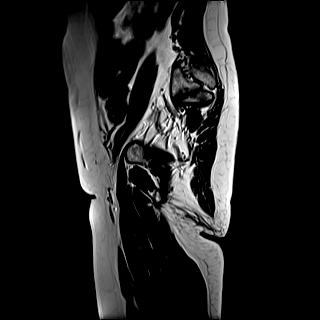
[im 12/35]
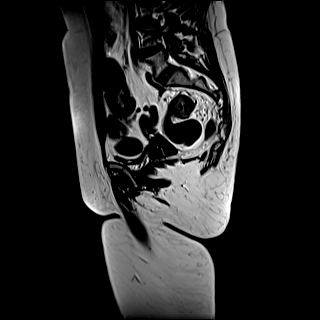
[im 23/35]
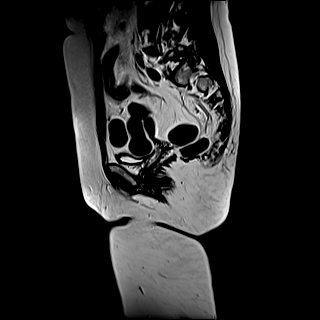
[im 35/35]
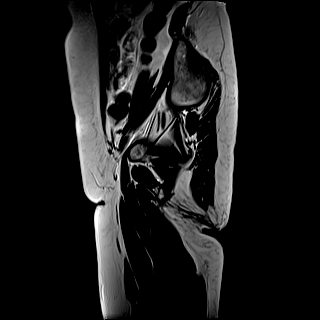

[Series 3: T2 · coronal · 3.0mm · 0.56mm/px · 6 of 45 slices shown (2 of 4)]
[im 1/45]
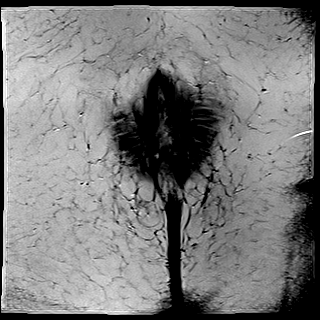
[im 9/45]
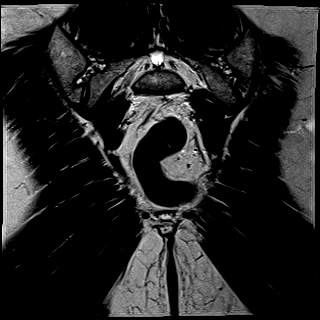
[im 18/45]
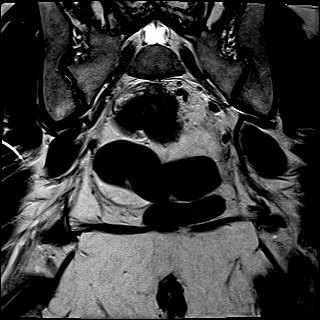
[im 27/45]
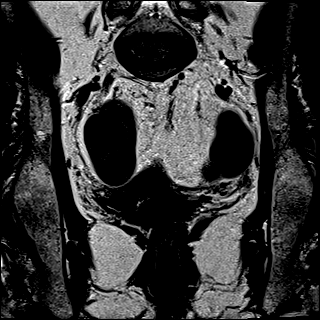
[im 36/45]
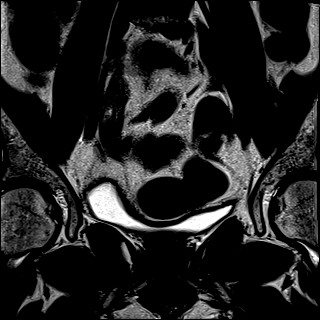
[im 45/45]
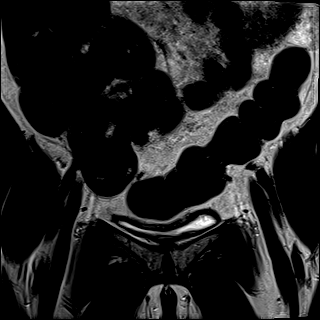

[Series 4: T2 · axial · 5.0mm · 1.19mm/px · z∈[-132,+84]mm · 5 of 37 slices shown (3 of 4)]
[im 1/37]
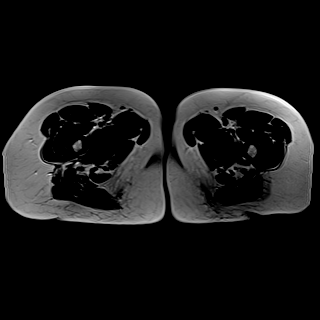
[im 10/37]
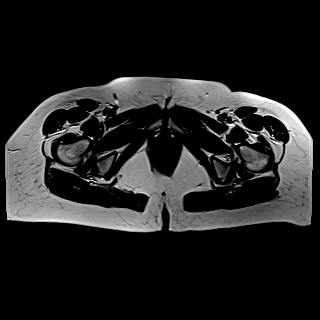
[im 19/37]
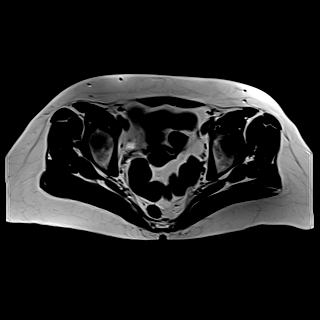
[im 28/37]
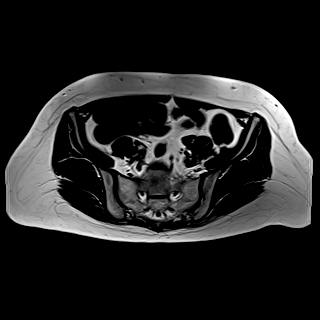
[im 37/37]
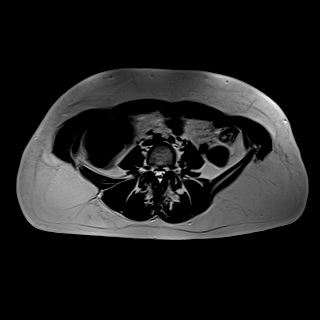

[Series 5: DWI · axial · 6.0mm · 1.42mm/px · z∈[-123,+85]mm · 4 of 30 slices shown (1 of 4)]
[im 1/30]
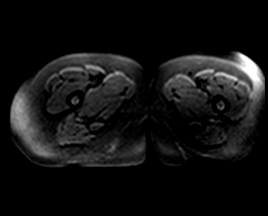
[im 10/30]
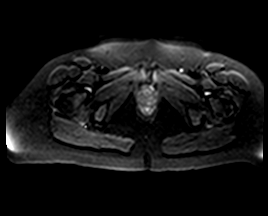
[im 20/30]
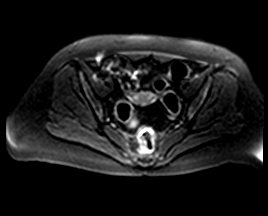
[im 30/30]
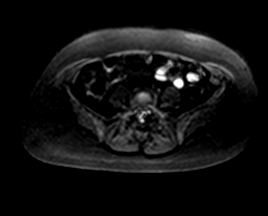

[Series 5: DWI · axial · 6.0mm · 1.42mm/px · z∈[-123,+85]mm · 4 of 30 slices shown (2 of 4)]
[im 1/30]
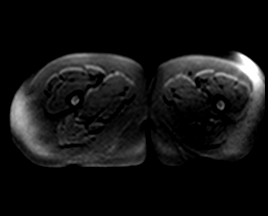
[im 10/30]
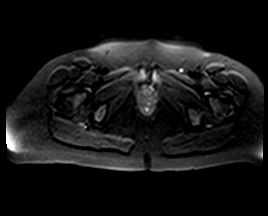
[im 20/30]
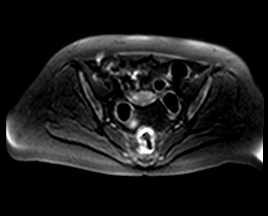
[im 30/30]
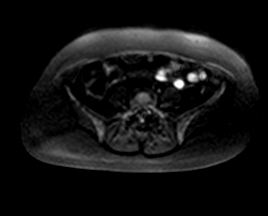

[Series 5: DWI · axial · 6.0mm · 1.42mm/px · z∈[-123,+85]mm · 4 of 30 slices shown (3 of 4)]
[im 1/30]
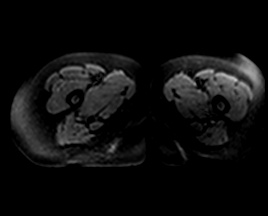
[im 10/30]
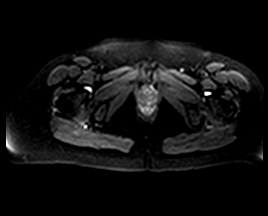
[im 20/30]
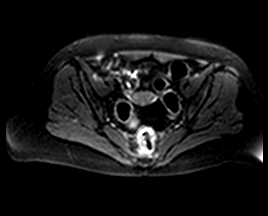
[im 30/30]
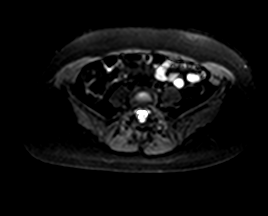

[Series 6: DWI · axial · 6.0mm · 1.42mm/px · z∈[-123,+85]mm · 4 of 30 slices shown (4 of 4)]
[im 1/30]
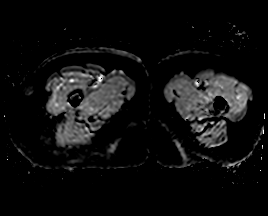
[im 10/30]
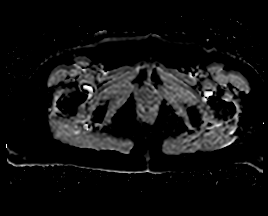
[im 20/30]
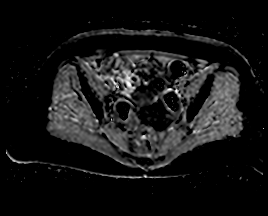
[im 30/30]
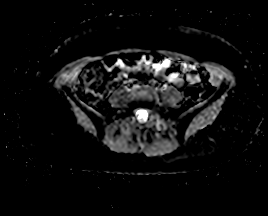

[Series 7: T2 · axial · 3.0mm · 0.56mm/px · z∈[-68,+10]mm · 4 of 45 slices shown (4 of 4)]
[im 1/45]
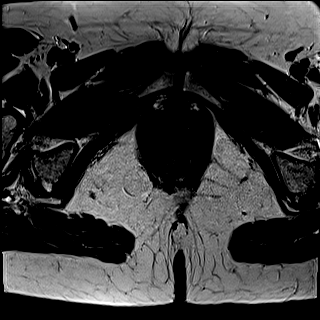
[im 9/45]
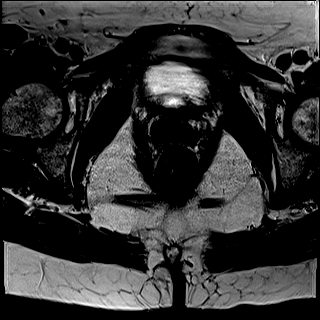
[im 18/45]
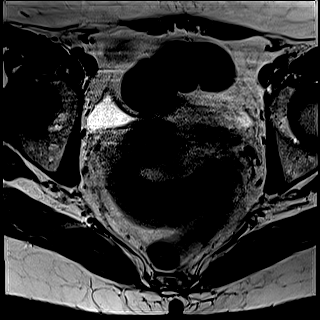
[im 27/45]
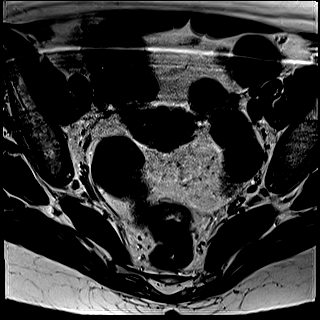

[35 of 48 positions shown; findings below may reference images not displayed]

FINDINGS: Urinary Tract: Urinary bladder without signs of contour abnormality.
No distal ureteral dilation.

Bowel:

Mass with circumferential involvement of the distal sigmoid
approximately 20-23 cm above the anal orifice/skin surface,
approximately 17-20 cm from the sphincter complex and located above
the anterior peritoneal reflection, perhaps just above shows
extensive infiltration of the pericolonic fat extending beyond the
wall of the sigmoid as it passes into the pelvis with infiltration
of the fat along the presacral space and fascial thickening best
seen on image 16 of series 9, anterior to the S1 and S2 level. Seen
more along the posterosuperior margin of the tumor but also
involving the anterior margin. Small adjacent lymph nodes are
suspicious for nodal involvement in the area along the course of the
superior rectal vein and sigmoid mesentery (image [DATE]) 5 mm with
abnormal signal. Abnormal signal extending beyond the colonic wall
also noted on image 24 of series 3

Smaller lymph nodes track towards the IMA. No signs of pelvic
sidewall lymphadenopathy.

Redundant sigmoid extends into the colon passing between the upper
rectum and the uterus but not between rectum and vagina.

Vascular/Lymphatic: See CT for further detail regarding vascular
structures. No signs of pelvic sidewall lymphadenopathy.

Reproductive: IUD in place. Normal appearance of the ovaries. No
mass in the uterus. Grossly normal appearance of cervix and vagina.

Other:  No ascites

Musculoskeletal: No suspicious bone lesions identified.
IMPRESSION: Tumor in the distal sigmoid colon just above the anterior peritoneal
reflection with extensive infiltration of pericolonic fat with
extension of tumor beyond the rectal wall and local lymph nodes with
abnormal morphologic features and abnormal signal.

Thickening of presacral fascia best seen on sagittal images and
bandlike areas extending from the colon may indicate fascial
involvement in extramural venous invasion.

ADDENDUM:
Upon additional review the tumor above appears more compatible with
a upper/high rectal rather than distal sigmoid neoplasm. The rectum
is quite redundant which does make assessment difficult.

Estimated distance from the internal sphincter is approximately
12-13 cm. Extension beyond the rectum approximately 6 mm. This
contacts the posterior mesorectum anterior to the sacrum on image 16
of series. The anterior peritoneal reflection is difficult to
resolve due to the redundant nature of the rectum. It may
potentially be present as a structure visualized on image 11 of
series 9, if so the tumor is located just below the APR and may
involve the anterior peritoneal reflection as it swings obliquely
across the rectum on image 27 of series 3.

Bandlike area of abnormal signal along the course of vascular
structures on image 16 of series 9.

Mesorectal lymph nodes and superior rectal lymph nodes as outlined
on the initial w report largest measuring 5 mm to 6 mm on image 23
of series 3, another similar size node seen on image 13 of series 7.

Assuming a pathologic diagnosis of rectal adenocarcinoma, staging by
imaging would be:

T stage-20 c, Potentially T4 a as the anterior peritoneal reflection
is difficult to assess on the current study due to redundant nature
of the rectum potential involvement is outlined above. Tumor does
contact the posterior mesorectum anterior to the sacrum along its
upper margin. Also with signs of extramural venous invasion
suspected

N stage-ZL

Distance from tumor to the internal anal sphincter is 12-13 cm.

Revised results were discussed with the provider, by telephone at
the time of interpretation on 04/08/2021 at [DATE] to provider
NESLY THIELEN , who verbally acknowledged the addendum which
was at that time forthcoming.

*** End of Addendum ***
Addendum:
FINDINGS: Urinary Tract: Urinary bladder without signs of contour abnormality.
No distal ureteral dilation.

Bowel:

Mass with circumferential involvement of the distal sigmoid
approximately 20-23 cm above the anal orifice/skin surface,
approximately 17-20 cm from the sphincter complex and located above
the anterior peritoneal reflection, perhaps just above shows
extensive infiltration of the pericolonic fat extending beyond the
wall of the sigmoid as it passes into the pelvis with infiltration
of the fat along the presacral space and fascial thickening best
seen on image 16 of series 9, anterior to the S1 and S2 level. Seen
more along the posterosuperior margin of the tumor but also
involving the anterior margin. Small adjacent lymph nodes are
suspicious for nodal involvement in the area along the course of the
superior rectal vein and sigmoid mesentery (image [DATE]) 5 mm with
abnormal signal. Abnormal signal extending beyond the colonic wall
also noted on image 24 of series 3

Smaller lymph nodes track towards the IMA. No signs of pelvic
sidewall lymphadenopathy.

Redundant sigmoid extends into the colon passing between the upper
rectum and the uterus but not between rectum and vagina.

Vascular/Lymphatic: See CT for further detail regarding vascular
structures. No signs of pelvic sidewall lymphadenopathy.

Reproductive: IUD in place. Normal appearance of the ovaries. No
mass in the uterus. Grossly normal appearance of cervix and vagina.

Other:  No ascites

Musculoskeletal: No suspicious bone lesions identified.
IMPRESSION: Tumor in the distal sigmoid colon just above the anterior peritoneal
reflection with extensive infiltration of pericolonic fat with
extension of tumor beyond the rectal wall and local lymph nodes with
abnormal morphologic features and abnormal signal.

Thickening of presacral fascia best seen on sagittal images and
bandlike areas extending from the colon may indicate fascial
involvement in extramural venous invasion.

ADDENDUM:
Upon additional review the tumor above appears more compatible with
a upper/high rectal rather than distal sigmoid neoplasm. The rectum
is quite redundant which does make assessment difficult.

Estimated distance from the internal sphincter is approximately
12-13 cm. Extension beyond the rectum approximately 6 mm. This
contacts the posterior mesorectum anterior to the sacrum on image 16
of series. The anterior peritoneal reflection is difficult to
resolve due to the redundant nature of the rectum. It may
potentially be present as a structure visualized on image 11 of
series 9, if so the tumor is located just below the APR and may
involve the anterior peritoneal reflection as it swings obliquely
across the rectum on image 27 of series 3.

Bandlike area of abnormal signal along the course of vascular
structures on image 16 of series 9.

Mesorectal lymph nodes and superior rectal lymph nodes as outlined
on the initial w report largest measuring 5 mm to 6 mm on image 23
of series 3, another similar size node seen on image 13 of series 7.

Assuming a pathologic diagnosis of rectal adenocarcinoma, staging by
imaging would be:

T stage-20 c, Potentially T4 a as the anterior peritoneal reflection
is difficult to assess on the current study due to redundant nature
of the rectum potential involvement is outlined above. Tumor does
contact the posterior mesorectum anterior to the sacrum along its
upper margin. Also with signs of extramural venous invasion
suspected

N stage-ZL

Distance from tumor to the internal anal sphincter is 12-13 cm.

Revised results were discussed with the provider, by telephone at
the time of interpretation on 04/08/2021 at [DATE] to provider
NESLY THIELEN , who verbally acknowledged the addendum which
was at that time forthcoming.

*** End of Addendum ***
FINDINGS: Urinary Tract: Urinary bladder without signs of contour abnormality.
No distal ureteral dilation.

Bowel:

Mass with circumferential involvement of the distal sigmoid
approximately 20-23 cm above the anal orifice/skin surface,
approximately 17-20 cm from the sphincter complex and located above
the anterior peritoneal reflection, perhaps just above shows
extensive infiltration of the pericolonic fat extending beyond the
wall of the sigmoid as it passes into the pelvis with infiltration
of the fat along the presacral space and fascial thickening best
seen on image 16 of series 9, anterior to the S1 and S2 level. Seen
more along the posterosuperior margin of the tumor but also
involving the anterior margin. Small adjacent lymph nodes are
suspicious for nodal involvement in the area along the course of the
superior rectal vein and sigmoid mesentery (image [DATE]) 5 mm with
abnormal signal. Abnormal signal extending beyond the colonic wall
also noted on image 24 of series 3

Smaller lymph nodes track towards the IMA. No signs of pelvic
sidewall lymphadenopathy.

Redundant sigmoid extends into the colon passing between the upper
rectum and the uterus but not between rectum and vagina.

Vascular/Lymphatic: See CT for further detail regarding vascular
structures. No signs of pelvic sidewall lymphadenopathy.

Reproductive: IUD in place. Normal appearance of the ovaries. No
mass in the uterus. Grossly normal appearance of cervix and vagina.

Other:  No ascites

Musculoskeletal: No suspicious bone lesions identified.
IMPRESSION: Tumor in the distal sigmoid colon just above the anterior peritoneal
reflection with extensive infiltration of pericolonic fat with
extension of tumor beyond the rectal wall and local lymph nodes with
abnormal morphologic features and abnormal signal.

Thickening of presacral fascia best seen on sagittal images and
bandlike areas extending from the colon may indicate fascial
involvement in extramural venous invasion.

## 2022-12-10 ENCOUNTER — Other Ambulatory Visit: Payer: Self-pay | Admitting: Internal Medicine

## 2022-12-10 DIAGNOSIS — F419 Anxiety disorder, unspecified: Secondary | ICD-10-CM

## 2022-12-14 ENCOUNTER — Encounter (INDEPENDENT_AMBULATORY_CARE_PROVIDER_SITE_OTHER): Payer: Self-pay | Admitting: Gastroenterology

## 2022-12-21 ENCOUNTER — Telehealth: Payer: Self-pay | Admitting: Internal Medicine

## 2022-12-21 ENCOUNTER — Other Ambulatory Visit: Payer: Self-pay | Admitting: Internal Medicine

## 2022-12-21 DIAGNOSIS — G609 Hereditary and idiopathic neuropathy, unspecified: Secondary | ICD-10-CM

## 2022-12-21 MED ORDER — PREGABALIN 50 MG PO CAPS: ORAL_CAPSULE | ORAL | 2 refills | Status: DC

## 2022-12-21 MED ORDER — TRAMADOL HCL 50 MG PO TABS: 50.0000 mg | ORAL_TABLET | Freq: Three times a day (TID) | ORAL | 0 refills | Status: DC | PRN

## 2022-12-21 NOTE — Telephone Encounter (Signed)
Patient called change pharmacy to [facility]  pregabalin [facility]) 50 MG capsule [[id number]]   traMADol (ULTRAM) 50 MG tablet [[id number]]   Phamacy: [facility]

## 2022-12-30 ENCOUNTER — Ambulatory Visit (INDEPENDENT_AMBULATORY_CARE_PROVIDER_SITE_OTHER): Payer: 59 | Admitting: Internal Medicine

## 2022-12-30 ENCOUNTER — Encounter: Payer: Self-pay | Admitting: Internal Medicine

## 2022-12-30 VITALS — BP 132/80 | HR 93 | Ht 61.0 in | Wt 136.0 lb

## 2022-12-30 DIAGNOSIS — C20 Malignant neoplasm of rectum: Secondary | ICD-10-CM

## 2022-12-30 DIAGNOSIS — I10 Essential (primary) hypertension: Secondary | ICD-10-CM

## 2022-12-30 DIAGNOSIS — F419 Anxiety disorder, unspecified: Secondary | ICD-10-CM | POA: Diagnosis not present

## 2022-12-30 DIAGNOSIS — G609 Hereditary and idiopathic neuropathy, unspecified: Secondary | ICD-10-CM | POA: Diagnosis not present

## 2022-12-30 MED ORDER — BUSPIRONE HCL 10 MG PO TABS: 10.0000 mg | ORAL_TABLET | Freq: Two times a day (BID) | ORAL | 1 refills | Status: DC

## 2022-12-30 MED ORDER — PREGABALIN 100 MG PO CAPS: 100.0000 mg | ORAL_CAPSULE | Freq: Two times a day (BID) | ORAL | 3 refills | Status: DC

## 2022-12-30 NOTE — Assessment & Plan Note (Addendum)
Uncontrolled However domestic situation is contributing to worsening of anxiety. On Wellbutrin 200 mg twice daily for now Added BuSpar, increased dose to 10 mg twice daily Did not tolerate SSRI in the past [facility] therapy, she agrees now.

## 2022-12-30 NOTE — Assessment & Plan Note (Signed)
S/p radiation and LAR surgery Followed by oncology-  s/p chemotherapy, has Port-A-Cath in place Gets colonoscopy for surveillance by GI

## 2022-12-30 NOTE — Patient Instructions (Addendum)
Please start taking Lyrica 100 mg twice daily.  Please start taking Buspirone 10 mg twice daily.  Please continue taking other medications as prescribed.  Please continue to follow low salt diet and ambulate as tolerated.

## 2022-12-30 NOTE — Assessment & Plan Note (Addendum)
BP Readings from Last 1 Encounters:  12/30/22 132/80   Well-controlled with losartan 100 mg daily now Counseled for compliance with the medications Advised DASH diet and moderate exercise/walking, at least 150 mins/week
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
## 2022-12-30 NOTE — Assessment & Plan Note (Addendum)
Pins-and-needles sensation in bilateral UE and LE Has tried gabapentin up to 600 mg TID Still persistent, but improved since switching to Lyrica Increased dose of Lyrica to 100 mg BID Tramadol PRN for severe pain

## 2023-01-09 ENCOUNTER — Other Ambulatory Visit: Payer: Self-pay | Admitting: Internal Medicine

## 2023-01-09 DIAGNOSIS — I10 Essential (primary) hypertension: Secondary | ICD-10-CM

## 2023-01-10 ENCOUNTER — Other Ambulatory Visit: Payer: Self-pay | Admitting: Internal Medicine

## 2023-01-10 DIAGNOSIS — G609 Hereditary and idiopathic neuropathy, unspecified: Secondary | ICD-10-CM

## 2023-02-09 ENCOUNTER — Inpatient Hospital Stay: Payer: 59 | Attending: Hematology

## 2023-02-09 DIAGNOSIS — I10 Essential (primary) hypertension: Secondary | ICD-10-CM | POA: Insufficient documentation

## 2023-02-09 DIAGNOSIS — Z85038 Personal history of other malignant neoplasm of large intestine: Secondary | ICD-10-CM | POA: Insufficient documentation

## 2023-02-09 DIAGNOSIS — C20 Malignant neoplasm of rectum: Secondary | ICD-10-CM

## 2023-02-09 DIAGNOSIS — G629 Polyneuropathy, unspecified: Secondary | ICD-10-CM | POA: Insufficient documentation

## 2023-02-09 DIAGNOSIS — C187 Malignant neoplasm of sigmoid colon: Secondary | ICD-10-CM

## 2023-02-09 DIAGNOSIS — Z79899 Other long term (current) drug therapy: Secondary | ICD-10-CM | POA: Diagnosis not present

## 2023-02-09 DIAGNOSIS — Z8042 Family history of malignant neoplasm of prostate: Secondary | ICD-10-CM | POA: Diagnosis not present

## 2023-02-09 DIAGNOSIS — Z8041 Family history of malignant neoplasm of ovary: Secondary | ICD-10-CM | POA: Insufficient documentation

## 2023-02-09 DIAGNOSIS — Z87891 Personal history of nicotine dependence: Secondary | ICD-10-CM | POA: Diagnosis not present

## 2023-02-09 LAB — COMPREHENSIVE METABOLIC PANEL
ALT: 24 U/L (ref 0–44)
AST: 28 U/L (ref 15–41)
Albumin: 4.4 g/dL (ref 3.5–5.0)
Alkaline Phosphatase: 67 U/L (ref 38–126)
Anion gap: 8 (ref 5–15)
BUN: 19 mg/dL (ref 6–20)
CO2: 22 mmol/L (ref 22–32)
Calcium: 9.2 mg/dL (ref 8.9–10.3)
Chloride: 104 mmol/L (ref 98–111)
Creatinine, Ser: 0.95 mg/dL (ref 0.44–1.00)
GFR, Estimated: >60 mL/min (ref >60)
Glucose, Bld: 144 mg/dL — ABNORMAL HIGH (ref 70–99)
Potassium: 3.9 mmol/L (ref 3.5–5.1)
Sodium: 134 mmol/L — ABNORMAL LOW (ref 135–145)
Total Bilirubin: 0.5 mg/dL (ref 0.3–1.2)
Total Protein: 7.9 g/dL (ref 6.5–8.1)

## 2023-02-09 LAB — CBC WITH DIFFERENTIAL/PLATELET
Abs Immature Granulocytes: 0.02 10*3/uL (ref 0.00–0.07)
Basophils Absolute: 0 10*3/uL (ref 0.0–0.1)
Basophils Relative: 1 %
Eosinophils Absolute: 0.1 10*3/uL (ref 0.0–0.5)
Eosinophils Relative: 1 %
HCT: 38.6 % (ref 36.0–46.0)
Hemoglobin: 12.9 g/dL (ref 12.0–15.0)
Immature Granulocytes: 0 %
Lymphocytes Relative: 25 %
Lymphs Abs: 1.8 10*3/uL (ref 0.7–4.0)
MCH: 31.7 pg (ref 26.0–34.0)
MCHC: 33.4 g/dL (ref 30.0–36.0)
MCV: 94.8 fL (ref 80.0–100.0)
Monocytes Absolute: 0.5 10*3/uL (ref 0.1–1.0)
Monocytes Relative: 7 %
Neutro Abs: 5 10*3/uL (ref 1.7–7.7)
Neutrophils Relative %: 66 %
Platelets: 239 10*3/uL (ref 150–400)
RBC: 4.07 MIL/uL (ref 3.87–5.11)
RDW: 12.9 % (ref 11.5–15.5)
WBC: 7.5 10*3/uL (ref 4.0–10.5)
nRBC: 0 % (ref 0.0–0.2)

## 2023-02-11 LAB — CEA: CEA: 2.6 ng/mL (ref 0.0–4.7)

## 2023-02-14 ENCOUNTER — Ambulatory Visit (INDEPENDENT_AMBULATORY_CARE_PROVIDER_SITE_OTHER): Payer: 59 | Admitting: Gastroenterology

## 2023-02-16 ENCOUNTER — Inpatient Hospital Stay (HOSPITAL_BASED_OUTPATIENT_CLINIC_OR_DEPARTMENT_OTHER): Payer: 59 | Admitting: Hematology

## 2023-02-16 VITALS — BP 118/80 | HR 72 | Temp 97.8°F | Resp 18 | Ht 61.0 in | Wt 134.4 lb

## 2023-02-16 DIAGNOSIS — Z85038 Personal history of other malignant neoplasm of large intestine: Secondary | ICD-10-CM | POA: Diagnosis not present

## 2023-02-16 DIAGNOSIS — C20 Malignant neoplasm of rectum: Secondary | ICD-10-CM | POA: Diagnosis not present

## 2023-02-16 DIAGNOSIS — C187 Malignant neoplasm of sigmoid colon: Secondary | ICD-10-CM

## 2023-02-16 MED ORDER — AMITRIPTYLINE HCL 25 MG PO TABS: 25.0000 mg | ORAL_TABLET | Freq: Every day | ORAL | 3 refills | Status: DC

## 2023-02-16 NOTE — Progress Notes (Signed)
[facility] [address] [place] [address]    Clinic Day:  02/16/2023  Referring physician: Lindell Spar, MD  Patient Care Team: Lindell Spar, MD as PCP - General (Internal Medicine) Brien Mates, RN as Oncology Nurse Navigator (Oncology) Derek Jack, MD as Medical Oncologist (Oncology)   ASSESSMENT & PLAN:   Assessment: 1.  Rectosigmoid sigmoid adenocarcinoma: - Presentation with the bleeding per rectum for the last 5 to 6 months and constipation. - Colonoscopy on 02/18/2021 showed ulcerated partially obstructing mass found from 12-17 cm from the anal verge, circumferential measuring 5 cm in length. - Biopsy consistent with adenocarcinoma. - CT CAP on 03/06/2021 with circumferential wall thickening, hyperenhancement and fat stranding about the rectosigmoid junction, mass measuring approximately 6 cm in length.  No evidence of lymphadenopathy or metastatic disease. - CEA on 02/19/2021 was 1.8. - MRI of the pelvis on 04/06/2021 shows high rectal T3c/T4AN1 malignancy. - Chemoradiation therapy with Xeloda from 04/22/2021 through 06/01/2021. - CT CAP on 06/29/2021 showed improvement in the rectosigmoid thickening at the site of malignancy.  No clear adenopathy.  No evidence of metastatic disease. - Robotic assisted LAR on 07/10/2021 with pathology showing 2 cm grade 2, margins negative, 1/18 lymph nodes positive, no tumor deposits, positive treatment effect, no perineural invasion, no lymphovascular invasion, [id number], MMR preserved - She will need completion colonoscopy after adjuvant therapy. - 8 cycles of FOLFOX completed on 12/07/2021.   2.  Social/family history: - She worked as a Secretary/administrator and recently stopped working.  She quit smoking in October 2021, smoked 1 pack/day for 40 years. - Maternal aunt had lung cancer and was a non-smoker.  Paternal aunt had cancer, type not known to the patient.  Plan: 1.  T3c/T4N1 rectosigmoid   adenocarcinoma: - Last CTAP on 11/05/2022 with no evidence of recurrence. - Flex sigmoidoscopy on 10/22/2022: Patent end-to-end colonic anastomosis with [facility]. - Labs: LFTs are normal.  CBC normal.  CEA is 2.6. - She reports that her port is bothering when she wears seatbelt.  She wants to have it removed.  We will ask IR to remove port. - RTC 3 months for follow-up with repeat labs.  She will also need CT AP with contrast.   2.  Peripheral neuropathy: - She is taking Lyrica 100 mg in the morning and 200 mg at bedtime. - Reports both hands and arms burning sensation at nighttime which is new. - Will add amitriptyline 25 mg at bedtime.  Orders Placed This Encounter  Procedures   IR Removal Tun Access W/ Port W/O FL    Standing Status:   Future    Standing Expiration Date:   02/16/2024    Order Specific Question:   Reason for Exam (SYMPTOM  OR DIAGNOSIS REQUIRED)    Answer:   chemo completion    Order Specific Question:   Is the patient pregnant?    Answer:   No    Order Specific Question:   Preferred Imaging Location?    Answer:   [facility]    Order Specific Question:   Release to patient    Answer:   Immediate   CBC with Differential/Platelet    Standing Status:   Future    Standing Expiration Date:   02/16/2024    Order Specific Question:   Release to patient    Answer:   Immediate   Comprehensive metabolic panel    Standing Status:   Future  Standing Expiration Date:   02/16/2024    Order Specific Question:   Release to patient    Answer:   Immediate   CEA    Standing Status:   Future    Standing Expiration Date:   02/16/2024   Magnesium    Standing Status:   Future    Standing Expiration Date:   02/16/2024    Order Specific Question:   Release to patient    Answer:   Immediate    I,Alexis Herring,acting as a scribe for Derek Jack, MD.,have documented all relevant documentation on the behalf of Derek Jack, MD,as directed by  Derek Jack,  MD while in the presence of Derek Jack, MD.   I, Derek Jack MD, have reviewed the above documentation for accuracy and completeness, and I agree with the above.   Derek Jack, MD   3/27/20246:31 PM  CHIEF COMPLAINT:   Diagnosis: sigmoid adenocarcinoma    Cancer Staging  Rectal cancer [facility]) Staging form: Colon and Rectum, AJCC 8th Edition - Clinical stage from 04/08/2021: Stage IIIB (cT3, cN1b, cM0) - Unsigned    Prior Therapy: FOLFOX for 6 months in the adjuvant setting   Current Therapy:  Surveillance   HISTORY OF PRESENT ILLNESS:   Oncology History  Rectal cancer ([place])  04/08/2021 Initial Diagnosis   Rectal cancer ([place])   04/20/2021 - 04/20/2021 Chemotherapy         08/18/2021 - 12/09/2021 Chemotherapy   Patient is on Treatment Plan : COLORECTAL FOLFOX q14d x 4 months        INTERVAL HISTORY:   Alexandra Bright is a 57 y.o. female presenting to clinic today for follow up of sigmoid adenocarcinoma. She was last seen by me on 11/10/22.  Today, she states that she is doing well overall. However, she reports burning discomfort in her bilateral hands that occurs only at nighttime, often waking her from sleep. The pain radiates all the way up her arms to her shoulders. She has been taking Lyrica 200mg  at bedtime and 100mg  in the mornings without relief of her pain. She is also taking an OTC sleep-aid without relief. Her appetite level is at 100%. Her energy level is at 85%. She denies any abdominal pain. She reports that she still has tingling in her feet but this has not worsened. She denies any associated pain in her feet. She would like to have her port removed as her seatbelt irritates the area. Her port was placed by Dr. Leighton Ruff in [id number].  PAST MEDICAL HISTORY:   Past Medical History: Past Medical History:  Diagnosis Date   Anxiety    ASCUS of cervix with negative high risk HPV 02/01/2022   02/01/22 repeat in 1 year per [facility] 5 year CIN3+risk is  0.78%   Colon cancer ([place]) 01/2021   Constipation    GERD (gastroesophageal reflux disease)    Hypertension    Port-A-Cath in place 08/17/2021   Pre-diabetes     Surgical History: Past Surgical History:  Procedure Laterality Date   BIOPSY  02/18/2021   Procedure: BIOPSY;  Surgeon: Harvel Quale, MD;  [facility];  Service: Gastroenterology;;   BIOPSY  03/05/2022   Procedure: BIOPSY;  Surgeon: Harvel Quale, MD;  [facility];  Service: Gastroenterology;;   COLONOSCOPY WITH PROPOFOL N/A 03/05/2022   Procedure: COLONOSCOPY WITH PROPOFOL;  Surgeon: Harvel Quale, MD;  [facility];  Service: Gastroenterology;  Laterality: N/A;  1040 ASA 1   FACIAL FRACTURE  SURGERY  1991   from [place]  02/18/2021   Procedure: FLEXIBLE SIGMOIDOSCOPY;  Surgeon: [place] Morita, Quillian Quince, MD;  [facility];  Service: Gastroenterology;;   [place] N/A 10/22/2022   Procedure: Beryle Quant;  Surgeon: Montez Morita, Quillian Quince, MD;  [facility];  Service: Gastroenterology;  Laterality: N/A;  1000 ASA 2   IR IMAGING GUIDED PORT INSERTION  08/13/2021   POLYPECTOMY  02/18/2021   Procedure: POLYPECTOMY INTESTINAL;  Surgeon: Montez Morita, Quillian Quince, MD;  [facility];  Service: Gastroenterology;;   SUBMUCOSAL TATTOO INJECTION  02/18/2021   Procedure: SUBMUCOSAL TATTOO INJECTION;  Surgeon: Montez Morita, Quillian Quince, MD;  [facility];  Service: Gastroenterology;;   SUBMUCOSAL TATTOO INJECTION  03/05/2022   Procedure: SUBMUCOSAL TATTOO INJECTION;  Surgeon: Montez Morita, Quillian Quince, MD;  [facility];  Service: Gastroenterology;;   XI ROBOTIC ASSISTED LOWER ANTERIOR RESECTION N/A 07/10/2021   Procedure: XI ROBOTIC ASSISTED LOWER ANTERIOR RESECTION;  Surgeon: Leighton Ruff, MD;  Location: [facility];  Service: General;  Laterality: N/A;    Social  History: Social History   Socioeconomic History   Marital status: Married    Spouse name: Not on file   Number of children: Not on file   Years of education: Not on file   Highest education level: Not on file  Occupational History   Not on file  Tobacco Use   Smoking status: Former    Packs/day: 0.50    Years: 40.00    Additional pack years: 0.00    Total pack years: 20.00    Types: Cigarettes    Quit date: 2020    Years since quitting: 4.2   Smokeless tobacco: Never  Vaping Use   Vaping Use: Never used  Substance and Sexual Activity   Alcohol use: Not Currently   Drug use: Never   Sexual activity: Yes    Birth control/protection: Post-menopausal  Other Topics Concern   Not on file  Social History Narrative   Not on file   Social Determinants of Health   Financial Resource Strain: Low Risk  (01/25/2022)   Overall Financial Resource Strain (CARDIA)    Difficulty of Paying Living Expenses: Not hard at all  Food Insecurity: No Food Insecurity (01/25/2022)   Hunger Vital Sign    Worried About Running Out of Food in the Last Year: Never true    Ran Out of Food in the Last Year: Never true  Transportation Needs: No Transportation Needs (01/25/2022)   PRAPARE - Hydrologist (Medical): No    Lack of Transportation (Non-Medical): No  Physical Activity: Sufficiently Active (01/25/2022)   Exercise Vital Sign    Days of Exercise per Week: 4 days    Minutes of Exercise per Session: 60 min  Stress: No Stress Concern Present (01/25/2022)   [place]    Feeling of Stress : Only a little  Social Connections: Moderately Integrated (01/25/2022)   Social Connection and Isolation Panel [NHANES]    Frequency of Communication with Friends and Family: More than three times a week    Frequency of Social Gatherings with Friends and Family: Three times a week    Attends Religious Services: More than 4  times per year    Active Member of Clubs or Organizations: No    Attends Archivist Meetings: Never    Marital Status: Married  Human resources officer Violence: Not  At Risk (01/25/2022)   Humiliation, Afraid, Rape, and Kick questionnaire    Fear of Current or Ex-Partner: No    Emotionally Abused: No    Physically Abused: No    Sexually Abused: No    Family History: Family History  Problem Relation Age of Onset   Diabetes Paternal Grandfather    Diabetes Paternal Grandmother    Cancer Maternal Grandmother    Emphysema Maternal Grandfather    Diabetes Father    Diabetes Sister    Cancer Maternal Aunt    Ovarian cancer Paternal Aunt    Ovarian cancer Cousin    Prostate cancer Cousin     Current Medications:  Current Outpatient Medications:    acetaminophen (TYLENOL) 500 MG tablet, Take 1,000 mg by mouth every 6 (six) hours as needed for mild pain, moderate pain or headache., Disp: , Rfl:    amitriptyline (ELAVIL) 25 MG tablet, Take 1 tablet (25 mg total) by mouth at bedtime., Disp: 30 tablet, Rfl: 3   buPROPion (WELLBUTRIN SR) 200 MG 12 hr tablet, Take 1 tablet by mouth twice daily, Disp: 60 tablet, Rfl: 5   busPIRone (BUSPAR) 10 MG tablet, Take 1 tablet (10 mg total) by mouth 2 (two) times daily., Disp: 180 tablet, Rfl: 1   Docusate Calcium (STOOL SOFTENER PO), Take 1 tablet by mouth daily., Disp: , Rfl:    doxylamine, Sleep, (SLEEP AID) 25 MG tablet, Take 50 mg by mouth at bedtime., Disp: , Rfl:    losartan (COZAAR) 100 MG tablet, Take 1 tablet (100 mg total) by mouth daily., Disp: 90 tablet, Rfl: 0   losartan (COZAAR) 50 MG tablet, Take 1 tablet by mouth once daily, Disp: 90 tablet, Rfl: 0   Multiple Vitamins-Minerals (MULTIVITAMIN WITH MINERALS) tablet, Take 1 tablet by mouth in the morning. Women's Multivitamin, Disp: , Rfl:    pregabalin (LYRICA) 100 MG capsule, Take 1 capsule (100 mg total) by mouth 2 (two) times daily., Disp: 60 capsule, Rfl: 3   sucralfate (CARAFATE) 1  g tablet, Take 1 tablet (1 g total) by mouth 4 (four) times daily -  with meals and at bedtime., Disp: 21 tablet, Rfl: 0   traMADol (ULTRAM) 50 MG tablet, TAKE 1 TABLET BY MOUTH THREE TIMES DAILY AS NEEDED FOR MODERATE/SEVERE PAIN, Disp: 90 tablet, Rfl: 2   vitamin B-12 (CYANOCOBALAMIN) 1000 MCG tablet, Take 1,000 mcg by mouth daily., Disp: , Rfl:    Allergies: No Known Allergies  REVIEW OF SYSTEMS:   Review of Systems  Constitutional:  Negative for chills, fatigue and fever.  HENT:   Negative for lump/mass, mouth sores, nosebleeds, sore throat and trouble swallowing.   Eyes:  Negative for eye problems.  Respiratory:  Negative for cough and shortness of breath.   Cardiovascular:  Negative for chest pain, leg swelling and palpitations.  Gastrointestinal:  Negative for abdominal pain, constipation, diarrhea, nausea and vomiting.  Genitourinary:  Negative for bladder incontinence, difficulty urinating, dysuria, frequency, hematuria and nocturia.   Musculoskeletal:  Positive for myalgias (hands- burning pain of 6/10 severity). Negative for arthralgias, back pain, flank pain and neck pain.  Skin:  Negative for itching and rash.  Neurological:  Negative for dizziness, headaches and numbness.       + tingling in feet  Hematological:  Does not bruise/bleed easily.  Psychiatric/Behavioral:  Negative for depression, sleep disturbance and suicidal ideas. The patient is not nervous/anxious.   All other systems reviewed and are negative.    VITALS:   Blood pressure  118/80, pulse 72, temperature 97.8 F (36.6 C), temperature source Oral, resp. rate 18, height 5\' 1"  (1.549 m), weight 134 lb 6.4 oz (61 kg), SpO2 100 %.  Wt Readings from Last 3 Encounters:  02/16/23 134 lb 6.4 oz (61 kg)  12/30/22 136 lb (61.7 kg)  11/18/22 136 lb 6.4 oz (61.9 kg)    Body mass index is 25.39 kg/m.  Performance status (ECOG): 1 - Symptomatic but completely ambulatory  PHYSICAL EXAM:   Physical Exam Vitals  and nursing note reviewed. Exam conducted with a chaperone present.  Constitutional:      Appearance: Normal appearance.  Cardiovascular:     Rate and Rhythm: Normal rate and regular rhythm.     Pulses: Normal pulses.     Heart sounds: Normal heart sounds.  Pulmonary:     Effort: Pulmonary effort is normal.     Breath sounds: Normal breath sounds.  Abdominal:     Palpations: Abdomen is soft. There is no hepatomegaly, splenomegaly or mass.     Tenderness: There is no abdominal tenderness.  Musculoskeletal:     Right lower leg: No edema.     Left lower leg: No edema.  Lymphadenopathy:     Cervical: No cervical adenopathy.     Right cervical: No superficial, deep or posterior cervical adenopathy.    Left cervical: No superficial, deep or posterior cervical adenopathy.     Upper Body:     Right upper body: No supraclavicular or axillary adenopathy.     Left upper body: No supraclavicular or axillary adenopathy.  Neurological:     General: No focal deficit present.     Mental Status: She is alert and oriented to person, place, and time.  Psychiatric:        Mood and Affect: Mood normal.        Behavior: Behavior normal.     LABS:      Latest Ref Rng & Units 02/09/2023    2:11 PM 10/12/2022   10:28 AM 07/13/2022    2:45 PM  CBC  WBC 4.0 - 10.5 K/uL 7.5  7.9  6.9   Hemoglobin 12.0 - 15.0 g/dL 12.9  12.8  11.7   Hematocrit 36.0 - 46.0 % 38.6  38.0  34.7   Platelets 150 - 400 K/uL 239  229  276       Latest Ref Rng & Units 02/09/2023    2:11 PM 10/12/2022   10:28 AM 07/13/2022    2:45 PM  CMP  Glucose 70 - 99 mg/dL 144  100  99   BUN 6 - 20 mg/dL 19  10  14    Creatinine 0.44 - 1.00 mg/dL 0.95  0.77  0.96   Sodium 135 - 145 mmol/L 134  138  136   Potassium 3.5 - 5.1 mmol/L 3.9  3.8  3.9   Chloride 98 - 111 mmol/L 104  108  104   CO2 22 - 32 mmol/L 22  25  25    Calcium 8.9 - 10.3 mg/dL 9.2  9.1  8.9   Total Protein 6.5 - 8.1 g/dL 7.9  7.4  7.3   Total Bilirubin 0.3 - 1.2  mg/dL 0.5  0.5  0.6   Alkaline Phos 38 - 126 U/L 67  76  72   AST 15 - 41 U/L 28  27  19    ALT 0 - 44 U/L 24  24  19       Lab Results  Component Value Date  CEA1 2.6 02/09/2023   CEA 1.8 02/19/2021   /  CEA  Date Value Ref Range Status  02/09/2023 2.6 0.0 - 4.7 ng/mL Final    Comment:    (NOTE)                             Nonsmokers          <3.9                             Smokers             <5.6 Roche [employer]ostics Electrochemiluminescence Immunoassay (ECLIA) Values obtained with different assay methods or kits cannot be used interchangeably.  Results cannot be interpreted as absolute evidence of the presence or absence of malignant disease. Performed At: [facility] [place], [place] [id number] Rush Farmer MD [id number]   02/19/2021 1.8 ng/mL Final    Comment:    Non-Smoker: <2.5 Smoker:     <5.0 . . This test was performed using the Siemens  chemiluminescent method. Values obtained from different assay methods cannot be used interchangeably. CEA levels, regardless of value, should not be interpreted as absolute evidence of the presence or absence of disease. .    No results found for: "PSA1" No results found for: "[id number]" No results found for: "CAN125"  No results found for: "TOTALPROTELP", "ALBUMINELP", "A1GS", "A2GS", "BETS", "BETA2SER", "GAMS", "MSPIKE", "SPEI" No results found for: "TIBC", "FERRITIN", "IRONPCTSAT" No results found for: "LDH"   STUDIES:   No results found.

## 2023-02-16 NOTE — Patient Instructions (Addendum)
[facility]  Discharge Instructions  You were seen and examined today by Dr. Delton Coombes.  Dr. Delton Coombes discussed your most recent lab work which revealed everything looks good.  Start taking Amitriptyline 25 mg once daily at bedtime. If it is too much take half a pill.  Follow-up as scheduled in 3 months with labs.    Thank you for choosing [facility] to provide your oncology and hematology care.   To afford each patient quality time with our provider, please arrive at least 15 minutes before your scheduled appointment time. You may need to reschedule your appointment if you arrive late (10 or more minutes). Arriving late affects you and other patients whose appointments are after yours.  Also, if you miss three or more appointments without notifying the office, you may be dismissed from the clinic at the provider's discretion.    Again, thank you for choosing [facility].  Our hope is that these requests will decrease the amount of time that you wait before being seen by our physicians.   If you have a lab appointment with the [facility] please come in thru the Main Entrance and check in at the main information desk.           _____________________________________________________________  Should you have questions after your visit to [facility], please contact our office at [phone] and follow the prompts.  Our office hours are 8:00 a.m. to 4:30 p.m. Monday - Thursday and 8:00 a.m. to 2:30 p.m. Friday.  Please note that voicemails left after 4:00 p.m. may not be returned until the following business day.  We are closed weekends and all major holidays.  You do have access to a nurse 24-7, just call the main number to the clinic [phone] and do not press any options, hold on the line and a nurse will answer the phone.    For prescription refill requests, have your pharmacy contact our office and  allow 72 hours.    Masks are optional in the cancer centers. If you would like for your care team to wear a mask while they are taking care of you, please let them know. You may have one support person who is at least 57 years old accompany you for your appointments.

## 2023-02-17 ENCOUNTER — Other Ambulatory Visit: Payer: Self-pay

## 2023-02-17 ENCOUNTER — Ambulatory Visit (INDEPENDENT_AMBULATORY_CARE_PROVIDER_SITE_OTHER): Payer: 59 | Admitting: Gastroenterology

## 2023-02-17 ENCOUNTER — Encounter (INDEPENDENT_AMBULATORY_CARE_PROVIDER_SITE_OTHER): Payer: Self-pay | Admitting: Gastroenterology

## 2023-02-17 VITALS — BP 125/81 | HR 71 | Ht 61.0 in | Wt 136.2 lb

## 2023-02-17 DIAGNOSIS — C19 Malignant neoplasm of rectosigmoid junction: Secondary | ICD-10-CM

## 2023-02-17 DIAGNOSIS — K581 Irritable bowel syndrome with constipation: Secondary | ICD-10-CM

## 2023-02-17 DIAGNOSIS — C187 Malignant neoplasm of sigmoid colon: Secondary | ICD-10-CM

## 2023-02-17 NOTE — Patient Instructions (Signed)
Increase water intake, aim for atleast 64 oz per day Increase fruits, veggies and whole grains, kiwi and prunes are especially good for constipation You can continue stool softener if this provides good results for you  We will plan to repeat Colonoscopy in June 2024  Follow up 1 year

## 2023-02-17 NOTE — Progress Notes (Addendum)
Referring Provider: Lindell Spar, MD Primary Care Physician:  Lindell Spar, MD Primary GI Physician: Jenetta Downer   Chief Complaint  Patient presents with   Irritable Bowel Syndrome    Follow up on IBS. Doing well. No symptoms.    Rectal Cancer    Follow up on rectal cancer. Had flexible sigmoidoscopy in December.    HPI:   Alexandra Bright is a 57 y.o. female with past medical history of Rectal cancer was Stage IIIB, anxiety, constipation, GERD, IBS-C, hypertension and prediabetes,   Patient presenting today for follow up of IBS/rectal cancer.   History: started on chemotherapy regimen with FOLFOX by Dr. Delton Coombes, which she finished on 12/07/2021 although (8 cycles).  She received chemoradiation with Xeloda from 04/22/2021 through 06/01/2021 and underwent robotic assisted LAR on 07/10/2021 -2 more had 2 cm and had negative margins with 1/18 positive lymph nodes, no perineural invasion or lymphovascular invasion.  Rectal cancer was Stage IIIB (cT3, cN1b, cM0).   Last seen march 2023, at that time, new onset diarrhea, husband also had diarrhea, 6-8 Bms per day that resolved spontaneously. Taking duclolax PRN for constipation.   Recommended to schedule colonoscopy, miralax and follow with Dr. Raliegh Ip.  Colonoscopy and flex sig as outlined below, advised to repeat Colonoscopy in June 2024.   Present:  Patient reports taking a stool softener daily having a BM daily. Uses miralax PRN if she feels more constipated but this is rare. No abdominal discomfort. Can eat most anything without issue. No red flag symptoms. Patient denies melena, hematochezia, nausea, vomiting, diarrhea, constipation, dysphagia, odyonophagia, early satiety or weight loss.   Saw Dr. Raliegh Ip yesterday, has upcoming CT scan and port removal soon.   Last CT A/P with contrast in December without recurrent or metastatic carcinoma seen  Flex sig: 10/2022  - Patent end-to-side colo-colonic anastomosis,                             characterized by healthy appearing mucosa.                           - Non-bleeding internal hemorrhoids.                           - No specimens collected. Last Colonoscopy:02/2022 - Thickened fold in the transverse colon. Biopsied.                            Injected.                           - Patent end-to-end colo-rectal anastomosis,                            characterized by healthy appearing mucosa. Biopsied.                           - Non-bleeding internal hemorrhoids.    Recommendations:    Past Medical History:  Diagnosis Date   Anxiety    ASCUS of cervix with negative high risk HPV 02/01/2022   02/01/22 repeat in 1 year per [facility] 5 year CIN3+risk is 0.78%   Colon cancer ([place]) 01/2021   Constipation    GERD (gastroesophageal reflux  disease)    Hypertension    Port-A-Cath in place 08/17/2021   Pre-diabetes     Past Surgical History:  Procedure Laterality Date   BIOPSY  02/18/2021   Procedure: BIOPSY;  Surgeon: Harvel Quale, MD;  [facility];  Service: Gastroenterology;;   BIOPSY  03/05/2022   Procedure: BIOPSY;  Surgeon: Harvel Quale, MD;  [facility];  Service: Gastroenterology;;   COLONOSCOPY WITH PROPOFOL N/A 03/05/2022   Procedure: COLONOSCOPY WITH PROPOFOL;  Surgeon: Harvel Quale, MD;  [facility];  Service: Gastroenterology;  Laterality: N/A;  1040 ASA 1   FACIAL FRACTURE SURGERY  1991   from [place]  02/18/2021   Procedure: FLEXIBLE SIGMOIDOSCOPY;  Surgeon: Montez Morita, Quillian Quince, MD;  [facility];  Service: Gastroenterology;;   [place] N/A 10/22/2022   Procedure: Beryle Quant;  Surgeon: Montez Morita, Quillian Quince, MD;  [facility];  Service: Gastroenterology;  Laterality: N/A;  1000 ASA 2   IR IMAGING GUIDED PORT INSERTION  08/13/2021   POLYPECTOMY  02/18/2021   Procedure: POLYPECTOMY INTESTINAL;  Surgeon: Harvel Quale, MD;  [facility];  Service: Gastroenterology;;   SUBMUCOSAL TATTOO INJECTION  02/18/2021   Procedure: SUBMUCOSAL TATTOO INJECTION;  Surgeon: Harvel Quale, MD;  [facility];  Service: Gastroenterology;;   SUBMUCOSAL TATTOO INJECTION  03/05/2022   Procedure: SUBMUCOSAL TATTOO INJECTION;  Surgeon: Harvel Quale, MD;  [facility];  Service: Gastroenterology;;   XI ROBOTIC ASSISTED LOWER ANTERIOR RESECTION N/A 07/10/2021   Procedure: XI ROBOTIC ASSISTED LOWER ANTERIOR RESECTION;  Surgeon: Leighton Ruff, MD;  [facility]: [facility];  Service: General;  Laterality: N/A;    Current Outpatient Medications  Medication Sig Dispense Refill   acetaminophen (TYLENOL) 500 MG tablet Take 1,000 mg by mouth every 6 (six) hours as needed for mild pain, moderate pain or headache.     buPROPion (WELLBUTRIN SR) 200 MG 12 hr tablet Take 1 tablet by mouth twice daily 60 tablet 5   busPIRone (BUSPAR) 10 MG tablet Take 1 tablet (10 mg total) by mouth 2 (two) times daily. 180 tablet 1   Docusate Calcium (STOOL SOFTENER PO) Take 1 tablet by mouth daily.     doxylamine, Sleep, (SLEEP AID) 25 MG tablet Take 50 mg by mouth at bedtime.     losartan (COZAAR) 100 MG tablet Take 1 tablet (100 mg total) by mouth daily. 90 tablet 0   Multiple Vitamins-Minerals (MULTIVITAMIN WITH MINERALS) tablet Take 1 tablet by mouth in the morning. Women's Multivitamin     pregabalin (LYRICA) 100 MG capsule Take 1 capsule (100 mg total) by mouth 2 (two) times daily. 60 capsule 3   traMADol (ULTRAM) 50 MG tablet TAKE 1 TABLET BY MOUTH THREE TIMES DAILY AS NEEDED FOR MODERATE/SEVERE PAIN 90 tablet 2   vitamin B-12 (CYANOCOBALAMIN) 1000 MCG tablet Take 1,000 mcg by mouth daily.     amitriptyline (ELAVIL) 25 MG tablet Take 1 tablet (25 mg total) by mouth at bedtime. (Patient not taking: Reported on 02/17/2023) 30 tablet 3   No current facility-administered medications for this  visit.    Allergies as of 02/17/2023   (No Known Allergies)    Family History  Problem Relation Age of Onset   Diabetes Paternal Grandfather    Diabetes Paternal Grandmother    Cancer Maternal Grandmother    Emphysema Maternal Grandfather    Diabetes Father    Diabetes  Sister    Cancer Maternal Aunt    Ovarian cancer Paternal Aunt    Ovarian cancer Cousin    Prostate cancer Cousin     Social History   Socioeconomic History   Marital status: Married    Spouse name: Not on file   Number of children: Not on file   Years of education: Not on file   Highest education level: Not on file  Occupational History   Not on file  Tobacco Use   Smoking status: Former    Packs/day: 0.50    Years: 40.00    Additional pack years: 0.00    Total pack years: 20.00    Types: Cigarettes    Quit date: 2020    Years since quitting: 4.2    Passive exposure: Past   Smokeless tobacco: Never  Vaping Use   Vaping Use: Never used  Substance and Sexual Activity   Alcohol use: Not Currently   Drug use: Never   Sexual activity: Yes    Birth control/protection: Post-menopausal  Other Topics Concern   Not on file  Social History Narrative   Not on file   Social Determinants of Health   Financial Resource Strain: Low Risk  (01/25/2022)   Overall Financial Resource Strain (CARDIA)    Difficulty of Paying Living Expenses: Not hard at all  Food Insecurity: No Food Insecurity (01/25/2022)   Hunger Vital Sign    Worried About Running Out of Food in the Last Year: Never true    Ran Out of Food in the Last Year: Never true  Transportation Needs: No Transportation Needs (01/25/2022)   PRAPARE - Hydrologist (Medical): No    Lack of Transportation (Non-Medical): No  Physical Activity: Sufficiently Active (01/25/2022)   Exercise Vital Sign    Days of Exercise per Week: 4 days    Minutes of Exercise per Session: 60 min  Stress: No Stress Concern Present (01/25/2022)    [place]    Feeling of Stress : Only a little  Social Connections: Moderately Integrated (01/25/2022)   Social Connection and Isolation Panel [NHANES]    Frequency of Communication with Friends and Family: More than three times a week    Frequency of Social Gatherings with Friends and Family: Three times a week    Attends Religious Services: More than 4 times per year    Active Member of Clubs or Organizations: No    Attends Archivist Meetings: Never    Marital Status: Married    Review of systems General: negative for malaise, night sweats, fever, chills, weight loss Neck: Negative for lumps, goiter, pain and significant neck swelling Resp: Negative for cough, wheezing, dyspnea at rest CV: Negative for chest pain, leg swelling, palpitations, orthopnea GI: denies melena, hematochezia, nausea, vomiting, diarrhea, constipation, dysphagia, odyonophagia, early satiety or unintentional weight loss.  MSK: Negative for joint pain or swelling, back pain, and muscle pain. Derm: Negative for itching or rash Psych: Denies depression, anxiety, memory loss, confusion. No homicidal or suicidal ideation.  Heme: Negative for prolonged bleeding, bruising easily, and swollen nodes. Endocrine: Negative for cold or heat intolerance, polyuria, polydipsia and goiter. Neuro: negative for tremor, gait imbalance, syncope and seizures. The remainder of the review of systems is noncontributory.  Physical Exam: BP 125/81 (BP Location: Left Arm, Patient Position: Sitting, Cuff Size: Normal)   Pulse 71   Ht 5\' 1"  (1.549 m)   Wt 136  lb 3.2 oz (61.8 kg)   BMI 25.73 kg/m  General:   Alert and oriented. No distress noted. Pleasant and cooperative.  Head:  Normocephalic and atraumatic. Eyes:  Conjuctiva clear without scleral icterus. Mouth:  Oral mucosa pink and moist. Good dentition. No lesions. Heart: Normal rate and rhythm, s1 and s2  heart sounds present.  Lungs: Clear lung sounds in all lobes. Respirations equal and unlabored. Abdomen:  +BS, soft, non-tender and non-distended. No rebound or guarding. No HSM or masses noted. Derm: No palmar erythema or jaundice Msk:  Symmetrical without gross deformities. Normal posture. Extremities:  Without edema. Neurologic:  Alert and  oriented x4 Psych:  Alert and cooperative. Normal mood and affect.  Invalid input(s): "6 MONTHS"   ASSESSMENT: Alexandra Bright is a 57 y.o. female presenting today for follow up of IBS-C and rectal cancer  IBS-C:denies abdominal pain. Uses daily stool softener, occasional miralax but usually has BM once daily without issue. Encouraged to Increase water intake, aim for atleast 64 oz per day, Increase fruits, veggies and whole grains, kiwi and prunes are especially good for constipation. Can continue with daily stool softener with good results.  History of rectosigmoid adenocarcinoma: s/p completion of chemradiation. Last colonoscopy was April 2023 with flex sig in December 2023. CT A/P in December 2023 without recurrence of malignancy or mets. Recommended to have repeat Colonoscopy in June 2024. She has no rectal bleeding, abdominal pain, weight loss, changes in appetite. Indications, risks and benefits of procedure discussed in detail with patient. Patient verbalized understanding and is in agreement to proceed with Colonoscopy   PLAN:  Continue daily stool softener  2. Continue to follow with Dr. Delton Coombes 3. Colonoscopy in June 2024 ASA III 4. Increase water intake, aim for 64 oz/day 5. Increase fruits and veggies, kiwi and prune especially good   All questions were answered, patient verbalized understanding and is in agreement with plan as outlined above.   Follow Up: 1 year   Tae Vonada L. Alver Sorrow, MSN, APRN, AGNP-C Adult-Gerontology Nurse Practitioner [facility]  I have reviewed the note and agree with the APP's  assessment as described in this progress note  [name], MD Gastroenterology and Hepatology [facility] Gastroenterology

## 2023-02-17 NOTE — Progress Notes (Signed)
CT AbdPelvis order placed per Dr. Delton Coombes verbal order

## 2023-02-19 NOTE — Addendum Note (Signed)
Addended by: Harvel Quale on: 02/19/2023 10:34 PM   Modules accepted: Level of Service

## 2023-02-28 ENCOUNTER — Other Ambulatory Visit: Payer: Self-pay | Admitting: Internal Medicine

## 2023-02-28 DIAGNOSIS — C189 Malignant neoplasm of colon, unspecified: Secondary | ICD-10-CM

## 2023-03-01 ENCOUNTER — Encounter (HOSPITAL_COMMUNITY): Payer: Self-pay

## 2023-03-01 ENCOUNTER — Other Ambulatory Visit: Payer: Self-pay

## 2023-03-01 ENCOUNTER — Ambulatory Visit (HOSPITAL_COMMUNITY)
Admission: RE | Admit: 2023-03-01 | Discharge: 2023-03-01 | Disposition: A | Discharge status: Home / Self Care | Payer: 59 | Source: Ambulatory Visit | Attending: Hematology | Admitting: Hematology

## 2023-03-01 DIAGNOSIS — C187 Malignant neoplasm of sigmoid colon: Secondary | ICD-10-CM

## 2023-03-01 DIAGNOSIS — C20 Malignant neoplasm of rectum: Secondary | ICD-10-CM | POA: Insufficient documentation

## 2023-03-01 DIAGNOSIS — Z452 Encounter for adjustment and management of vascular access device: Secondary | ICD-10-CM | POA: Diagnosis present

## 2023-03-01 DIAGNOSIS — C189 Malignant neoplasm of colon, unspecified: Secondary | ICD-10-CM

## 2023-03-01 HISTORY — PX: IR REMOVAL TUN ACCESS W/ PORT W/O FL MOD SED: IMG2290

## 2023-03-01 MED ORDER — MIDAZOLAM HCL 2 MG/2ML IJ SOLN
INTRAMUSCULAR | Status: AC
  Filled 2023-03-01: qty 4

## 2023-03-01 MED ORDER — SODIUM CHLORIDE 0.9 % IV SOLN: INTRAVENOUS | Status: DC

## 2023-03-01 MED ORDER — MIDAZOLAM HCL 2 MG/2ML IJ SOLN
INTRAMUSCULAR | Status: AC | PRN
  Administered 2023-03-01 (×2): 1 mg via INTRAVENOUS

## 2023-03-01 MED ORDER — LIDOCAINE HCL (PF) 1 % IJ SOLN: 10.0000 mL | Freq: Once | INTRAMUSCULAR | Status: DC

## 2023-03-01 MED ORDER — FENTANYL CITRATE (PF) 100 MCG/2ML IJ SOLN
INTRAMUSCULAR | Status: AC | PRN
  Administered 2023-03-01: 50 ug via INTRAVENOUS

## 2023-03-01 MED ORDER — LIDOCAINE-EPINEPHRINE 1 %-1:100000 IJ SOLN
INTRAMUSCULAR | Status: AC
  Filled 2023-03-01: qty 1

## 2023-03-01 MED ORDER — FENTANYL CITRATE (PF) 100 MCG/2ML IJ SOLN
INTRAMUSCULAR | Status: AC
  Filled 2023-03-01: qty 2

## 2023-03-01 NOTE — H&P (Signed)
Chief Complaint: Patient was seen in consultation today for [facility] a cath removal at the request of Katragadda,Sreedhar  Referring Physician(s): Air cabin crew  Supervising Physician: Irish Lack  Patient Status: [name] - Out-pt  History of Present Illness: Alexandra Bright is a 57 y.o. female   Hx Rectal cancer PAC placed in IR 08/13/21 Last use of PAC was Jan 2023  Complains that [facility] is bothersome at this point "Gets caught on seat belt; grandson always trying to mess with it" Dr Ellin Saba requesting removal Scheduled for this today  Past Medical History:  Diagnosis Date   Anxiety    ASCUS of cervix with negative high risk HPV 02/01/2022   02/01/22 repeat in 1 year per [facility] 5 year CIN3+risk is 0.78%   Colon cancer 01/2021   Constipation    GERD (gastroesophageal reflux disease)    Hypertension    Port-A-Cath in place 08/17/2021   Pre-diabetes     Past Surgical History:  Procedure Laterality Date   BIOPSY  02/18/2021   Procedure: BIOPSY;  Surgeon: Dolores Frame, MD;  [facility];  Service: Gastroenterology;;   BIOPSY  03/05/2022   Procedure: BIOPSY;  Surgeon: Dolores Frame, MD;  [facility];  Service: Gastroenterology;;   COLONOSCOPY WITH PROPOFOL N/A 03/05/2022   Procedure: COLONOSCOPY WITH PROPOFOL;  Surgeon: Dolores Frame, MD;  [facility];  Service: Gastroenterology;  Laterality: N/A;  1040 ASA 1   FACIAL FRACTURE SURGERY  1991   from [place]   FLEXIBLE SIGMOIDOSCOPY  02/18/2021   Procedure: FLEXIBLE SIGMOIDOSCOPY;  Surgeon: Marguerita Merles, Reuel Boom, MD;  [facility];  Service: Gastroenterology;;   FLEXIBLE SIGMOIDOSCOPY N/A 10/22/2022   Procedure: Arnell Sieving;  Surgeon: Marguerita Merles, Reuel Boom, MD;  [facility];  Service: Gastroenterology;  Laterality: N/A;  1000 ASA 2   IR IMAGING GUIDED PORT INSERTION  08/13/2021   POLYPECTOMY  02/18/2021   Procedure:  POLYPECTOMY INTESTINAL;  Surgeon: Dolores Frame, MD;  [facility];  Service: Gastroenterology;;   SUBMUCOSAL TATTOO INJECTION  02/18/2021   Procedure: SUBMUCOSAL TATTOO INJECTION;  Surgeon: Dolores Frame, MD;  [facility];  Service: Gastroenterology;;   SUBMUCOSAL TATTOO INJECTION  03/05/2022   Procedure: SUBMUCOSAL TATTOO INJECTION;  Surgeon: Dolores Frame, MD;  [facility];  Service: Gastroenterology;;   XI ROBOTIC ASSISTED LOWER ANTERIOR RESECTION N/A 07/10/2021   Procedure: XI ROBOTIC ASSISTED LOWER ANTERIOR RESECTION;  Surgeon: Romie Levee, MD;  [facility]: [facility];  Service: General;  Laterality: N/A;    Allergies: Patient has no known allergies.  Medications: Prior to Admission medications   Medication Sig Start Date End Date Taking? Authorizing Provider  acetaminophen (TYLENOL) 500 MG tablet Take 1,000 mg by mouth every 6 (six) hours as needed for mild pain, moderate pain or headache.   Yes [provider]  amitriptyline (ELAVIL) 25 MG tablet Take 1 tablet (25 mg total) by mouth at bedtime. 02/16/23  Yes Doreatha Massed, MD  buPROPion [facility] SR) 200 MG 12 hr tablet Take 1 tablet by mouth twice daily 11/29/22  Yes Anabel Halon, MD  busPIRone (BUSPAR) 10 MG tablet Take 1 tablet (10 mg total) by mouth 2 (two) times daily. 12/30/22  Yes Anabel Halon, MD  Docusate Calcium (STOOL SOFTENER PO) Take 1 tablet by mouth daily.   Yes [provider]  doxylamine, Sleep, (SLEEP AID) 25 MG tablet Take 50 mg by mouth at bedtime.  Yes [provider]  losartan (COZAAR) 100 MG tablet Take 1 tablet (100 mg total) by mouth daily. 11/18/22  Yes Anabel HalonPatel, Rutwik K, MD  Multiple Vitamins-Minerals (MULTIVITAMIN WITH MINERALS) tablet Take 1 tablet by mouth in the morning. Women's Multivitamin   Yes [provider]  pregabalin (LYRICA) 100 MG capsule Take 1 capsule (100 mg total) by mouth 2 (two)  times daily. 12/30/22  Yes Anabel HalonPatel, Rutwik K, MD  traMADol (ULTRAM) 50 MG tablet TAKE 1 TABLET BY MOUTH THREE TIMES DAILY AS NEEDED FOR MODERATE/SEVERE PAIN 01/17/23  Yes Anabel HalonPatel, Rutwik K, MD  vitamin B-12 (CYANOCOBALAMIN) 1000 MCG tablet Take 1,000 mcg by mouth daily.   Yes [provider]     Family History  Problem Relation Age of Onset   Diabetes Paternal Grandfather    Diabetes Paternal Grandmother    Cancer Maternal Grandmother    Emphysema Maternal Grandfather    Diabetes Father    Diabetes Sister    Cancer Maternal Aunt    Ovarian cancer Paternal Aunt    Ovarian cancer Cousin    Prostate cancer Cousin     Social History   Socioeconomic History   Marital status: Married    Spouse name: Not on file   Number of children: Not on file   Years of education: Not on file   Highest education level: Not on file  Occupational History   Not on file  Tobacco Use   Smoking status: Former    Packs/day: 0.50    Years: 40.00    Additional pack years: 0.00    Total pack years: 20.00    Types: Cigarettes    Quit date: 2020    Years since quitting: 4.2    Passive exposure: Past   Smokeless tobacco: Never  Vaping Use   Vaping Use: Never used  Substance and Sexual Activity   Alcohol use: Not Currently   Drug use: Never   Sexual activity: Yes    Birth control/protection: Post-menopausal  Other Topics Concern   Not on file  Social History Narrative   Not on file   Social Determinants of Health   Financial Resource Strain: Low Risk  (01/25/2022)   Overall Financial Resource Strain (CARDIA)    Difficulty of Paying Living Expenses: Not hard at all  Food Insecurity: No Food Insecurity (01/25/2022)   Hunger Vital Sign    Worried About Running Out of Food in the Last Year: Never true    Ran Out of Food in the Last Year: Never true  Transportation Needs: No Transportation Needs (01/25/2022)   PRAPARE - Administrator, Civil ServiceTransportation    Lack of Transportation (Medical): No    Lack of  Transportation (Non-Medical): No  Physical Activity: Sufficiently Active (01/25/2022)   Exercise Vital Sign    Days of Exercise per Week: 4 days    Minutes of Exercise per Session: 60 min  Stress: No Stress Concern Present (01/25/2022)   [employer] - Occupational Stress Questionnaire    Feeling of Stress : Only a little  Social Connections: Moderately Integrated (01/25/2022)   Social Connection and Isolation Panel [NHANES]    Frequency of Communication with Friends and Family: More than three times a week    Frequency of Social Gatherings with Friends and Family: Three times a week    Attends Religious Services: More than 4 times per year    Active Member of Clubs or Organizations: No    Attends BankerClub or Organization Meetings: Never  Marital Status: Married    Review of Systems: A 12 point ROS discussed and pertinent positives are indicated in the HPI above.  All other systems are negative.  Review of Systems  Constitutional:  Negative for activity change, fatigue and fever.  Respiratory:  Negative for cough and shortness of breath.   Gastrointestinal:  Negative for abdominal pain.  Psychiatric/Behavioral:  Negative for behavioral problems and confusion.     Vital Signs: BP (!) 153/85   Pulse 64   Temp 97.7 F (36.5 C) (Oral)   Resp 17   Ht 5\' 1"  (1.549 m)   Wt 138 lb (62.6 kg)   SpO2 100%   BMI 26.07 kg/m     Physical Exam Vitals reviewed.  HENT:     Mouth/Throat:     Mouth: Mucous membranes are moist.  Cardiovascular:     Rate and Rhythm: Normal rate and regular rhythm.     Heart sounds: Normal heart sounds.  Pulmonary:     Effort: Pulmonary effort is normal.     Breath sounds: Normal breath sounds.  Abdominal:     Palpations: Abdomen is soft.  Musculoskeletal:        General: Normal range of motion.  Skin:    General: Skin is warm.  Neurological:     Mental Status: She is alert and oriented to person, place, and time.   Psychiatric:        Behavior: Behavior normal.     Imaging: No results found.  Labs:  CBC: Recent Labs    04/13/22 1245 07/13/22 1445 10/12/22 1028 02/09/23 1411  WBC 9.9 6.9 7.9 7.5  HGB 12.4 11.7* 12.8 12.9  HCT 38.0 34.7* 38.0 38.6  PLT 271 276 229 239    COAGS: No results for input(s): "INR", "APTT" in the last 8760 hours.  BMP: Recent Labs    04/13/22 1245 07/13/22 1445 10/12/22 1028 02/09/23 1411  NA 141 136 138 134*  K 3.7 3.9 3.8 3.9  CL 109 104 108 104  CO2 25 25 25 22   GLUCOSE 133* 99 100* 144*  BUN 14 14 10 19   CALCIUM 9.6 8.9 9.1 9.2  CREATININE 1.02* 0.96 0.77 0.95  GFRNONAA >60 >60 >60 >60    LIVER FUNCTION TESTS: Recent Labs    04/13/22 1245 07/13/22 1445 10/12/22 1028 02/09/23 1411  BILITOT 0.3 0.6 0.5 0.5  AST 23 19 27 28   ALT 22 19 24 24   ALKPHOS 91 72 76 67  PROT 7.5 7.3 7.4 7.9  ALBUMIN 4.0 3.9 4.1 4.4    TUMOR MARKERS: No results for input(s): "AFPTM", "CEA", "CA199", "CHROMGRNA" in the last 8760 hours.  Assessment and Plan:  Scheduled for PAC removal Placed 07/2021; last use 11/2021 Oncology asking for removal Scheduled today for same Pt is aware of procedure benefits and risks including but not limited to Infection; bleeding; vessel damage Agreeable to proceed Consent in chart  Thank you for this interesting consult.  I greatly enjoyed meeting Alexandra Bright and look forward to participating in their care.  A copy of this report was sent to the requesting provider on this date.  Electronically Signed: Robet Leu, PA-C 03/01/2023, 1:46 PM   I spent a total of    15 Minutes in face to face in clinical consultation, greater than 50% of which was counseling/coordinating care for Alexandra Bright removal

## 2023-03-01 NOTE — Procedures (Signed)
Interventional Radiology Procedure Note  Procedure: Port removal  Complications: None  Estimated Blood Loss: < 10 mL  Findings: Right chest porta-cath removed in entirety utilizing sharp and blunt dissection. Wound closed and Dermabond applied to incision.  Jodi Marble. Fredia Sorrow, M.D Pager:  [phone]

## 2023-03-31 ENCOUNTER — Encounter (INDEPENDENT_AMBULATORY_CARE_PROVIDER_SITE_OTHER): Payer: Self-pay | Admitting: *Deleted

## 2023-04-04 ENCOUNTER — Encounter: Payer: Self-pay | Admitting: Internal Medicine

## 2023-04-04 ENCOUNTER — Ambulatory Visit (INDEPENDENT_AMBULATORY_CARE_PROVIDER_SITE_OTHER): Payer: 59 | Admitting: Internal Medicine

## 2023-04-04 VITALS — BP 131/89 | HR 89 | Ht 61.0 in | Wt 137.0 lb

## 2023-04-04 DIAGNOSIS — I10 Essential (primary) hypertension: Secondary | ICD-10-CM | POA: Diagnosis not present

## 2023-04-04 DIAGNOSIS — G609 Hereditary and idiopathic neuropathy, unspecified: Secondary | ICD-10-CM | POA: Diagnosis not present

## 2023-04-04 DIAGNOSIS — C189 Malignant neoplasm of colon, unspecified: Secondary | ICD-10-CM

## 2023-04-04 DIAGNOSIS — Z0001 Encounter for general adult medical examination with abnormal findings: Secondary | ICD-10-CM | POA: Diagnosis not present

## 2023-04-04 DIAGNOSIS — E559 Vitamin D deficiency, unspecified: Secondary | ICD-10-CM

## 2023-04-04 DIAGNOSIS — L237 Allergic contact dermatitis due to plants, except food: Secondary | ICD-10-CM | POA: Diagnosis not present

## 2023-04-04 DIAGNOSIS — Z114 Encounter for screening for human immunodeficiency virus [HIV]: Secondary | ICD-10-CM

## 2023-04-04 DIAGNOSIS — Z122 Encounter for screening for malignant neoplasm of respiratory organs: Secondary | ICD-10-CM

## 2023-04-04 DIAGNOSIS — R739 Hyperglycemia, unspecified: Secondary | ICD-10-CM

## 2023-04-04 DIAGNOSIS — E782 Mixed hyperlipidemia: Secondary | ICD-10-CM

## 2023-04-04 DIAGNOSIS — F419 Anxiety disorder, unspecified: Secondary | ICD-10-CM

## 2023-04-04 DIAGNOSIS — F331 Major depressive disorder, recurrent, moderate: Secondary | ICD-10-CM

## 2023-04-04 MED ORDER — CLOBETASOL PROPIONATE 0.05 % EX CREA
1.0000 | TOPICAL_CREAM | Freq: Two times a day (BID) | CUTANEOUS | 0 refills | Status: DC
Start: 2023-04-04 — End: 2024-08-30

## 2023-04-04 MED ORDER — METHYLPREDNISOLONE 4 MG PO TBPK
ORAL_TABLET | ORAL | 0 refills | Status: DC
Start: 2023-04-04 — End: 2023-08-05

## 2023-04-04 NOTE — Patient Instructions (Addendum)
Please start applying Clobetasol cream for rash. Start taking Prednisone after 2 days if rash is persistent.  Please continue to take medications as prescribed.  Please continue to follow low salt diet and perform moderate exercise/walking at least 150 mins/week.

## 2023-04-04 NOTE — Assessment & Plan Note (Signed)
Physical exam as documented. CBC and CMP reviewed from the chart, added HbA1c, TSH, HIV antibody and Vit D.

## 2023-04-04 NOTE — Assessment & Plan Note (Signed)
Quit smoking in 2021, has > 20-pack-year smoking history Ordered low-dose CT chest after discussing with the patient.
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
## 2023-04-04 NOTE — Assessment & Plan Note (Signed)
BP Readings from Last 1 Encounters:  04/04/23 131/89   Well-controlled with losartan 100 mg daily now Counseled for compliance with the medications Advised DASH diet and moderate exercise/walking, at least 150 mins/week

## 2023-04-04 NOTE — Assessment & Plan Note (Signed)
S/p radiation and LAR surgery followed by chemotherapy Followed by oncology and GI - needs repeat colonoscopy, advised to contact GI
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
## 2023-04-04 NOTE — Assessment & Plan Note (Signed)
Started Medrol Dosepak and clobetasol cream
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
## 2023-04-05 LAB — LIPID PANEL
Chol/HDL Ratio: 2.4 ratio (ref 0.0–4.4)
Cholesterol, Total: 223 mg/dL — ABNORMAL HIGH (ref 100–199)
HDL: 93 mg/dL (ref >39)
LDL Chol Calc (NIH): 114 mg/dL — ABNORMAL HIGH (ref 0–99)
Triglycerides: 96 mg/dL (ref 0–149)
VLDL Cholesterol Cal: 16 mg/dL (ref 5–40)

## 2023-04-05 LAB — CMP14+EGFR
ALT: 23 IU/L (ref 0–32)
AST: 19 IU/L (ref 0–40)
Albumin/Globulin Ratio: 1.6 (ref 1.2–2.2)
Albumin: 4.7 g/dL (ref 3.8–4.9)
Alkaline Phosphatase: 86 IU/L (ref 44–121)
BUN/Creatinine Ratio: 18 (ref 9–23)
BUN: 15 mg/dL (ref 6–24)
Bilirubin Total: 0.3 mg/dL (ref 0.0–1.2)
CO2: 22 mmol/L (ref 20–29)
Calcium: 9.9 mg/dL (ref 8.7–10.2)
Chloride: 105 mmol/L (ref 96–106)
Creatinine, Ser: 0.83 mg/dL (ref 0.57–1.00)
Globulin, Total: 3 g/dL (ref 1.5–4.5)
Glucose: 86 mg/dL (ref 70–99)
Potassium: 4.2 mmol/L (ref 3.5–5.2)
Sodium: 143 mmol/L (ref 134–144)
Total Protein: 7.7 g/dL (ref 6.0–8.5)
eGFR: 83 mL/min/{1.73_m2} (ref >59)

## 2023-04-05 LAB — HEMOGLOBIN A1C
Est. average glucose Bld gHb Est-mCnc: 123 mg/dL
Hgb A1c MFr Bld: 5.9 % — ABNORMAL HIGH (ref 4.8–5.6)

## 2023-04-05 LAB — TSH+FREE T4
Free T4: 1.2 ng/dL (ref 0.82–1.77)
TSH: 0.769 u[IU]/mL (ref 0.450–4.500)

## 2023-04-05 LAB — VITAMIN D 25 HYDROXY (VIT D DEFICIENCY, FRACTURES): Vit D, 25-Hydroxy: 31.9 ng/mL (ref 30.0–100.0)

## 2023-04-05 LAB — HIV ANTIBODY (ROUTINE TESTING W REFLEX): HIV Screen 4th Generation wRfx: NONREACTIVE

## 2023-04-10 ENCOUNTER — Other Ambulatory Visit: Payer: Self-pay | Admitting: Internal Medicine

## 2023-04-10 DIAGNOSIS — I10 Essential (primary) hypertension: Secondary | ICD-10-CM

## 2023-05-12 ENCOUNTER — Other Ambulatory Visit: Payer: Self-pay | Admitting: Internal Medicine

## 2023-05-12 DIAGNOSIS — G609 Hereditary and idiopathic neuropathy, unspecified: Secondary | ICD-10-CM

## 2023-05-16 ENCOUNTER — Telehealth (INDEPENDENT_AMBULATORY_CARE_PROVIDER_SITE_OTHER): Payer: Self-pay | Admitting: Gastroenterology

## 2023-05-16 NOTE — Telephone Encounter (Signed)
Pt left voicemail in regards to a CT scan on 05/20/23.  No CT scan scheduled for 05/20/23 Left message on pt voicemail.

## 2023-05-20 ENCOUNTER — Inpatient Hospital Stay: Payer: 59

## 2023-05-20 ENCOUNTER — Ambulatory Visit (HOSPITAL_COMMUNITY): Payer: 59

## 2023-05-25 ENCOUNTER — Inpatient Hospital Stay: Payer: 59 | Admitting: Hematology

## 2023-05-29 ENCOUNTER — Other Ambulatory Visit: Payer: Self-pay | Admitting: Internal Medicine

## 2023-05-29 DIAGNOSIS — F419 Anxiety disorder, unspecified: Secondary | ICD-10-CM

## 2023-06-26 ENCOUNTER — Other Ambulatory Visit: Payer: Self-pay | Admitting: Internal Medicine

## 2023-06-26 DIAGNOSIS — F419 Anxiety disorder, unspecified: Secondary | ICD-10-CM

## 2023-06-27 ENCOUNTER — Other Ambulatory Visit: Payer: Self-pay | Admitting: Hematology

## 2023-07-06 ENCOUNTER — Other Ambulatory Visit: Payer: Self-pay | Admitting: Internal Medicine

## 2023-07-06 DIAGNOSIS — I10 Essential (primary) hypertension: Secondary | ICD-10-CM

## 2023-07-12 ENCOUNTER — Other Ambulatory Visit: Payer: Self-pay | Admitting: Internal Medicine

## 2023-07-12 DIAGNOSIS — G609 Hereditary and idiopathic neuropathy, unspecified: Secondary | ICD-10-CM

## 2023-07-15 ENCOUNTER — Telehealth: Payer: Self-pay | Admitting: Internal Medicine

## 2023-07-15 NOTE — Telephone Encounter (Signed)
Prescription Request  07/15/2023  LOV: 04/04/2023  What is the name of the medication or equipment? traMADol (ULTRAM) 50 MG tablet [[id number]]   Have you contacted your pharmacy to request a refill? Yes   Which pharmacy would you like this sent to?  [facility] [address], [place] - [address] [place] [address] Phone: [phone] Fax: [phone]    Patient notified that their request is being sent to the clinical staff for review and that they should receive a response within 2 business days.   Please advise at Mobile [phone] (mobile)

## 2023-07-15 NOTE — Telephone Encounter (Signed)
Spoke to patient , pharmacy has refills, patient is needing a PA, PA submitted

## 2023-07-21 ENCOUNTER — Telehealth (INDEPENDENT_AMBULATORY_CARE_PROVIDER_SITE_OTHER): Payer: Self-pay | Admitting: Gastroenterology

## 2023-07-21 MED ORDER — PEG 3350-KCL-NA BICARB-NACL 420 G PO SOLR: 4000.0000 mL | Freq: Once | ORAL | 0 refills | Status: AC

## 2023-07-21 NOTE — Telephone Encounter (Signed)
Pt left message in regards to schedule TCS. Pt was seen in office in June. Pt has been scheduled for 08/16/23 at 7:30. Prep sent to pharmacy. Instructions will be mailed to patient. Pt will be bringing copy of new insurance card by office also. Will complete PA once new insurance is received and will mailed pre op letter

## 2023-07-22 NOTE — Telephone Encounter (Signed)
Pt left message to give insurance ID number instead of making a special trip to bring card. Returned call to pt. Pt gave new insurance ID number. [employer] PA portion of website is down at this time. [employer] [place] ID# [id number] Medicaid # [id number] T

## 2023-07-23 ENCOUNTER — Other Ambulatory Visit: Payer: Self-pay | Admitting: Hematology

## 2023-07-26 ENCOUNTER — Other Ambulatory Visit: Payer: Self-pay | Admitting: *Deleted

## 2023-07-27 ENCOUNTER — Encounter (INDEPENDENT_AMBULATORY_CARE_PROVIDER_SITE_OTHER): Payer: Self-pay

## 2023-08-05 ENCOUNTER — Ambulatory Visit (INDEPENDENT_AMBULATORY_CARE_PROVIDER_SITE_OTHER): Payer: Medicaid Other | Admitting: Internal Medicine

## 2023-08-05 ENCOUNTER — Other Ambulatory Visit: Payer: Self-pay | Admitting: Internal Medicine

## 2023-08-05 ENCOUNTER — Encounter: Payer: Self-pay | Admitting: Internal Medicine

## 2023-08-05 VITALS — BP 121/82 | HR 80 | Ht 61.0 in | Wt 154.6 lb

## 2023-08-05 DIAGNOSIS — I10 Essential (primary) hypertension: Secondary | ICD-10-CM

## 2023-08-05 DIAGNOSIS — F419 Anxiety disorder, unspecified: Secondary | ICD-10-CM

## 2023-08-05 DIAGNOSIS — G609 Hereditary and idiopathic neuropathy, unspecified: Secondary | ICD-10-CM

## 2023-08-05 DIAGNOSIS — C189 Malignant neoplasm of colon, unspecified: Secondary | ICD-10-CM

## 2023-08-05 DIAGNOSIS — M5432 Sciatica, left side: Secondary | ICD-10-CM

## 2023-08-05 DIAGNOSIS — M5431 Sciatica, right side: Secondary | ICD-10-CM

## 2023-08-05 DIAGNOSIS — Z2821 Immunization not carried out because of patient refusal: Secondary | ICD-10-CM

## 2023-08-05 MED ORDER — CYCLOBENZAPRINE HCL 5 MG PO TABS
5.0000 mg | ORAL_TABLET | Freq: Two times a day (BID) | ORAL | 1 refills | Status: DC | PRN
Start: 2023-08-05 — End: 2023-11-25

## 2023-08-05 MED ORDER — LOSARTAN POTASSIUM 50 MG PO TABS: 50.0000 mg | ORAL_TABLET | Freq: Every day | ORAL | 1 refills | Status: DC
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
## 2023-08-05 NOTE — Assessment & Plan Note (Signed)
Pins-and-needles sensation in bilateral UE and LE Has tried gabapentin up to 600 mg TID Still persistent, but improved since switching to Lyrica On Lyrica to 100 mg BID and Elavil 25 mg qHS Tramadol PRN for severe pain

## 2023-08-05 NOTE — Patient Instructions (Signed)
Please take Flexeril as needed for muscle spasms.  Avoid heavy lifting and frequent bending. Okay to apply heating pad or back brace.  Please continue to take medications as prescribed.  Please continue to follow low carb diet and perform moderate exercise/walking at least 150 mins/week.

## 2023-08-05 NOTE — Assessment & Plan Note (Addendum)
BP Readings from Last 1 Encounters:  08/05/23 121/82   Well-controlled with losartan 50 mg daily now, DC old dose of 100 mg Counseled for compliance with the medications Advised DASH diet and moderate exercise/walking, at least 150 mins/week

## 2023-08-05 NOTE — Assessment & Plan Note (Signed)
S/p radiation and LAR surgery followed by chemotherapy Followed by oncology and GI - needs repeat colonoscopy, is scheduled on 09/24
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
## 2023-08-05 NOTE — Assessment & Plan Note (Signed)
Low back pain likely due to sciatica and/or DDD of lumbar spine Takes Tylenol for more mild to moderate pain and tramadol as needed for severe pain Simple back exercises material provided Heating pad and/or back brace Avoid heavy  lifting and frequent bending If persistent, will need imaging

## 2023-08-10 NOTE — Patient Instructions (Signed)
Alexandra Bright  08/10/2023     @PREFPERIOPPHARMACY @   Your procedure is scheduled on  08/16/2023.   Report to [facility] at  0600 A.M.   Call this number if you have problems the morning of surgery:  [phone]  If you experience any cold or flu symptoms such as cough, fever, chills, shortness of breath, etc. between now and your scheduled surgery, please notify us at the above number.   Remember:  Follow the diet and prep instructions given to you by the office.     Take these medicines the morning of surgery with A SIP OF WATER        bupropion,buspirone, flexerl(if needed), pregabalin, tramadol(if needed).     Do not wear jewelry, make-up or nail polish, including gel polish,  artificial nails, or any other type of covering on natural nails (fingers and  toes).  Do not wear lotions, powders, or perfumes, or deodorant.  Do not shave 48 hours prior to surgery.  Men may shave face and neck.  Do not bring valuables to the hospital.  [facility] is not responsible for any belongings or valuables.  Contacts, dentures or bridgework may not be worn into surgery.  Leave your suitcase in the car.  After surgery it may be brought to your room.  For patients admitted to the hospital, discharge time will be determined by your treatment team.  Patients discharged the day of surgery will not be allowed to drive home and must have someone with them for 24 hours.    Special instructions:   DO NOT smoke tobacco or vape for 24 hours before your procedure.  Please read over the following fact sheets that you were given. Anesthesia Post-op Instructions and Care and Recovery After Surgery      Colonoscopy, Adult, Care After The following information offers guidance on how to care for yourself after your procedure. Your health care provider may also give you more specific instructions. If you have problems or questions, contact your health care provider. What can I expect  after the procedure? After the procedure, it is common to have: A small amount of blood in your stool for 24 hours after the procedure. Some gas. Mild cramping or bloating of your abdomen. Follow these instructions at home: Eating and drinking  Drink enough fluid to keep your urine pale yellow. Follow instructions from your health care provider about eating or drinking restrictions. Resume your normal diet as told by your health care provider. Avoid heavy or fried foods that are hard to digest. Activity Rest as told by your health care provider. Avoid sitting for a long time without moving. Get up to take short walks every 1-2 hours. This is important to improve blood flow and breathing. Ask for help if you feel weak or unsteady. Return to your normal activities as told by your health care provider. Ask your health care provider what activities are safe for you. Managing cramping and bloating  Try walking around when you have cramps or feel bloated. If directed, apply heat to your abdomen as told by your health care provider. Use the heat source that your health care provider recommends, such as a moist heat pack or a heating pad. Place a towel between your skin and the heat source. Leave the heat on for 20-30 minutes. Remove the heat if your skin turns bright red. This is especially important if you are unable to feel pain, heat, or cold.  You have a greater risk of getting burned. General instructions If you were given a sedative during the procedure, it can affect you for several hours. Do not drive or operate machinery until your health care provider says that it is safe. For the first 24 hours after the procedure: Do not sign important documents. Do not drink alcohol. Do your regular daily activities at a slower pace than normal. Eat soft foods that are easy to digest. Take over-the-counter and prescription medicines only as told by your health care provider. Keep all follow-up  visits. This is important. Contact a health care provider if: You have blood in your stool 2-3 days after the procedure. Get help right away if: You have more than a small spotting of blood in your stool. You have large blood clots in your stool. You have swelling of your abdomen. You have nausea or vomiting. You have a fever. You have increasing pain in your abdomen that is not relieved with medicine. These symptoms may be an emergency. Get help right away. Call 911. Do not wait to see if the symptoms will go away. Do not drive yourself to the hospital. Summary After the procedure, it is common to have a small amount of blood in your stool. You may also have mild cramping and bloating of your abdomen. If you were given a sedative during the procedure, it can affect you for several hours. Do not drive or operate machinery until your health care provider says that it is safe. Get help right away if you have a lot of blood in your stool, nausea or vomiting, a fever, or increased pain in your abdomen. This information is not intended to replace advice given to you by your health care provider. Make sure you discuss any questions you have with your health care provider. Document Revised: 12/21/2022 Document Reviewed: 07/01/2021 Elsevier Patient Education  2024 [employer] Monitored Anesthesia Care, Care After The following information offers guidance on how to care for yourself after your procedure. Your health care provider may also give you more specific instructions. If you have problems or questions, contact your health care provider. What can I expect after the procedure? After the procedure, it is common to have: Tiredness. Little or no memory about what happened during or after the procedure. Impaired judgment when it comes to making decisions. Nausea or vomiting. Some trouble with balance. Follow these instructions at home: For the time period you were told by your health care  provider:  Rest. Do not participate in activities where you could fall or become injured. Do not drive or use machinery. Do not drink alcohol. Do not take sleeping pills or medicines that cause drowsiness. Do not make important decisions or sign legal documents. Do not take care of children on your own. Medicines Take over-the-counter and prescription medicines only as told by your health care provider. If you were prescribed antibiotics, take them as told by your health care provider. Do not stop using the antibiotic even if you start to feel better. Eating and drinking Follow instructions from your health care provider about what you may eat and drink. Drink enough fluid to keep your urine pale yellow. If you vomit: Drink clear fluids slowly and in small amounts as you are able. Clear fluids include water, ice chips, low-calorie sports drinks, and fruit juice that has water added to it (diluted fruit juice). Eat light and bland foods in small amounts as you are able. These foods include bananas, applesauce,  rice, lean meats, toast, and crackers. General instructions  Have a responsible adult stay with you for the time you are told. It is important to have someone help care for you until you are awake and alert. If you have sleep apnea, surgery and some medicines can increase your risk for breathing problems. Follow instructions from your health care provider about wearing your sleep device: When you are sleeping. This includes during daytime naps. While taking prescription pain medicines, sleeping medicines, or medicines that make you drowsy. Do not use any products that contain nicotine or tobacco. These products include cigarettes, chewing tobacco, and vaping devices, such as e-cigarettes. If you need help quitting, ask your health care provider. Contact a health care provider if: You feel nauseous or vomit every time you eat or drink. You feel light-headed. You are still sleepy or  having trouble with balance after 24 hours. You get a rash. You have a fever. You have redness or swelling around the IV site. Get help right away if: You have trouble breathing. You have new confusion after you get home. These symptoms may be an emergency. Get help right away. Call 911. Do not wait to see if the symptoms will go away. Do not drive yourself to the hospital. This information is not intended to replace advice given to you by your health care provider. Make sure you discuss any questions you have with your health care provider. Document Revised: 04/05/2022 Document Reviewed: 04/05/2022 Elsevier Patient Education  2024 [employer].

## 2023-08-12 ENCOUNTER — Encounter (HOSPITAL_COMMUNITY): Payer: Self-pay

## 2023-08-12 ENCOUNTER — Encounter (HOSPITAL_COMMUNITY)
Admission: RE | Admit: 2023-08-12 | Discharge: 2023-08-12 | Disposition: A | Discharge status: Home / Self Care | Payer: Medicaid Other | Source: Ambulatory Visit | Attending: Gastroenterology

## 2023-08-12 VITALS — BP 121/82 | HR 60 | Temp 97.9°F | Resp 18 | Ht 61.0 in | Wt 154.6 lb

## 2023-08-12 DIAGNOSIS — I10 Essential (primary) hypertension: Secondary | ICD-10-CM | POA: Insufficient documentation

## 2023-08-12 DIAGNOSIS — Z01818 Encounter for other preprocedural examination: Secondary | ICD-10-CM | POA: Diagnosis present

## 2023-08-12 DIAGNOSIS — R7303 Prediabetes: Secondary | ICD-10-CM | POA: Insufficient documentation

## 2023-08-12 DIAGNOSIS — D539 Nutritional anemia, unspecified: Secondary | ICD-10-CM | POA: Diagnosis not present

## 2023-08-12 LAB — BASIC METABOLIC PANEL
Anion gap: 12 (ref 5–15)
BUN: 19 mg/dL (ref 6–20)
CO2: 23 mmol/L (ref 22–32)
Calcium: 9.4 mg/dL (ref 8.9–10.3)
Chloride: 103 mmol/L (ref 98–111)
Creatinine, Ser: 0.89 mg/dL (ref 0.44–1.00)
GFR, Estimated: >60 mL/min (ref >60)
Glucose, Bld: 93 mg/dL (ref 70–99)
Potassium: 4.2 mmol/L (ref 3.5–5.1)
Sodium: 138 mmol/L (ref 135–145)

## 2023-08-12 LAB — CBC WITH DIFFERENTIAL/PLATELET
Abs Immature Granulocytes: 0.01 10*3/uL (ref 0.00–0.07)
Basophils Absolute: 0 10*3/uL (ref 0.0–0.1)
Basophils Relative: 1 %
Eosinophils Absolute: 0.3 10*3/uL (ref 0.0–0.5)
Eosinophils Relative: 4 %
HCT: 39.6 % (ref 36.0–46.0)
Hemoglobin: 12.7 g/dL (ref 12.0–15.0)
Immature Granulocytes: 0 %
Lymphocytes Relative: 33 %
Lymphs Abs: 2.3 10*3/uL (ref 0.7–4.0)
MCH: 30.6 pg (ref 26.0–34.0)
MCHC: 32.1 g/dL (ref 30.0–36.0)
MCV: 95.4 fL (ref 80.0–100.0)
Monocytes Absolute: 0.6 10*3/uL (ref 0.1–1.0)
Monocytes Relative: 9 %
Neutro Abs: 3.8 10*3/uL (ref 1.7–7.7)
Neutrophils Relative %: 53 %
Platelets: 244 10*3/uL (ref 150–400)
RBC: 4.15 MIL/uL (ref 3.87–5.11)
RDW: 13.3 % (ref 11.5–15.5)
WBC: 7 10*3/uL (ref 4.0–10.5)
nRBC: 0 % (ref 0.0–0.2)

## 2023-08-16 ENCOUNTER — Ambulatory Visit (HOSPITAL_BASED_OUTPATIENT_CLINIC_OR_DEPARTMENT_OTHER): Payer: Medicaid Other | Admitting: Certified Registered"

## 2023-08-16 ENCOUNTER — Ambulatory Visit (HOSPITAL_COMMUNITY)
Admission: RE | Admit: 2023-08-16 | Discharge: 2023-08-16 | Disposition: A | Discharge status: Home / Self Care | Payer: Medicaid Other | Source: Ambulatory Visit | Attending: Gastroenterology | Admitting: Gastroenterology

## 2023-08-16 ENCOUNTER — Encounter (HOSPITAL_COMMUNITY): Payer: Self-pay | Admitting: Gastroenterology

## 2023-08-16 ENCOUNTER — Encounter (HOSPITAL_COMMUNITY)
Admission: RE | Disposition: A | Discharge status: Home / Self Care | Payer: Self-pay | Source: Ambulatory Visit | Attending: Gastroenterology

## 2023-08-16 ENCOUNTER — Ambulatory Visit (HOSPITAL_COMMUNITY): Payer: Medicaid Other | Admitting: Certified Registered"

## 2023-08-16 DIAGNOSIS — Z85048 Personal history of other malignant neoplasm of rectum, rectosigmoid junction, and anus: Secondary | ICD-10-CM | POA: Diagnosis not present

## 2023-08-16 DIAGNOSIS — Z1211 Encounter for screening for malignant neoplasm of colon: Secondary | ICD-10-CM | POA: Diagnosis not present

## 2023-08-16 DIAGNOSIS — Z9221 Personal history of antineoplastic chemotherapy: Secondary | ICD-10-CM | POA: Insufficient documentation

## 2023-08-16 DIAGNOSIS — D123 Benign neoplasm of transverse colon: Secondary | ICD-10-CM | POA: Insufficient documentation

## 2023-08-16 DIAGNOSIS — D125 Benign neoplasm of sigmoid colon: Secondary | ICD-10-CM | POA: Diagnosis not present

## 2023-08-16 DIAGNOSIS — Z87891 Personal history of nicotine dependence: Secondary | ICD-10-CM | POA: Diagnosis not present

## 2023-08-16 DIAGNOSIS — K5730 Diverticulosis of large intestine without perforation or abscess without bleeding: Secondary | ICD-10-CM | POA: Diagnosis not present

## 2023-08-16 DIAGNOSIS — Z08 Encounter for follow-up examination after completed treatment for malignant neoplasm: Secondary | ICD-10-CM | POA: Insufficient documentation

## 2023-08-16 DIAGNOSIS — F419 Anxiety disorder, unspecified: Secondary | ICD-10-CM | POA: Diagnosis not present

## 2023-08-16 DIAGNOSIS — F32A Depression, unspecified: Secondary | ICD-10-CM | POA: Insufficient documentation

## 2023-08-16 DIAGNOSIS — I10 Essential (primary) hypertension: Secondary | ICD-10-CM | POA: Diagnosis not present

## 2023-08-16 DIAGNOSIS — K5900 Constipation, unspecified: Secondary | ICD-10-CM | POA: Diagnosis not present

## 2023-08-16 DIAGNOSIS — K635 Polyp of colon: Secondary | ICD-10-CM

## 2023-08-16 DIAGNOSIS — Z923 Personal history of irradiation: Secondary | ICD-10-CM | POA: Insufficient documentation

## 2023-08-16 DIAGNOSIS — Z980 Intestinal bypass and anastomosis status: Secondary | ICD-10-CM | POA: Diagnosis not present

## 2023-08-16 HISTORY — PX: POLYPECTOMY: SHX5525

## 2023-08-16 HISTORY — PX: COLONOSCOPY WITH PROPOFOL: SHX5780

## 2023-08-16 LAB — HM COLONOSCOPY

## 2023-08-16 SURGERY — COLONOSCOPY WITH PROPOFOL
Anesthesia: General

## 2023-08-16 MED ORDER — LACTATED RINGERS IV SOLN
INTRAVENOUS | Status: DC | PRN
Start: 2023-08-16 — End: 2023-08-16

## 2023-08-16 MED ORDER — PROPOFOL 10 MG/ML IV BOLUS
INTRAVENOUS | Status: DC | PRN
  Administered 2023-08-16: 50 mg via INTRAVENOUS
  Administered 2023-08-16: 100 mg via INTRAVENOUS
  Administered 2023-08-16: 50 mg via INTRAVENOUS

## 2023-08-16 MED ORDER — PROPOFOL 500 MG/50ML IV EMUL
INTRAVENOUS | Status: DC | PRN
  Administered 2023-08-16: 150 ug/kg/min via INTRAVENOUS

## 2023-08-16 MED ORDER — LIDOCAINE HCL (CARDIAC) PF 100 MG/5ML IV SOSY
PREFILLED_SYRINGE | INTRAVENOUS | Status: DC | PRN
  Administered 2023-08-16: 50 mg via INTRAVENOUS

## 2023-08-16 NOTE — Discharge Instructions (Signed)
You are being discharged to home.  Resume your previous diet.  We are waiting for your pathology results.  Your physician has recommended a repeat colonoscopy in three years for surveillance.  

## 2023-08-16 NOTE — Anesthesia Preprocedure Evaluation (Signed)
Anesthesia Evaluation  Patient identified by MRN, date of birth, ID band Patient awake    Reviewed: Allergy & Precautions, H&P , NPO status , Patient's Chart, lab work & pertinent test results, reviewed documented beta blocker date and time   Airway Mallampati: II  TM Distance: >3 FB Neck ROM: full    Dental no notable dental hx.    Pulmonary neg pulmonary ROS, former smoker   Pulmonary exam normal breath sounds clear to auscultation       Cardiovascular Exercise Tolerance: Good hypertension, negative cardio ROS  Rhythm:regular Rate:Normal     Neuro/Psych  PSYCHIATRIC DISORDERS Anxiety Depression     Neuromuscular disease negative neurological ROS  negative psych ROS   GI/Hepatic negative GI ROS, Neg liver ROS,,,  Endo/Other  negative endocrine ROS    Renal/GU negative Renal ROS  negative genitourinary   Musculoskeletal   Abdominal   Peds  Hematology negative hematology ROS (+) Blood dyscrasia, anemia   Anesthesia Other Findings   Reproductive/Obstetrics negative OB ROS                             Anesthesia Physical Anesthesia Plan  ASA: 2  Anesthesia Plan: General   Post-op Pain Management:    Induction:   PONV Risk Score and Plan:   Airway Management Planned:   Additional Equipment:   Intra-op Plan:   Post-operative Plan:   Informed Consent: I have reviewed the patients History and Physical, chart, labs and discussed the procedure including the risks, benefits and alternatives for the proposed anesthesia with the patient or authorized representative who has indicated his/her understanding and acceptance.     Dental Advisory Given  Plan Discussed with: CRNA  Anesthesia Plan Comments:        Anesthesia Quick Evaluation
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
## 2023-08-16 NOTE — Anesthesia Procedure Notes (Signed)
Date/Time: 08/16/2023 7:26 AM  Performed by: Julian Reil, CRNAPre-anesthesia Checklist: Patient identified, Emergency Drugs available, Suction available and Patient being monitored Patient Re-evaluated:Patient Re-evaluated prior to induction Oxygen Delivery Method: Nasal cannula Induction Type: IV induction Placement Confirmation: positive ETCO2

## 2023-08-16 NOTE — Transfer of Care (Signed)
Immediate Anesthesia Transfer of Care Note  Patient: AMBRI DA  Procedure(s) Performed: COLONOSCOPY WITH PROPOFOL POLYPECTOMY  Patient Location: Short Stay  Anesthesia Type:General  Level of Consciousness: drowsy  Airway & Oxygen Therapy: Patient Spontanous Breathing  Post-op Assessment: Report given to RN and Post -op Vital signs reviewed and stable  Post vital signs: Reviewed and stable  Last Vitals:  Vitals Value Taken Time  BP    Temp    Pulse    Resp    SpO2      Last Pain:  Vitals:   08/16/23 0726  TempSrc:   PainSc: 2          Complications: No notable events documented.

## 2023-08-16 NOTE — Op Note (Signed)
[facility] Patient Name: Alexandra Bright Procedure Date: 08/16/2023 7:05 AM MRN: [id number] Date of Birth: Mar 27, 1966 Attending MD: Katrinka Blazing , , [id number] CSN: [id number] Age: 57 Admit Type: Outpatient Procedure:                Colonoscopy Indications:              High risk colon cancer surveillance: Personal                            history of colon cancer Providers:                Katrinka Blazing, Sheran Fava, Elinor Parkinson Referring MD:              Medicines:                Monitored Anesthesia Care Complications:            No immediate complications. Estimated Blood Loss:     Estimated blood loss: none. Procedure:                Pre-Anesthesia Assessment:                           - Prior to the procedure, a History and Physical                            was performed, and patient medications, allergies                            and sensitivities were reviewed. The patient's                            tolerance of previous anesthesia was reviewed.                           - The risks and benefits of the procedure and the                            sedation options and risks were discussed with the                            patient. All questions were answered and informed                            consent was obtained.                           - ASA Grade Assessment: II - A patient with mild                            systemic disease.                           After obtaining informed consent, the colonoscope  was passed under direct vision. Throughout the                            procedure, the patient's blood pressure, pulse, and                            oxygen saturations were monitored continuously. The                            [id number]-[id number] ([id number]) scope was introduced through                            the anus and advanced to the the cecum, identified                            by  appendiceal orifice and ileocecal valve. The                            colonoscopy was performed without difficulty. The                            patient tolerated the procedure well. The quality                            of the bowel preparation was adequate. Scope In: 7:27:04 AM Scope Out: 7:48:11 AM Scope Withdrawal Time: 0 hours 10 minutes 49 seconds  Total Procedure Duration: 0 hours 21 minutes 7 seconds  Findings:      The perianal and digital rectal examinations were normal.      A few medium-mouthed diverticula were found in the ascending colon.      Two sessile polyps were found in the transverse colon. The polyps were 3       to 4 mm in size. These polyps were removed with a cold snare. Resection       and retrieval were complete.      A tattoo was seen in the transverse colon. The tattoo site appeared       normal.      A 3 mm polyp was found in the sigmoid colon. The polyp was sessile. The       polyp was removed with a cold snare. Resection and retrieval were       complete.      There was evidence of a prior end-to-end low-anterior anastomosis in the       distal rectum. This was patent and was characterized by healthy       appearing mucosa.      The retroflexed view of the distal rectum and anal verge was normal and       showed no anal or rectal abnormalities. Impression:               - Diverticulosis in the ascending colon.                           - Two 3 to 4 mm polyps in the transverse colon,  removed with a cold snare. Resected and retrieved.                           - A tattoo was seen in the transverse colon. The                            tattoo site appeared normal.                           - One 3 mm polyp in the sigmoid colon, removed with                            a cold snare. Resected and retrieved.                           - Patent end-to-end low-anterior anastomosis,                            characterized by  healthy appearing mucosa.                           - The distal rectum and anal verge are normal on                            retroflexion view. Moderate Sedation:      Per Anesthesia Care Recommendation:           - Discharge patient to home (ambulatory).                           - Resume previous diet.                           - Await pathology results.                           - Repeat colonoscopy in 3 years for surveillance. Procedure Code(s):        --- Professional ---                           [phone], Colonoscopy, flexible; with removal of                            tumor(s), polyp(s), or other lesion(s) by snare                            technique Diagnosis Code(s):        --- Professional ---                           U04.540, Personal history of other malignant                            neoplasm of large intestine  D12.3, Benign neoplasm of transverse colon (hepatic                            flexure or splenic flexure)                           D12.5, Benign neoplasm of sigmoid colon                           Z98.0, Intestinal bypass and anastomosis status                           K57.30, Diverticulosis of large intestine without                            perforation or abscess without bleeding CPT copyright 2022 American [employer] Association. All rights reserved. The codes documented in this report are preliminary and upon coder review may  be revised to meet current compliance requirements. Katrinka Blazing, MD Katrinka Blazing,  08/16/2023 7:55:38 AM This report has been signed electronically. Number of Addenda: 0

## 2023-08-17 ENCOUNTER — Encounter (INDEPENDENT_AMBULATORY_CARE_PROVIDER_SITE_OTHER): Payer: Self-pay | Admitting: *Deleted

## 2023-08-17 LAB — SURGICAL PATHOLOGY

## 2023-08-17 NOTE — Anesthesia Postprocedure Evaluation (Signed)
Anesthesia Post Note  Patient: Alexandra Bright  Procedure(s) Performed: COLONOSCOPY WITH PROPOFOL POLYPECTOMY  Patient location during evaluation: Phase II Anesthesia Type: General Level of consciousness: awake Pain management: pain level controlled Vital Signs Assessment: post-procedure vital signs reviewed and stable Respiratory status: spontaneous breathing and respiratory function stable Cardiovascular status: blood pressure returned to baseline and stable Postop Assessment: no headache and no apparent nausea or vomiting Anesthetic complications: no Comments: Late entry   No notable events documented.   Last Vitals:  Vitals:   08/16/23 0648 08/16/23 0752  BP: 122/83 113/81  Pulse: 74 87  Resp: 20 16  Temp: (!) 36.3 C   SpO2: (!) 25% 94%    Last Pain:  Vitals:   08/16/23 0752  TempSrc:   PainSc: 0-No pain                 [name]

## 2023-08-19 ENCOUNTER — Encounter (INDEPENDENT_AMBULATORY_CARE_PROVIDER_SITE_OTHER): Payer: Self-pay | Admitting: *Deleted

## 2023-08-24 ENCOUNTER — Encounter (HOSPITAL_COMMUNITY): Payer: Self-pay | Admitting: Gastroenterology

## 2023-09-02 ENCOUNTER — Other Ambulatory Visit: Payer: Self-pay | Admitting: Internal Medicine

## 2023-09-02 DIAGNOSIS — F419 Anxiety disorder, unspecified: Secondary | ICD-10-CM

## 2023-09-12 ENCOUNTER — Other Ambulatory Visit: Payer: Self-pay | Admitting: Internal Medicine

## 2023-09-12 DIAGNOSIS — G609 Hereditary and idiopathic neuropathy, unspecified: Secondary | ICD-10-CM

## 2023-09-23 ENCOUNTER — Institutional Professional Consult (permissible substitution): Payer: Medicaid Other | Admitting: Professional Counselor

## 2023-09-27 ENCOUNTER — Other Ambulatory Visit: Payer: Self-pay | Admitting: Internal Medicine

## 2023-09-27 DIAGNOSIS — F419 Anxiety disorder, unspecified: Secondary | ICD-10-CM

## 2023-09-30 ENCOUNTER — Other Ambulatory Visit: Payer: Self-pay | Admitting: Internal Medicine

## 2023-09-30 DIAGNOSIS — F419 Anxiety disorder, unspecified: Secondary | ICD-10-CM

## 2023-10-25 ENCOUNTER — Other Ambulatory Visit: Payer: Self-pay | Admitting: Internal Medicine

## 2023-10-25 DIAGNOSIS — G609 Hereditary and idiopathic neuropathy, unspecified: Secondary | ICD-10-CM

## 2023-10-31 ENCOUNTER — Other Ambulatory Visit: Payer: Self-pay | Admitting: Internal Medicine

## 2023-10-31 DIAGNOSIS — F419 Anxiety disorder, unspecified: Secondary | ICD-10-CM

## 2023-11-04 ENCOUNTER — Ambulatory Visit: Payer: Medicaid Other | Admitting: Professional Counselor

## 2023-11-04 DIAGNOSIS — F411 Generalized anxiety disorder: Secondary | ICD-10-CM

## 2023-11-04 NOTE — Patient Instructions (Addendum)
If your symptoms worsen or you have thoughts of suicide/homicide, PLEASE SEEK IMMEDIATE MEDICAL ATTENTION.  You may always call:   National Suicide Hotline: 988 or 6817140769 [place] Crisis Line: 845-113-6142 Crisis Recovery in [place]: 848 197 4911     These are available 24 hours a day, 7 days a week.    Patient has been recommended to engage in Al-Anon for support.
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
## 2023-11-18 ENCOUNTER — Telehealth (INDEPENDENT_AMBULATORY_CARE_PROVIDER_SITE_OTHER): Payer: Medicaid Other | Admitting: Professional Counselor

## 2023-11-18 ENCOUNTER — Ambulatory Visit: Payer: Medicaid Other | Admitting: Professional Counselor

## 2023-11-18 DIAGNOSIS — F411 Generalized anxiety disorder: Secondary | ICD-10-CM

## 2023-11-18 NOTE — BH Specialist Note (Signed)
[facility] Telephone Follow-up  MRN: [id number] NAME: Alexandra Bright Date: 11/18/23  Start time: Start Time: 0945 End time: Stop Time: 1015 Total time: Total Time in Minutes (Visit): 30 Call number: Visit Number: 2- Second Visit  Reason for call today:  The patient is a 57 year old female returning for a collaborative care follow-up. She presents in a relatively positive mood but continues to experience anxiety and depressive episodes, primarily related to her relationship. The patient is involved with a partner battling addiction, which creates ongoing fear and emotional strain as she worries about potential relapses. She reports feeling the need to provide significant support to prevent his substance use, leading to feelings of unhappiness and emotional disconnection in the relationship. Despite this, she does not wish to leave and exhibits signs of codependency.  During the session, the patient processed her feelings and emotions surrounding the relationship. Behavioral counseling provided support and suggestions, including considering Al-Anon as a resource for peer support for loved ones of individuals struggling with addiction. She was also encouraged to prioritize self-care and establish healthier boundaries. To assist with this, worksheets on boundary-setting and CBT techniques to address negative thinking patterns were provided. The patient appreciated these resources, and a follow-up is scheduled in two weeks to review her progress and discuss the worksheets.   PHQ-9 Scores:     11/04/2023    9:30 AM 08/05/2023    8:03 AM 04/04/2023    8:07 AM 12/30/2022    8:38 AM 11/18/2022    9:19 AM  Depression screen PHQ 2/9  Decreased Interest 1 0 0 0 0  Down, Depressed, Hopeless 1 1 2  0 3  PHQ - 2 Score 2 1 2  0 3  Altered sleeping 1 0 0  0  Tired, decreased energy 1 1 1  1   Change in appetite 1 1 0  0  Feeling bad or failure about yourself  1 1 1  3   Trouble concentrating 1 1 3   3   Moving slowly or fidgety/restless 0 1 3  3   Suicidal thoughts 0 0 0  0  PHQ-9 Score 7 6 10  13   Difficult doing work/chores Somewhat difficult    Very difficult   GAD-7 Scores:     11/04/2023    9:31 AM 08/05/2023    8:03 AM 04/04/2023    8:08 AM 11/18/2022    9:20 AM  GAD 7 : Generalized Anxiety Score  Nervous, Anxious, on Edge 2 1 2 3   Control/stop worrying 2 0 2 3  Worry too much - different things 2 1 2 3   Trouble relaxing 2 1 2 3   Restless 0 0 2 2  Easily annoyed or irritable 2 1 2 3   Afraid - awful might happen 3 1 2 3   Total GAD 7 Score 13 5 14 20   Anxiety Difficulty Somewhat difficult   Very difficult    Stress Current stressors:  Relationship Sleep:  Fair Appetite:  Good Coping ability:  Fair Patient taking medications as prescribed:  Yes  Current medications:  Outpatient Encounter Medications as of 11/18/2023  Medication Sig   acetaminophen (TYLENOL) 500 MG tablet Take 1,000 mg by mouth every 6 (six) hours as needed for mild pain, moderate pain or headache.   amitriptyline (ELAVIL) 25 MG tablet TAKE 1 TABLET BY MOUTH AT BEDTIME   buPROPion (WELLBUTRIN SR) 200 MG 12 hr tablet Take 1 tablet by mouth twice daily   busPIRone (BUSPAR) 10 MG tablet Take 1 tablet  by mouth twice daily   clobetasol cream (TEMOVATE) 0.05 % Apply 1 Application topically 2 (two) times daily.   cyclobenzaprine (FLEXERIL) 5 MG tablet Take 1 tablet (5 mg total) by mouth 2 (two) times daily as needed for muscle spasms.   Docusate Calcium (STOOL SOFTENER PO) Take 1 tablet by mouth daily.   doxylamine, Sleep, (SLEEP AID) 25 MG tablet Take 50 mg by mouth at bedtime.   losartan (COZAAR) 50 MG tablet Take 1 tablet (50 mg total) by mouth daily.   Multiple Vitamins-Minerals (MULTIVITAMIN WITH MINERALS) tablet Take 1 tablet by mouth in the morning. Women's Multivitamin   pregabalin (LYRICA) 100 MG capsule Take 1 capsule by mouth twice daily   traMADol (ULTRAM) 50 MG tablet Take 1 tablet (50 mg total)  by mouth 3 (three) times daily as needed for moderate pain (pain score 4-6) or severe pain (pain score 7-10).   vitamin B-12 (CYANOCOBALAMIN) 1000 MCG tablet Take 1,000 mcg by mouth daily.   No facility-administered encounter medications on file as of 11/18/2023.     Self-harm Behaviors Risk Assessment Self-harm risk factors:  Depression Patient endorses recent thoughts of harming self:  Denies  Danger to Others Risk Assessment Danger to others risk factors:  None Patient endorses recent thoughts of harming others:  Denies   Substance Use Assessment Patient recently consumed alcohol:  No  Alcohol Use Disorder Identification Test (AUDIT):     03/12/2021    3:48 PM 01/25/2022   10:15 AM  Alcohol Use Disorder Test (AUDIT)  1. How often do you have a drink containing alcohol? 0 0  2. How many drinks containing alcohol do you have on a typical day when you are drinking? 0 0  3. How often do you have six or more drinks on one occasion? 0 0  AUDIT-C Score 0 0    Goals, Interventions and Follow-up Plan Goals:  Self care, better boundaries  Interventions: Mindfulness or Relaxation Training and CBT Cognitive Behavioral Therapy Follow-up Plan:  bi-weekly counseling    Reuel Boom
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
## 2023-11-21 ENCOUNTER — Telehealth: Payer: Self-pay

## 2023-11-21 NOTE — Telephone Encounter (Signed)
Copied from CRM [id number]. Topic: Referral - Question >> Nov 21, 2023  3:32 PM Fonda Kinder J wrote: Reason for CRM: Pt says the dermatologist she was referred to does not accept her insurance and wants to know if she could be referred to someone who accepts insurance

## 2023-11-22 ENCOUNTER — Other Ambulatory Visit: Payer: Self-pay | Admitting: Internal Medicine

## 2023-11-22 DIAGNOSIS — G609 Hereditary and idiopathic neuropathy, unspecified: Secondary | ICD-10-CM

## 2023-11-25 ENCOUNTER — Other Ambulatory Visit: Payer: Self-pay | Admitting: Internal Medicine

## 2023-11-25 DIAGNOSIS — M5432 Sciatica, left side: Secondary | ICD-10-CM

## 2023-11-28 ENCOUNTER — Other Ambulatory Visit: Payer: Self-pay | Admitting: Internal Medicine

## 2023-11-28 DIAGNOSIS — F419 Anxiety disorder, unspecified: Secondary | ICD-10-CM

## 2023-12-02 ENCOUNTER — Ambulatory Visit: Payer: Medicaid Other | Admitting: Internal Medicine

## 2023-12-09 ENCOUNTER — Ambulatory Visit: Payer: Medicaid Other | Admitting: Professional Counselor

## 2023-12-24 ENCOUNTER — Other Ambulatory Visit: Payer: Self-pay | Admitting: Internal Medicine

## 2023-12-24 DIAGNOSIS — F419 Anxiety disorder, unspecified: Secondary | ICD-10-CM

## 2023-12-26 ENCOUNTER — Encounter: Payer: Self-pay | Admitting: Internal Medicine

## 2023-12-26 ENCOUNTER — Ambulatory Visit (INDEPENDENT_AMBULATORY_CARE_PROVIDER_SITE_OTHER): Payer: Medicaid Other | Admitting: Internal Medicine

## 2023-12-26 VITALS — BP 128/70 | HR 82 | Ht 61.0 in | Wt 155.8 lb

## 2023-12-26 DIAGNOSIS — C189 Malignant neoplasm of colon, unspecified: Secondary | ICD-10-CM | POA: Diagnosis not present

## 2023-12-26 DIAGNOSIS — F419 Anxiety disorder, unspecified: Secondary | ICD-10-CM

## 2023-12-26 DIAGNOSIS — M5432 Sciatica, left side: Secondary | ICD-10-CM

## 2023-12-26 DIAGNOSIS — E782 Mixed hyperlipidemia: Secondary | ICD-10-CM

## 2023-12-26 DIAGNOSIS — I10 Essential (primary) hypertension: Secondary | ICD-10-CM | POA: Diagnosis not present

## 2023-12-26 DIAGNOSIS — M5431 Sciatica, right side: Secondary | ICD-10-CM | POA: Diagnosis not present

## 2023-12-26 DIAGNOSIS — R7303 Prediabetes: Secondary | ICD-10-CM | POA: Insufficient documentation

## 2023-12-26 DIAGNOSIS — F331 Major depressive disorder, recurrent, moderate: Secondary | ICD-10-CM

## 2023-12-26 DIAGNOSIS — E559 Vitamin D deficiency, unspecified: Secondary | ICD-10-CM

## 2023-12-26 NOTE — Assessment & Plan Note (Signed)
S/p radiation and LAR surgery followed by chemotherapy Followed by oncology and GI
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
## 2023-12-26 NOTE — Patient Instructions (Signed)
Please perform simple back exercises as advised.  Please take Tylenol for mild-moderate pain. Take Tramadol as needed for severe pain only.  Please continue to take medications as prescribed.  Please continue to follow low sal diet and ambulate as tolerated.  Please get fasting blood tests done before the next visit.
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
## 2023-12-26 NOTE — Assessment & Plan Note (Addendum)
BP Readings from Last 1 Encounters:  12/26/23 128/70   Well-controlled with losartan 50 mg daily now Counseled for compliance with the medications Advised DASH diet and moderate exercise/walking, at least 150 mins/week
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
## 2023-12-27 NOTE — Assessment & Plan Note (Signed)
Lab Results  Component Value Date   HGBA1C 5.9 (H) 04/04/2023   Advised to follow DASH diet

## 2023-12-27 NOTE — Assessment & Plan Note (Signed)
 Low back pain likely due to sciatica and/or DDD of lumbar spine Takes Tylenol  for more mild to moderate pain and tramadol  as needed for severe pain Simple back exercises material provided Heating pad and/or back brace Avoid heavy  lifting and frequent bending Due to persistent pain, will need imaging - check x-ray of lumbar spine

## 2023-12-27 NOTE — Assessment & Plan Note (Signed)
Check lipid profile Advised to follow DASH diet 

## 2023-12-28 ENCOUNTER — Other Ambulatory Visit: Payer: Self-pay | Admitting: Internal Medicine

## 2023-12-28 DIAGNOSIS — G609 Hereditary and idiopathic neuropathy, unspecified: Secondary | ICD-10-CM

## 2024-01-11 ENCOUNTER — Ambulatory Visit: Payer: Medicaid Other | Admitting: Dermatology

## 2024-01-21 ENCOUNTER — Other Ambulatory Visit: Payer: Self-pay | Admitting: Internal Medicine

## 2024-01-21 DIAGNOSIS — F419 Anxiety disorder, unspecified: Secondary | ICD-10-CM

## 2024-02-20 ENCOUNTER — Ambulatory Visit (INDEPENDENT_AMBULATORY_CARE_PROVIDER_SITE_OTHER): Payer: 59 | Admitting: Gastroenterology

## 2024-02-20 ENCOUNTER — Other Ambulatory Visit: Payer: Self-pay | Admitting: Internal Medicine

## 2024-02-20 DIAGNOSIS — F419 Anxiety disorder, unspecified: Secondary | ICD-10-CM

## 2024-02-22 ENCOUNTER — Other Ambulatory Visit: Payer: Self-pay | Admitting: Internal Medicine

## 2024-02-22 DIAGNOSIS — F419 Anxiety disorder, unspecified: Secondary | ICD-10-CM

## 2024-03-17 ENCOUNTER — Other Ambulatory Visit: Payer: Self-pay | Admitting: Internal Medicine

## 2024-03-17 DIAGNOSIS — F419 Anxiety disorder, unspecified: Secondary | ICD-10-CM

## 2024-03-19 ENCOUNTER — Other Ambulatory Visit: Payer: Self-pay | Admitting: Internal Medicine

## 2024-03-19 DIAGNOSIS — G609 Hereditary and idiopathic neuropathy, unspecified: Secondary | ICD-10-CM

## 2024-03-27 ENCOUNTER — Other Ambulatory Visit: Payer: Self-pay | Admitting: Internal Medicine

## 2024-03-27 DIAGNOSIS — M5431 Sciatica, right side: Secondary | ICD-10-CM

## 2024-03-28 ENCOUNTER — Ambulatory Visit (HOSPITAL_COMMUNITY)
Admission: RE | Admit: 2024-03-28 | Discharge: 2024-03-28 | Disposition: A | Discharge status: Home / Self Care | Source: Ambulatory Visit | Attending: Internal Medicine | Admitting: Internal Medicine

## 2024-03-28 ENCOUNTER — Other Ambulatory Visit (HOSPITAL_COMMUNITY)
Admission: RE | Admit: 2024-03-28 | Discharge: 2024-03-28 | Disposition: A | Discharge status: Home / Self Care | Source: Ambulatory Visit | Attending: Internal Medicine | Admitting: Internal Medicine

## 2024-03-28 DIAGNOSIS — M5431 Sciatica, right side: Secondary | ICD-10-CM | POA: Diagnosis present

## 2024-03-28 DIAGNOSIS — M5432 Sciatica, left side: Secondary | ICD-10-CM | POA: Diagnosis present

## 2024-03-28 LAB — CBC WITH DIFFERENTIAL/PLATELET
Abs Immature Granulocytes: 0.01 10*3/uL (ref 0.00–0.07)
Basophils Absolute: 0 10*3/uL (ref 0.0–0.1)
Basophils Relative: 1 %
Eosinophils Absolute: 0.3 10*3/uL (ref 0.0–0.5)
Eosinophils Relative: 3 %
HCT: 40.6 % (ref 36.0–46.0)
Hemoglobin: 13.9 g/dL (ref 12.0–15.0)
Immature Granulocytes: 0 %
Lymphocytes Relative: 29 %
Lymphs Abs: 2.5 10*3/uL (ref 0.7–4.0)
MCH: 32.3 pg (ref 26.0–34.0)
MCHC: 34.2 g/dL (ref 30.0–36.0)
MCV: 94.2 fL (ref 80.0–100.0)
Monocytes Absolute: 0.7 10*3/uL (ref 0.1–1.0)
Monocytes Relative: 8 %
Neutro Abs: 4.9 10*3/uL (ref 1.7–7.7)
Neutrophils Relative %: 59 %
Platelets: 254 10*3/uL (ref 150–400)
RBC: 4.31 MIL/uL (ref 3.87–5.11)
RDW: 12.4 % (ref 11.5–15.5)
WBC: 8.4 10*3/uL (ref 4.0–10.5)
nRBC: 0 % (ref 0.0–0.2)

## 2024-03-28 LAB — COMPREHENSIVE METABOLIC PANEL WITH GFR
ALT: 25 U/L (ref 0–44)
AST: 29 U/L (ref 15–41)
Albumin: 4.3 g/dL (ref 3.5–5.0)
Alkaline Phosphatase: 81 U/L (ref 38–126)
Anion gap: 8 (ref 5–15)
BUN: 18 mg/dL (ref 6–20)
CO2: 22 mmol/L (ref 22–32)
Calcium: 9.7 mg/dL (ref 8.9–10.3)
Chloride: 106 mmol/L (ref 98–111)
Creatinine, Ser: 0.79 mg/dL (ref 0.44–1.00)
GFR, Estimated: >60 mL/min (ref >60)
Glucose, Bld: 106 mg/dL — ABNORMAL HIGH (ref 70–99)
Potassium: 3.9 mmol/L (ref 3.5–5.1)
Sodium: 136 mmol/L (ref 135–145)
Total Bilirubin: 0.2 mg/dL (ref 0.0–1.2)
Total Protein: 7.6 g/dL (ref 6.5–8.1)

## 2024-03-28 LAB — LIPID PANEL
Cholesterol: 214 mg/dL — ABNORMAL HIGH (ref 0–200)
HDL: 66 mg/dL (ref >40)
LDL Cholesterol: UNDETERMINED mg/dL (ref 0–99)
Total CHOL/HDL Ratio: 3.2 ratio
Triglycerides: 403 mg/dL — ABNORMAL HIGH (ref <150)
VLDL: UNDETERMINED mg/dL (ref 0–40)

## 2024-03-28 LAB — VITAMIN D 25 HYDROXY (VIT D DEFICIENCY, FRACTURES): Vit D, 25-Hydroxy: 36.74 ng/mL (ref 30–100)

## 2024-03-28 LAB — HEMOGLOBIN A1C
Hgb A1c MFr Bld: 5.4 % (ref 4.8–5.6)
Mean Plasma Glucose: 108.28 mg/dL

## 2024-03-28 LAB — TSH: TSH: 2.605 u[IU]/mL (ref 0.350–4.500)

## 2024-03-29 LAB — LDL CHOLESTEROL, DIRECT: Direct LDL: 109 mg/dL — ABNORMAL HIGH (ref 0–99)

## 2024-04-04 LAB — MISC LABCORP TEST (SEND OUT): Labcorp test code: 738526

## 2024-04-17 ENCOUNTER — Other Ambulatory Visit: Payer: Self-pay | Admitting: Internal Medicine

## 2024-04-17 DIAGNOSIS — I10 Essential (primary) hypertension: Secondary | ICD-10-CM

## 2024-04-30 ENCOUNTER — Other Ambulatory Visit (HOSPITAL_COMMUNITY): Payer: Self-pay | Admitting: Internal Medicine

## 2024-04-30 ENCOUNTER — Ambulatory Visit: Payer: Medicaid Other | Admitting: Internal Medicine

## 2024-04-30 ENCOUNTER — Other Ambulatory Visit: Payer: Self-pay | Admitting: *Deleted

## 2024-04-30 ENCOUNTER — Encounter: Payer: Self-pay | Admitting: Internal Medicine

## 2024-04-30 VITALS — BP 128/83 | HR 87 | Ht 61.0 in | Wt 155.8 lb

## 2024-04-30 DIAGNOSIS — I10 Essential (primary) hypertension: Secondary | ICD-10-CM | POA: Diagnosis not present

## 2024-04-30 DIAGNOSIS — N63 Unspecified lump in unspecified breast: Secondary | ICD-10-CM

## 2024-04-30 DIAGNOSIS — Z1231 Encounter for screening mammogram for malignant neoplasm of breast: Secondary | ICD-10-CM

## 2024-04-30 DIAGNOSIS — C189 Malignant neoplasm of colon, unspecified: Secondary | ICD-10-CM | POA: Diagnosis not present

## 2024-04-30 DIAGNOSIS — F331 Major depressive disorder, recurrent, moderate: Secondary | ICD-10-CM

## 2024-04-30 DIAGNOSIS — Z0001 Encounter for general adult medical examination with abnormal findings: Secondary | ICD-10-CM

## 2024-04-30 DIAGNOSIS — S1086XA Insect bite of other specified part of neck, initial encounter: Secondary | ICD-10-CM

## 2024-04-30 DIAGNOSIS — C20 Malignant neoplasm of rectum: Secondary | ICD-10-CM

## 2024-04-30 DIAGNOSIS — M5416 Radiculopathy, lumbar region: Secondary | ICD-10-CM

## 2024-04-30 DIAGNOSIS — G609 Hereditary and idiopathic neuropathy, unspecified: Secondary | ICD-10-CM

## 2024-04-30 DIAGNOSIS — W57XXXA Bitten or stung by nonvenomous insect and other nonvenomous arthropods, initial encounter: Secondary | ICD-10-CM

## 2024-04-30 MED ORDER — DOXYCYCLINE HYCLATE 100 MG PO TABS: 100.0000 mg | ORAL_TABLET | Freq: Two times a day (BID) | ORAL | 0 refills | Status: DC

## 2024-04-30 MED ORDER — PREGABALIN 100 MG PO CAPS: 100.0000 mg | ORAL_CAPSULE | Freq: Two times a day (BID) | ORAL | 3 refills | Status: DC

## 2024-04-30 NOTE — Assessment & Plan Note (Addendum)
 Pins-and-needles sensation in bilateral UE and LE Has tried gabapentin  up to 600 mg TID Still persistent, but improved since switching to Lyrica  On Lyrica  to 100 mg BID and Elavil  25 mg qHS, needs to take Elavil  regularly Tramadol  PRN for severe pain

## 2024-04-30 NOTE — Assessment & Plan Note (Signed)
 Low back pain likely due to sciatica and/or DDD of lumbar spine Takes Tylenol  for more mild to moderate pain and tramadol  as needed for severe pain Heating pad and/or back brace PRN Avoid heavy  lifting and frequent bending X-ray of lumbar spine showed mild DDD Due to persistent pain, will need MRI of lumbar spine Referred to PT

## 2024-04-30 NOTE — Assessment & Plan Note (Signed)
 BP Readings from Last 1 Encounters:  04/30/24 128/83   Well-controlled with losartan  50 mg daily now Counseled for compliance with the medications Advised DASH diet and moderate exercise/walking, at least 150 mins/week

## 2024-04-30 NOTE — Patient Instructions (Addendum)
 Please schedule mammogram.  Please start Doxycycline for tick bite.  You are being scheduled to get MRI of lumbar spine.  Please continue to take medications as prescribed.  Please continue to follow low carb diet and perform moderate exercise/walking as tolerated.
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
## 2024-04-30 NOTE — Assessment & Plan Note (Signed)
 S/p radiation and LAR surgery followed by chemotherapy Followed by oncology and GI

## 2024-04-30 NOTE — Assessment & Plan Note (Signed)
 Started empiric doxycycline Advised to keep area clean and dry

## 2024-04-30 NOTE — Assessment & Plan Note (Addendum)
 Physical exam as documented. Fasting blood tests reviewed. Denies PCV20 vaccine today.

## 2024-05-10 ENCOUNTER — Ambulatory Visit (HOSPITAL_COMMUNITY)
Admission: RE | Admit: 2024-05-10 | Discharge: 2024-05-10 | Disposition: A | Discharge status: Home / Self Care | Source: Ambulatory Visit | Attending: Internal Medicine | Admitting: Internal Medicine

## 2024-05-10 ENCOUNTER — Ambulatory Visit (HOSPITAL_COMMUNITY)
Admission: RE | Admit: 2024-05-10 | Discharge: 2024-05-10 | Disposition: A | Discharge status: Home / Self Care | Source: Ambulatory Visit | Attending: Internal Medicine

## 2024-05-10 ENCOUNTER — Encounter (HOSPITAL_COMMUNITY): Payer: Self-pay

## 2024-05-10 DIAGNOSIS — N63 Unspecified lump in unspecified breast: Secondary | ICD-10-CM | POA: Diagnosis present

## 2024-05-17 ENCOUNTER — Other Ambulatory Visit: Payer: Self-pay | Admitting: Internal Medicine

## 2024-05-17 ENCOUNTER — Other Ambulatory Visit: Payer: Self-pay | Admitting: Hematology

## 2024-05-17 DIAGNOSIS — F419 Anxiety disorder, unspecified: Secondary | ICD-10-CM

## 2024-05-17 DIAGNOSIS — I10 Essential (primary) hypertension: Secondary | ICD-10-CM

## 2024-05-29 ENCOUNTER — Other Ambulatory Visit: Payer: Self-pay

## 2024-05-29 ENCOUNTER — Encounter (HOSPITAL_COMMUNITY): Payer: Self-pay

## 2024-05-29 ENCOUNTER — Ambulatory Visit (HOSPITAL_COMMUNITY): Attending: Internal Medicine

## 2024-05-29 DIAGNOSIS — M5416 Radiculopathy, lumbar region: Secondary | ICD-10-CM | POA: Insufficient documentation

## 2024-05-29 DIAGNOSIS — M5459 Other low back pain: Secondary | ICD-10-CM | POA: Insufficient documentation

## 2024-05-29 DIAGNOSIS — M545 Low back pain, unspecified: Secondary | ICD-10-CM | POA: Insufficient documentation

## 2024-05-29 DIAGNOSIS — G8929 Other chronic pain: Secondary | ICD-10-CM | POA: Insufficient documentation

## 2024-05-29 DIAGNOSIS — Z7409 Other reduced mobility: Secondary | ICD-10-CM | POA: Diagnosis present
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
## 2024-06-04 ENCOUNTER — Telehealth (HOSPITAL_COMMUNITY): Payer: Self-pay

## 2024-06-04 NOTE — Telephone Encounter (Signed)
 Spoke with patient on the phone.  She has had significantly increased pain since her evaluation on 7/8.  When the therapist was pushing on her spine; he hit a tender spot and her pain is such she cannot stand for more than 2 minutes at a time.  Myrick got her working in to be seen on Wed as she has a CT scheduled for tomorrow to see if we can help her with her increased pain levels.   3:27 PM, 06/04/24 Kataleah Bejar Small Korayma Hagwood MPT [facility] [place] [phone]

## 2024-06-05 ENCOUNTER — Inpatient Hospital Stay: Attending: Hematology | Admitting: Oncology

## 2024-06-05 ENCOUNTER — Ambulatory Visit (HOSPITAL_COMMUNITY)

## 2024-06-05 ENCOUNTER — Ambulatory Visit (HOSPITAL_COMMUNITY)
Admission: RE | Admit: 2024-06-05 | Discharge: 2024-06-05 | Disposition: A | Discharge status: Home / Self Care | Source: Ambulatory Visit | Attending: Hematology | Admitting: Hematology

## 2024-06-05 DIAGNOSIS — C20 Malignant neoplasm of rectum: Secondary | ICD-10-CM | POA: Diagnosis present

## 2024-06-05 DIAGNOSIS — Z8041 Family history of malignant neoplasm of ovary: Secondary | ICD-10-CM | POA: Diagnosis not present

## 2024-06-05 DIAGNOSIS — G629 Polyneuropathy, unspecified: Secondary | ICD-10-CM | POA: Insufficient documentation

## 2024-06-05 DIAGNOSIS — Z85038 Personal history of other malignant neoplasm of large intestine: Secondary | ICD-10-CM | POA: Insufficient documentation

## 2024-06-05 DIAGNOSIS — Z79899 Other long term (current) drug therapy: Secondary | ICD-10-CM | POA: Diagnosis not present

## 2024-06-05 DIAGNOSIS — Z8042 Family history of malignant neoplasm of prostate: Secondary | ICD-10-CM | POA: Diagnosis not present

## 2024-06-05 DIAGNOSIS — Z87891 Personal history of nicotine dependence: Secondary | ICD-10-CM | POA: Insufficient documentation

## 2024-06-05 LAB — COMPREHENSIVE METABOLIC PANEL WITH GFR
ALT: 23 U/L (ref 0–44)
AST: 25 U/L (ref 15–41)
Albumin: 4 g/dL (ref 3.5–5.0)
Alkaline Phosphatase: 75 U/L (ref 38–126)
Anion gap: 13 (ref 5–15)
BUN: 17 mg/dL (ref 6–20)
CO2: 23 mmol/L (ref 22–32)
Calcium: 9.3 mg/dL (ref 8.9–10.3)
Chloride: 102 mmol/L (ref 98–111)
Creatinine, Ser: 0.9 mg/dL (ref 0.44–1.00)
GFR, Estimated: >60 mL/min (ref >60)
Glucose, Bld: 109 mg/dL — ABNORMAL HIGH (ref 70–99)
Potassium: 3.9 mmol/L (ref 3.5–5.1)
Sodium: 138 mmol/L (ref 135–145)
Total Bilirubin: 0.5 mg/dL (ref 0.0–1.2)
Total Protein: 7.4 g/dL (ref 6.5–8.1)

## 2024-06-05 LAB — CBC WITH DIFFERENTIAL/PLATELET
Abs Immature Granulocytes: 0.02 K/uL (ref 0.00–0.07)
Basophils Absolute: 0 K/uL (ref 0.0–0.1)
Basophils Relative: 0 %
Eosinophils Absolute: 0.2 K/uL (ref 0.0–0.5)
Eosinophils Relative: 3 %
HCT: 40.3 % (ref 36.0–46.0)
Hemoglobin: 13.5 g/dL (ref 12.0–15.0)
Immature Granulocytes: 0 %
Lymphocytes Relative: 35 %
Lymphs Abs: 2.7 K/uL (ref 0.7–4.0)
MCH: 31.6 pg (ref 26.0–34.0)
MCHC: 33.5 g/dL (ref 30.0–36.0)
MCV: 94.4 fL (ref 80.0–100.0)
Monocytes Absolute: 0.7 K/uL (ref 0.1–1.0)
Monocytes Relative: 10 %
Neutro Abs: 4 K/uL (ref 1.7–7.7)
Neutrophils Relative %: 52 %
Platelets: 260 K/uL (ref 150–400)
RBC: 4.27 MIL/uL (ref 3.87–5.11)
RDW: 12.4 % (ref 11.5–15.5)
WBC: 7.7 K/uL (ref 4.0–10.5)
nRBC: 0 % (ref 0.0–0.2)

## 2024-06-05 MED ORDER — IOHEXOL 300 MG/ML  SOLN
100.0000 mL | Freq: Once | INTRAMUSCULAR | Status: AC | PRN
  Administered 2024-06-05: 100 mL via INTRAVENOUS

## 2024-06-06 ENCOUNTER — Ambulatory Visit (HOSPITAL_COMMUNITY)

## 2024-06-06 DIAGNOSIS — Z7409 Other reduced mobility: Secondary | ICD-10-CM

## 2024-06-06 DIAGNOSIS — M545 Low back pain, unspecified: Secondary | ICD-10-CM

## 2024-06-06 DIAGNOSIS — M5459 Other low back pain: Secondary | ICD-10-CM

## 2024-06-06 LAB — CEA: CEA: 3.1 ng/mL (ref 0.0–4.7)
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
## 2024-06-07 ENCOUNTER — Inpatient Hospital Stay

## 2024-06-14 ENCOUNTER — Other Ambulatory Visit: Payer: Self-pay | Admitting: Internal Medicine

## 2024-06-14 ENCOUNTER — Inpatient Hospital Stay (HOSPITAL_BASED_OUTPATIENT_CLINIC_OR_DEPARTMENT_OTHER): Admitting: Hematology

## 2024-06-14 VITALS — BP 122/91 | HR 98 | Temp 97.6°F | Resp 20 | Wt 152.6 lb

## 2024-06-14 DIAGNOSIS — C20 Malignant neoplasm of rectum: Secondary | ICD-10-CM

## 2024-06-14 DIAGNOSIS — Z85038 Personal history of other malignant neoplasm of large intestine: Secondary | ICD-10-CM | POA: Diagnosis not present

## 2024-06-14 DIAGNOSIS — F419 Anxiety disorder, unspecified: Secondary | ICD-10-CM

## 2024-06-14 NOTE — Patient Instructions (Addendum)
 [facility] at [facility] Discharge Instructions   You were seen and examined today by Dr. Ellin Saba.  He reviewed the results of your lab work which are normal/stable.   He reviewed the results of your CT scan which did not show any evidence of cancer.   We will see you back in 6 months. We will repeat lab work prior to this visit.    Return as scheduled.    Thank you for choosing [facility] at  [facility] to provide your oncology and hematology care.  To afford each patient quality time with our provider, please arrive at least 15 minutes before your scheduled appointment time.   If you have a lab appointment with the Cancer Center please come in thru the Main Entrance and check in at the main information desk.  You need to re-schedule your appointment should you arrive 10 or more minutes late.  We strive to give you quality time with our providers, and arriving late affects you and other patients whose appointments are after yours.  Also, if you no show three or more times for appointments you may be dismissed from the clinic at the providers discretion.     Again, thank you for choosing [facility].  Our hope is that these requests will decrease the amount of time that you wait before being seen by our physicians.       _____________________________________________________________  Should you have questions after your visit to [facility], please contact our office at [phone] and follow the prompts.  Our office hours are 8:00 a.m. and 4:30 p.m. Monday - Friday.  Please note that voicemails left after 4:00 p.m. may not be returned until the following business day.  We are closed weekends and major holidays.  You do have access to a nurse 24-7, just call the main number to the clinic [phone] and do not press any options, hold on the line and a nurse will answer the phone.    For prescription refill  requests, have your pharmacy contact our office and allow 72 hours.    Due to Covid, you will need to wear a mask upon entering the hospital. If you do not have a mask, a mask will be given to you at the Main Entrance upon arrival. For doctor visits, patients may have 1 support person age 19 or older with them. For treatment visits, patients can not have anyone with them due to social distancing guidelines and our immunocompromised population.
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
## 2024-06-26 ENCOUNTER — Encounter (HOSPITAL_COMMUNITY)

## 2024-06-29 ENCOUNTER — Encounter (HOSPITAL_COMMUNITY)

## 2024-07-05 ENCOUNTER — Ambulatory Visit (HOSPITAL_COMMUNITY): Attending: Internal Medicine

## 2024-07-05 ENCOUNTER — Encounter (HOSPITAL_COMMUNITY): Payer: Self-pay

## 2024-07-05 DIAGNOSIS — G8929 Other chronic pain: Secondary | ICD-10-CM | POA: Diagnosis present

## 2024-07-05 DIAGNOSIS — M5459 Other low back pain: Secondary | ICD-10-CM | POA: Diagnosis present

## 2024-07-05 DIAGNOSIS — Z7409 Other reduced mobility: Secondary | ICD-10-CM | POA: Diagnosis present

## 2024-07-05 DIAGNOSIS — M545 Low back pain, unspecified: Secondary | ICD-10-CM | POA: Insufficient documentation
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
## 2024-07-10 ENCOUNTER — Encounter (HOSPITAL_COMMUNITY): Payer: Self-pay

## 2024-07-10 ENCOUNTER — Ambulatory Visit (HOSPITAL_COMMUNITY)

## 2024-07-10 DIAGNOSIS — M5459 Other low back pain: Secondary | ICD-10-CM | POA: Diagnosis not present

## 2024-07-10 DIAGNOSIS — M545 Low back pain, unspecified: Secondary | ICD-10-CM

## 2024-07-10 DIAGNOSIS — Z7409 Other reduced mobility: Secondary | ICD-10-CM
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
## 2024-07-11 ENCOUNTER — Other Ambulatory Visit: Payer: Self-pay | Admitting: Internal Medicine

## 2024-07-11 DIAGNOSIS — F419 Anxiety disorder, unspecified: Secondary | ICD-10-CM

## 2024-07-12 ENCOUNTER — Encounter (HOSPITAL_COMMUNITY)

## 2024-07-14 ENCOUNTER — Other Ambulatory Visit: Payer: Self-pay | Admitting: Internal Medicine

## 2024-07-14 DIAGNOSIS — G609 Hereditary and idiopathic neuropathy, unspecified: Secondary | ICD-10-CM

## 2024-07-17 ENCOUNTER — Ambulatory Visit (HOSPITAL_COMMUNITY)

## 2024-07-17 ENCOUNTER — Encounter (HOSPITAL_COMMUNITY): Payer: Self-pay

## 2024-07-17 DIAGNOSIS — M5459 Other low back pain: Secondary | ICD-10-CM

## 2024-07-17 DIAGNOSIS — Z7409 Other reduced mobility: Secondary | ICD-10-CM

## 2024-07-17 DIAGNOSIS — M545 Low back pain, unspecified: Secondary | ICD-10-CM
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
## 2024-07-19 ENCOUNTER — Ambulatory Visit (HOSPITAL_COMMUNITY)

## 2024-07-24 ENCOUNTER — Encounter (HOSPITAL_COMMUNITY): Payer: Self-pay

## 2024-07-24 ENCOUNTER — Ambulatory Visit (HOSPITAL_COMMUNITY): Attending: Internal Medicine

## 2024-07-24 DIAGNOSIS — M545 Low back pain, unspecified: Secondary | ICD-10-CM | POA: Diagnosis present

## 2024-07-24 DIAGNOSIS — M5459 Other low back pain: Secondary | ICD-10-CM | POA: Insufficient documentation

## 2024-07-24 DIAGNOSIS — G8929 Other chronic pain: Secondary | ICD-10-CM | POA: Diagnosis present

## 2024-07-24 DIAGNOSIS — Z7409 Other reduced mobility: Secondary | ICD-10-CM | POA: Insufficient documentation
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
## 2024-07-26 ENCOUNTER — Ambulatory Visit (HOSPITAL_COMMUNITY)

## 2024-07-26 ENCOUNTER — Encounter (HOSPITAL_COMMUNITY): Payer: Self-pay

## 2024-07-26 ENCOUNTER — Other Ambulatory Visit: Payer: Self-pay | Admitting: Internal Medicine

## 2024-07-26 DIAGNOSIS — F419 Anxiety disorder, unspecified: Secondary | ICD-10-CM

## 2024-07-26 DIAGNOSIS — M5459 Other low back pain: Secondary | ICD-10-CM

## 2024-07-26 DIAGNOSIS — Z7409 Other reduced mobility: Secondary | ICD-10-CM

## 2024-07-26 DIAGNOSIS — G8929 Other chronic pain: Secondary | ICD-10-CM
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
## 2024-07-30 ENCOUNTER — Telehealth (HOSPITAL_COMMUNITY): Payer: Self-pay

## 2024-07-30 ENCOUNTER — Ambulatory Visit (HOSPITAL_COMMUNITY)

## 2024-07-30 NOTE — Telephone Encounter (Signed)
 No Show #1 Called patient concerning missed appointment this date. Left message on voicemail with reminder of next scheduled visit on 9/11 at 4 pm.  4:31 PM, 07/30/24 Rosaria Settler, PT, DPT [facility] [place]

## 2024-08-02 ENCOUNTER — Ambulatory Visit (HOSPITAL_COMMUNITY)

## 2024-08-02 ENCOUNTER — Encounter (HOSPITAL_COMMUNITY): Payer: Self-pay

## 2024-08-02 DIAGNOSIS — M5459 Other low back pain: Secondary | ICD-10-CM | POA: Diagnosis not present

## 2024-08-02 DIAGNOSIS — Z7409 Other reduced mobility: Secondary | ICD-10-CM

## 2024-08-02 DIAGNOSIS — G8929 Other chronic pain: Secondary | ICD-10-CM
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
## 2024-08-06 ENCOUNTER — Encounter (HOSPITAL_COMMUNITY)

## 2024-08-09 ENCOUNTER — Encounter (HOSPITAL_COMMUNITY)

## 2024-08-14 ENCOUNTER — Encounter: Payer: Self-pay | Admitting: Dermatology

## 2024-08-14 ENCOUNTER — Ambulatory Visit: Payer: Medicaid Other | Admitting: Dermatology

## 2024-08-14 VITALS — BP 120/78 | HR 97

## 2024-08-14 DIAGNOSIS — D485 Neoplasm of uncertain behavior of skin: Secondary | ICD-10-CM

## 2024-08-14 DIAGNOSIS — L300 Nummular dermatitis: Secondary | ICD-10-CM

## 2024-08-14 DIAGNOSIS — L209 Atopic dermatitis, unspecified: Secondary | ICD-10-CM | POA: Diagnosis not present

## 2024-08-14 DIAGNOSIS — L570 Actinic keratosis: Secondary | ICD-10-CM

## 2024-08-14 DIAGNOSIS — D0471 Carcinoma in situ of skin of right lower limb, including hip: Secondary | ICD-10-CM

## 2024-08-14 DIAGNOSIS — D492 Neoplasm of unspecified behavior of bone, soft tissue, and skin: Secondary | ICD-10-CM | POA: Diagnosis not present

## 2024-08-14 MED ORDER — TACROLIMUS 0.1 % EX OINT: TOPICAL_OINTMENT | Freq: Two times a day (BID) | CUTANEOUS | 6 refills | Status: DC

## 2024-08-14 MED ORDER — MOMETASONE FUROATE 0.1 % EX CREA: 1.0000 | TOPICAL_CREAM | Freq: Two times a day (BID) | CUTANEOUS | 6 refills | Status: DC

## 2024-08-14 NOTE — Addendum Note (Signed)
 Addended by: Lyniah Fujita J on: 08/14/2024 03:57 PM   Modules accepted: Orders

## 2024-08-14 NOTE — Progress Notes (Signed)
 New Patient Visit   Subjective  Alexandra Bright is a 58 y.o. female who presents for the following: eczema  Patient states she has irritation located at the lower legs that she would like to have examined. Patient reports the areas have been there for several years. She reports the areas are bothersome. Patient reports the areas can be hot, itchy and irritation if she wears tight clothing. Patient rates irritation 6 out of 10. She states that the areas have not spread. Patient reports she has previously been treated for these areas. Patient has previously been prescribed clobetasol  0.05%, Mupirocin 2% and Triamcinolone  0.1%. Patient reports these medications were ineffective. She has not used any of these medications since last summer. Patient reports teatree oil has helped her more than prescription creams. Patient reports Hx of bx. 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 Patient has no family history of skin cancer(s).   The following portions of the chart were reviewed this encounter and updated as appropriate: medications, allergies, medical history  Review of Systems:  No other skin or systemic complaints except as noted in HPI or Assessment and Plan.  Objective  Well appearing patient in no apparent distress; mood and affect are within normal limits.  A focused examination was performed of the following areas: B/L lower legs  Relevant exam findings are noted in the Assessment and Plan.         Right Lower Leg - Anterior 5 mm crusted papule Left Forearm - Posterior, Left Upper Arm - Posterior, Left Upper Back, Right Forearm - Posterior, Right Upper Arm - Posterior, Right Upper Back Erythematous thin papules/macules with gritty scale.   Assessment & Plan   Nummular eczema Chronic nummular eczema on legs with dryness and flakiness. Previous treatments ineffective. - Use CeraVe anti-itch lotion daily post-shower. - Start mometasone  twice daily for two weeks during flare-ups. - Switch to  tacrolimus  for two weeks post-mometasone . - Consider Dupixent if current treatments fail and insurance criteria met.  Crusted papule, suspicious for skin cancer 5 mm crusted papule on right medial calf, suspicious for skin cancer. - Biopsy crusted papule on right medial calf. - Apply Vaseline or Aquaphor to biopsy site post-shower. - Send biopsy results via MyChart.  Actinic keratosis Pink gritty papules on forearms consistent with actinic keratosis. - Cryotherapy with liquid nitrogen on lesions on bilateral forearms.  Pt is not a candidate for methotrexate or cyclosporine due to inability for follow up labs and contraindications with other medications. Pt has no access to a light box for phototherapy.  Due to the progressive and chronic nature of her Atopic Derm, patient has tried and failed numerous topicals creams as noted above, the next best therapeutic option is a systemic therapy. It is medically necessary to help improve her quality of life.     NEOPLASM OF UNCERTAIN BEHAVIOR OF SKIN Right Lower Leg - Anterior Epidermal / dermal shaving  Lesion diameter (cm):  0.5 Informed consent: discussed and consent obtained   Timeout: patient name, date of birth, surgical site, and procedure verified   Procedure prep:  Patient was prepped and draped in usual sterile fashion Prep type:  Isopropyl alcohol Anesthesia: the lesion was anesthetized in a standard fashion   Anesthetic:  1% lidocaine  w/ epinephrine  1-100,000 buffered w/ 8.4% NaHCO3 Instrument used: DermaBlade   Hemostasis achieved with: aluminum chloride   Outcome: patient tolerated procedure well   Post-procedure details: sterile dressing applied and wound care instructions given   Dressing type: petrolatum  Additional details:  Pt aware that benign results will be sent to mychart and the staff will call abnormal results will  Specimen 1 - Surgical pathology Differential Diagnosis: r/o SCC  Check Margins: No AK  (ACTINIC KERATOSIS) (6) Left Forearm - Posterior, Left Upper Arm - Posterior, Left Upper Back, Right Forearm - Posterior, Right Upper Arm - Posterior, Right Upper Back Destruction of lesion - Left Forearm - Posterior, Left Upper Arm - Posterior, Left Upper Back, Right Forearm - Posterior, Right Upper Arm - Posterior, Right Upper Back Complexity: simple   Destruction method: cryotherapy   Informed consent: discussed and consent obtained   Timeout:  patient name, date of birth, surgical site, and procedure verified Lesion destroyed using liquid nitrogen: Yes   Cryotherapy cycles:  7 Outcome: patient tolerated procedure well with no complications   Post-procedure details: wound care instructions given    ATOPIC DERMATITIS, UNSPECIFIED TYPE   Related Medications mometasone  (ELOCON ) 0.1 % cream Apply 1 Application topically 2 (two) times daily. Apply medication to affected area. Apply 2 times daily for 2 weeks then STOP after 2 weeks. tacrolimus  (PROTOPIC ) 0.1 % ointment Apply topically 2 (two) times daily. Apply medication to affected area. Apply 2 times daily for 2 weeks when taking a break from memotasone then STOP after 2 weeks.  Return in about 5 months (around 01/14/2025) for TBSE & Eczema F/U.  [name], am acting as Neurosurgeon for [employer], DO.  Documentation: I have reviewed the above documentation for accuracy and completeness, and I agree with the above.  Delon Lenis, DO

## 2024-08-14 NOTE — Patient Instructions (Addendum)
 VISIT SUMMARY:  Today, we addressed your persistent skin lesions and itching. We discussed your eczema, a suspicious lesion on your calf, and some rough spots on your forearms.  YOUR PLAN:  -NUMMULAR ECZEMA:  Nummular eczema is a chronic skin condition that causes dry, flaky, and itchy patches on the skin. You should use CeraVe anti-itch lotion daily after showering. During flare-ups, apply mometasone  twice daily for two weeks, then switch to tacrolimus  for another two weeks. If these treatments do not work and [employer] covers it, we may consider starting Dupixent.  -CRUSTED PAPULE, SUSPICIOUS FOR SKIN CANCER:  A crusted papule on your right calf may be skin cancer. We will perform a biopsy to determine this. After the biopsy, apply Vaseline or Aquaphor to the site after showering. We will send the biopsy results to you via MyChart.  -ACTINIC KERATOSIS:  Actinic keratosis is a rough, scaly patch on the skin caused by years of sun exposure. We treated the lesions on your forearms with cryotherapy using liquid nitrogen.  INSTRUCTIONS:  We will follow up with you regarding the biopsy results through MyChart. Please continue with the prescribed treatments and contact us  if you have any concerns or if your symptoms do not improve.        Patient Handout: Wound Care for Skin Biopsy Site  Taking Care of Your Skin Biopsy Site  Proper care of the biopsy site is essential for promoting healing and minimizing scarring. This handout provides instructions on how to care for your biopsy site to ensure optimal recovery.  1. Cleaning the Wound:  Clean the biopsy site daily with gentle soap and water . Gently pat the area dry with a clean, soft towel. Avoid harsh scrubbing or rubbing the area, as this can irritate the skin and delay healing.  2. Applying Aquaphor and Bandage:  After cleaning the wound, apply a thin layer of Aquaphor ointment to the biopsy site. Cover the area with a  sterile bandage to protect it from dirt, bacteria, and friction. Change the bandage daily or as needed if it becomes soiled or wet.  3. Continued Care for One Week:  Repeat the cleaning, Aquaphor application, and bandaging process daily for one week following the biopsy procedure. Keeping the wound clean and moist during this initial healing period will help prevent infection and promote optimal healing.  4. Massaging Aquaphor into the Area:  ---After one week, discontinue the use of bandages but continue to apply Aquaphor to the biopsy site. ----Gently massage the Aquaphor into the area using circular motions. ---Massaging the skin helps to promote circulation and prevent the formation of scar tissue.   Additional Tips:  Avoid exposing the biopsy site to direct sunlight during the healing process, as this can cause hyperpigmentation or worsen scarring. If you experience any signs of infection, such as increased redness, swelling, warmth, or drainage from the wound, contact your healthcare provider immediately. Follow any additional instructions provided by your healthcare provider for caring for the biopsy site and managing any discomfort. Conclusion:  Taking proper care of your skin biopsy site is crucial for ensuring optimal healing and minimizing scarring. By following these instructions for cleaning, applying Aquaphor, and massaging the area, you can promote a smooth and successful recovery. If you have any questions or concerns about caring for your biopsy site, don't hesitate to contact your healthcare provider for guidance.    Important Information   Due to recent changes in healthcare laws, you may see results of your pathology  and/or laboratory studies on MyChart before the doctors have had a chance to review them. We understand that in some cases there may be results that are confusing or concerning to you. Please understand that not all results are received at the same time and  often the doctors may need to interpret multiple results in order to provide you with the best plan of care or course of treatment. Therefore, we ask that you please give us  2 business days to thoroughly review all your results before contacting the office for clarification. Should we see a critical lab result, you will be contacted sooner.     If You Need Anything After Your Visit   If you have any questions or concerns for your doctor, please call our main line at [phone]. If no one answers, please leave a voicemail as directed and we will return your call as soon as possible. Messages left after 4 pm will be answered the following business day.    You may also send us  a message via MyChart. We typically respond to MyChart messages within 1-2 business days.  For prescription refills, please ask your pharmacy to contact our office. Our fax number is [phone].  If you have an urgent issue when the clinic is closed that cannot wait until the next business day, you can page your doctor at the number below.     Please note that while we do our best to be available for urgent issues outside of office hours, we are not available 24/7.    If you have an urgent issue and are unable to reach us , you may choose to seek medical care at your doctor's office, retail clinic, urgent care center, or emergency room.   If you have a medical emergency, please immediately call 911 or go to the emergency department. In the event of inclement weather, please call our main line at [phone] for an update on the status of any delays or closures.  Dermatology Medication Tips: Please keep the boxes that topical medications come in in order to help keep track of the instructions about where and how to use these. Pharmacies typically print the medication instructions only on the boxes and not directly on the medication tubes.   If your medication is too expensive, please contact our office at [phone] or  send us  a message through MyChart.    We are unable to tell what your co-pay for medications will be in advance as this is different depending on your insurance coverage. However, we may be able to find a substitute medication at lower cost or fill out paperwork to get insurance to cover a needed medication.    If a prior authorization is required to get your medication covered by your insurance company, please allow us  1-2 business days to complete this process.   Drug prices often vary depending on where the prescription is filled and some pharmacies may offer cheaper prices.   The website www.goodrx.com contains coupons for medications through different pharmacies. The prices here do not account for what the cost may be with help from insurance (it may be cheaper with your insurance), but the website can give you the price if you did not use any insurance.  - You can print the associated coupon and take it with your prescription to the pharmacy.  - You may also stop by our office during regular business hours and pick up a GoodRx coupon card.  - If you need your prescription sent  electronically to a different pharmacy, notify our office through [facility] or by phone at [phone]

## 2024-08-15 ENCOUNTER — Encounter (HOSPITAL_COMMUNITY)

## 2024-08-15 LAB — SURGICAL PATHOLOGY

## 2024-08-16 ENCOUNTER — Ambulatory Visit: Payer: Self-pay | Admitting: Dermatology

## 2024-08-16 ENCOUNTER — Encounter: Payer: Self-pay | Admitting: Dermatology

## 2024-08-16 DIAGNOSIS — D099 Carcinoma in situ, unspecified: Secondary | ICD-10-CM | POA: Insufficient documentation

## 2024-08-16 NOTE — Progress Notes (Signed)
 Hi Alexandra Bright, Please call pt and notify their bx results were positive for a skin CA that will be treated by Dr. Alm w/ [facility]  1. Skin, right lower leg - anterior :       SQUAMOUS CELL CARCINOMA IN SITU

## 2024-08-30 ENCOUNTER — Encounter: Payer: Self-pay | Admitting: Internal Medicine

## 2024-08-30 ENCOUNTER — Ambulatory Visit (INDEPENDENT_AMBULATORY_CARE_PROVIDER_SITE_OTHER): Admitting: Internal Medicine

## 2024-08-30 VITALS — BP 113/78 | HR 81 | Ht 61.0 in | Wt 152.2 lb

## 2024-08-30 DIAGNOSIS — G609 Hereditary and idiopathic neuropathy, unspecified: Secondary | ICD-10-CM

## 2024-08-30 DIAGNOSIS — F419 Anxiety disorder, unspecified: Secondary | ICD-10-CM

## 2024-08-30 DIAGNOSIS — C189 Malignant neoplasm of colon, unspecified: Secondary | ICD-10-CM | POA: Diagnosis not present

## 2024-08-30 DIAGNOSIS — M5416 Radiculopathy, lumbar region: Secondary | ICD-10-CM

## 2024-08-30 DIAGNOSIS — E782 Mixed hyperlipidemia: Secondary | ICD-10-CM

## 2024-08-30 DIAGNOSIS — I10 Essential (primary) hypertension: Secondary | ICD-10-CM | POA: Diagnosis not present

## 2024-08-30 DIAGNOSIS — F331 Major depressive disorder, recurrent, moderate: Secondary | ICD-10-CM

## 2024-08-30 MED ORDER — PREGABALIN 100 MG PO CAPS: 100.0000 mg | ORAL_CAPSULE | Freq: Two times a day (BID) | ORAL | 3 refills | Status: AC

## 2024-08-30 NOTE — Assessment & Plan Note (Signed)
 Low back pain likely due to sciatica and/or DDD of lumbar spine Takes Tylenol  for more mild to moderate pain and tramadol  as needed for severe pain Heating pad and/or back brace PRN Avoid heavy  lifting and frequent bending X-ray of lumbar spine showed mild DDD Completed PT for 2 months Due to persistent pain, will need MRI of lumbar spine

## 2024-08-30 NOTE — Assessment & Plan Note (Signed)
 S/p radiation and LAR surgery followed by chemotherapy Followed by oncology and GI
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
## 2024-08-30 NOTE — Assessment & Plan Note (Addendum)
 BP Readings from Last 1 Encounters:  08/30/24 113/78   Well-controlled with losartan  50 mg QD Counseled for compliance with the medications Advised DASH diet and moderate exercise/walking, at least 150 mins/week
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
## 2024-08-30 NOTE — Assessment & Plan Note (Signed)
 Last lipid profile showed elevated TG Check lipid profile in fasting state Advised to follow DASH diet

## 2024-08-30 NOTE — Assessment & Plan Note (Addendum)
 Uncontrolled, but improving However domestic situation is contributing to worsening of anxiety, but recently better On Wellbutrin  200 mg twice daily for now On BuSpar  10 mg twice daily On Elavil  for insomnia and peripheral neuropathy, but she has stopped taking it, was prescribed by Oncology Did not tolerate SSRI in the past Had referred to [facility] therapy

## 2024-08-30 NOTE — Assessment & Plan Note (Addendum)
Pins-and-needles sensation in bilateral UE and LE Has tried gabapentin up to 600 mg TID Still persistent, but improved since switching to Lyrica On Lyrica to 100 mg BID and Elavil 25 mg qHS Tramadol PRN for severe pain

## 2024-08-30 NOTE — Patient Instructions (Signed)
Please continue to take medications as prescribed.  Please continue to follow low salt diet and perform moderate exercise/walking as tolerated.  Please get fasting blood tests done before the next visit.

## 2024-09-05 ENCOUNTER — Encounter (INDEPENDENT_AMBULATORY_CARE_PROVIDER_SITE_OTHER): Payer: Self-pay | Admitting: Gastroenterology

## 2024-09-11 ENCOUNTER — Telehealth: Payer: Self-pay

## 2024-09-11 NOTE — Telephone Encounter (Signed)
 Copied from CRM [id number]. Topic: Clinical - Request for Lab/Test Order >> Sep 11, 2024  2:20 PM Tinnie C wrote: Reason for CRM: Pts MRI was denied by insurance (It is scheduled for 10/27). She called her insurance and they said we might have used an incorrect code to get prior auth. Per pt, insurance says to use code [id number]. She says if you have any questions on getting it approved, you may reach out to the prescriber line at [phone].

## 2024-09-12 ENCOUNTER — Encounter: Payer: Self-pay | Admitting: Dermatology

## 2024-09-12 ENCOUNTER — Ambulatory Visit (INDEPENDENT_AMBULATORY_CARE_PROVIDER_SITE_OTHER): Admitting: Dermatology

## 2024-09-12 VITALS — BP 126/85

## 2024-09-12 DIAGNOSIS — D0471 Carcinoma in situ of skin of right lower limb, including hip: Secondary | ICD-10-CM | POA: Diagnosis not present

## 2024-09-12 DIAGNOSIS — D099 Carcinoma in situ, unspecified: Secondary | ICD-10-CM

## 2024-09-12 NOTE — Progress Notes (Signed)
   Follow-Up Visit   Subjective  Alexandra Bright is a 58 y.o. female established patient who presents for FOLLOW UP on the diagnoses listed below:  Patient was last evaluated on 08/14/24.   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 No   The following portions of the chart were reviewed this encounter and updated as appropriate: medications, allergies, medical history  Review of Systems:  No other skin or systemic complaints except as noted in HPI or Assessment and Plan.  Objective  Well appearing patient in no apparent distress; mood and affect are within normal limits.   A focused examination was performed of the following areas: R lower leg   Relevant exam findings are noted in the Assessment and Plan.      Right Lower Leg - Anterior Pink scar in area of recent biopsy  Assessment & Plan  . 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 Destruction of lesion Complexity: extensive   Destruction method: electrodesiccation and curettage   Informed consent: discussed and consent obtained   Timeout:  patient name, date of birth, surgical site, and procedure verified Procedure prep:  Patient was prepped and draped in usual sterile fashion Prep type:  Isopropyl alcohol Anesthesia: the lesion was anesthetized in a standard fashion   Anesthetic:  1% lidocaine  w/ epinephrine  1-100,000 buffered w/ 8.4% NaHCO3 Curettage performed in three different directions: Yes   Electrodesiccation performed over the curetted area: Yes   Lesion length (cm):  0.4 Margin per side (cm):  0.3 Hemostasis achieved with:  pressure, aluminum chloride and electrodesiccation Outcome: patient tolerated procedure well with no complications   Post-procedure details: sterile dressing applied and wound care instructions given   Dressing type: bandage and petrolatum     No follow-ups on file.   Documentation: I have  reviewed the above documentation for accuracy and completeness, and I agree with the above.  I, Shirron Maranda, CMA, am acting as scribe for [employer], DO.   Delon Lenis, DO

## 2024-09-12 NOTE — Telephone Encounter (Signed)
 Not sure what the # Patient gave but it is not a CPT Code.  Provider may do a Peer to Peer to see if he can override decision.

## 2024-09-12 NOTE — Patient Instructions (Addendum)

## 2024-09-15 ENCOUNTER — Other Ambulatory Visit: Payer: Self-pay | Admitting: Internal Medicine

## 2024-09-15 DIAGNOSIS — G609 Hereditary and idiopathic neuropathy, unspecified: Secondary | ICD-10-CM

## 2024-09-17 ENCOUNTER — Ambulatory Visit (HOSPITAL_COMMUNITY)
Admission: RE | Admit: 2024-09-17 | Discharge: 2024-09-17 | Disposition: A | Discharge status: Home / Self Care | Source: Ambulatory Visit | Attending: Internal Medicine | Admitting: Internal Medicine

## 2024-09-17 DIAGNOSIS — M5416 Radiculopathy, lumbar region: Secondary | ICD-10-CM | POA: Insufficient documentation

## 2024-09-20 ENCOUNTER — Ambulatory Visit: Payer: Self-pay | Admitting: Internal Medicine

## 2024-09-20 ENCOUNTER — Other Ambulatory Visit: Payer: Self-pay | Admitting: Internal Medicine

## 2024-09-20 DIAGNOSIS — S3210XA Unspecified fracture of sacrum, initial encounter for closed fracture: Secondary | ICD-10-CM | POA: Insufficient documentation

## 2024-09-20 DIAGNOSIS — M5416 Radiculopathy, lumbar region: Secondary | ICD-10-CM

## 2024-10-10 ENCOUNTER — Other Ambulatory Visit: Payer: Self-pay | Admitting: Internal Medicine

## 2024-10-10 DIAGNOSIS — I10 Essential (primary) hypertension: Secondary | ICD-10-CM

## 2024-10-31 ENCOUNTER — Other Ambulatory Visit: Payer: Self-pay | Admitting: Internal Medicine

## 2024-10-31 DIAGNOSIS — M5431 Sciatica, right side: Secondary | ICD-10-CM

## 2024-11-01 ENCOUNTER — Other Ambulatory Visit: Payer: Self-pay

## 2024-11-01 ENCOUNTER — Encounter: Payer: Self-pay | Admitting: Orthopedic Surgery

## 2024-11-01 ENCOUNTER — Ambulatory Visit: Admitting: Orthopedic Surgery

## 2024-11-01 DIAGNOSIS — G8929 Other chronic pain: Secondary | ICD-10-CM

## 2024-11-01 DIAGNOSIS — M545 Low back pain, unspecified: Secondary | ICD-10-CM | POA: Diagnosis not present

## 2024-11-02 NOTE — Progress Notes (Signed)
 Orthopedic Spine Surgery Office Note  Assessment: Patient is a 58 y.o. female with low back pain in the lower lumbar region. No radicular symptoms. Has significant facet arthropathy at L4/5 and L5/S1   Plan: -Explained that initially conservative treatment is tried as a significant number of patients may experience relief with these treatment modalities. Discussed that the conservative treatments include:  -activity modification  -physical therapy  -over the counter pain medications  -medrol  dosepak  -lumbar steroid injections -Patient has tried PT, tylenol , flexeril , lyrica , tramadol  -Recommended diagnostic/therapeutic facet injections. If those provide her with relief, she may be a good candidate for RFA in the future -Patient should return to office in 5-6 weeks, x-rays at next visit: none   Patient expressed understanding of the plan and all questions were answered to the patient's satisfaction.   ___________________________________________________________________________   History:  Patient is a 58 y.o. female who presents today for lumbar spine.  Patient has had over a year of lower back pain that has gotten progressively worse with time.  She notes it mostly on the left side of her lower lumbar region.  She has no pain radiating there was no trauma or injury that preceded the onset of the pain.  She said it got significantly worse after she was in therapy and the therapist pushed on her back.  She notices most difficulty if she has to squat to get back up.  But she also notices when standing for longer than a few minutes.  She does not have the pain if she is sitting down or leaning forward.  No bowel or bladder incontinence.  No saddle anesthesia.  Treatments tried: PT, tylenol , flexeril , lyrica , tramadol   Review of systems: Denies fevers and chills, night sweats, unexplained weight loss. Has had pain that wakes her at night. Has a history of colon cancer  Past medical  history: History of colon cancer HTN Anxiety Chronic pain  Allergies: NKDA  Past surgical history:  Polypectomy Facial fracture ORIF Bowel resection  Social history: Denies use of nicotine product (smoking, vaping, patches, smokeless) Alcohol use: denies Denies recreational drug use   Physical Exam:  BMI of 28.9  General: no acute distress, appears stated age Neurologic: alert, answering questions appropriately, following commands Respiratory: unlabored breathing on room air, symmetric chest rise Psychiatric: appropriate affect, normal cadence to speech   MSK (spine):  -Strength exam      Left  Right EHL    5/5  5/5 TA    5/5  5/5 GSC    5/5  5/5 Knee extension  5/5  5/5 Hip flexion   5/5  5/5  -Sensory exam    Sensation intact to light touch in L3-S1 nerve distributions of bilateral lower extremities  -Achilles DTR: 2/4 on the left, 2/4 on the right -Patellar tendon DTR: 2/4 on the left, 2/4 on the right  -Straight leg raise: negative bilaterally -Clonus: no beats bilaterally  -Left hip exam: no pain through range of motion -Right hip exam: no pain through range of motion  Imaging: XRs of the lumbar spine from 11/01/2024 were independently reviewed and interpreted, showing lumbar scoliotic curvature with apex to the left at L2/3.  Disc height loss at L3/4.  No fracture or dislocation seen.  No evidence of instability in flexion/extension views.  MRI of the lumbar spine from 09/17/2024 was independently reviewed and interpreted, showing left-sided foraminal stenosis at L4/5.  Significant facet arthrosis at L4/5 and L5/S1.   Patient name: Alexandra Bright Patient  MRN: [id number] Date of visit: 11/01/2024

## 2024-11-16 ENCOUNTER — Ambulatory Visit: Admitting: Physical Medicine and Rehabilitation

## 2024-11-23 ENCOUNTER — Other Ambulatory Visit: Payer: Self-pay | Admitting: Internal Medicine

## 2024-11-23 ENCOUNTER — Ambulatory Visit: Admitting: Physical Medicine and Rehabilitation

## 2024-11-23 ENCOUNTER — Telehealth: Payer: Self-pay

## 2024-11-23 ENCOUNTER — Encounter: Payer: Self-pay | Admitting: Physical Medicine and Rehabilitation

## 2024-11-23 DIAGNOSIS — M545 Low back pain, unspecified: Secondary | ICD-10-CM

## 2024-11-23 DIAGNOSIS — M47816 Spondylosis without myelopathy or radiculopathy, lumbar region: Secondary | ICD-10-CM

## 2024-11-23 DIAGNOSIS — G8929 Other chronic pain: Secondary | ICD-10-CM

## 2024-11-23 DIAGNOSIS — M47819 Spondylosis without myelopathy or radiculopathy, site unspecified: Secondary | ICD-10-CM

## 2024-11-23 DIAGNOSIS — Z122 Encounter for screening for malignant neoplasm of respiratory organs: Secondary | ICD-10-CM
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
## 2024-11-23 NOTE — Telephone Encounter (Signed)
 Copied from CRM [id number]. Topic: Clinical - Request for Lab/Test Order >> Nov 23, 2024 11:32 AM Drema MATSU wrote: Reason for CRM: Patient is needing another order for an xray that pcp wanted her to do last time. (take a picture of lungs)

## 2024-11-23 NOTE — Progress Notes (Signed)
 Pain Scale   Average Pain 4 Patient advising she has lower back pain radiating bilaterally to hips and now left side groin. Patient had MRI done 10/25        +Driver, -BT, -Dye Allergies.

## 2024-11-26 NOTE — Telephone Encounter (Signed)
 Left detailed vm letting pt know she will get a call soon for scheduling, if no call is received she should contact our office.

## 2024-12-04 ENCOUNTER — Encounter: Payer: Self-pay | Admitting: Adult Health

## 2024-12-04 ENCOUNTER — Other Ambulatory Visit (HOSPITAL_COMMUNITY)
Admission: RE | Admit: 2024-12-04 | Discharge: 2024-12-04 | Disposition: A | Source: Ambulatory Visit | Attending: Adult Health | Admitting: Adult Health

## 2024-12-04 ENCOUNTER — Ambulatory Visit: Admitting: Adult Health

## 2024-12-04 VITALS — BP 129/84 | HR 87 | Ht 61.0 in | Wt 162.5 lb

## 2024-12-04 DIAGNOSIS — Z8742 Personal history of other diseases of the female genital tract: Secondary | ICD-10-CM | POA: Diagnosis not present

## 2024-12-04 DIAGNOSIS — N393 Stress incontinence (female) (male): Secondary | ICD-10-CM | POA: Insufficient documentation

## 2024-12-04 DIAGNOSIS — R1032 Left lower quadrant pain: Secondary | ICD-10-CM | POA: Insufficient documentation

## 2024-12-04 DIAGNOSIS — N898 Other specified noninflammatory disorders of vagina: Secondary | ICD-10-CM | POA: Diagnosis not present

## 2024-12-04 DIAGNOSIS — Z124 Encounter for screening for malignant neoplasm of cervix: Secondary | ICD-10-CM | POA: Insufficient documentation

## 2024-12-04 DIAGNOSIS — R319 Hematuria, unspecified: Secondary | ICD-10-CM | POA: Diagnosis not present

## 2024-12-04 LAB — POCT URINALYSIS DIPSTICK
Glucose, UA: NEGATIVE
Ketones, UA: NEGATIVE
Leukocytes, UA: NEGATIVE
Nitrite, UA: NEGATIVE
Protein, UA: NEGATIVE

## 2024-12-04 NOTE — Progress Notes (Signed)
" °  Subjective:     Patient ID: Alexandra Bright, female   DOB: 05-10-1966, 59 y.o.   MRN: [id number]  HPI Alexandra Bright is a 59 year old white female, married. PM in complaining of LLQ pain for about a week, noticed after carried grand daughter, and  has noticed that is leaking urine and has vaginal odor. And she needs a pap     Component Value Date/Time   DIAGPAP (A) 01/25/2022 1021    - Atypical squamous cells of undetermined significance (ASC-US )   HPVHIGH Negative 01/25/2022 1021   ADEQPAP  01/25/2022 1021    Satisfactory for evaluation; transformation zone component PRESENT.    PCP is Dr Tobie Review of Systems +LLQ pain for about a week, noticed after carried grand daughter, +noticed that is leaking urine, esp if picks something up  +vaginal odor. Not having sex Reviewed past medical,surgical, social and family history. Reviewed medications and allergies.     Objective:   Physical Exam BP 129/84 (BP Location: Right Arm, Patient Position: Sitting, Cuff Size: Normal)   Pulse 87   Ht 5' 1 (1.549 m)   Wt 162 lb 8 oz (73.7 kg)   BMI 30.70 kg/m  urine dipstick trace blood Skin warm and dry.Pelvic: external genitalia is normal in appearance no lesions, vagina: pale, no odor, urethra has no lesions or masses noted, cervix:smooth, pap with GC/CHL and HR HPV genotyping performed, uterus: normal size, shape and contour, non tender, no masses felt, adnexa: no masses, +LLQ tenderness noted. Bladder is non tender and no masses felt.   Fall risk is low  Upstream - 12/04/24 1219       Pregnancy Intention Screening   Does the patient want to become pregnant in the next year? N/A    Does the patient's partner want to become pregnant in the next year? N/A    Would the patient like to discuss contraceptive options today? N/A      Contraception Wrap Up   Current Method Abstinence;Post-Menopause    End Method Abstinence;Post-Menopause    Contraception Counseling Provided No         Examination  chaperoned by Clarita Salt LPN  Assessment:     1. Stress incontinence of urine +SUI,  UA C& sent  - POCT Urinalysis Dipstick - Urine Culture - Urinalysis, Routine w reflex microscopic  2. Hematuria, unspecified type UA C&S sent to rule out UTI  - POCT Urinalysis Dipstick - Urine Culture - Urinalysis, Routine w reflex microscopic  3. Routine Papanicolaou smear Pap sent  - Cytology - PAP( [facility])  4. History of abnormal cervical Pap smear Pap sent   5. LLQ pain (Primary) +LLQ pain for about a week Will get pelvic US  in office 12/12/24 at 8:30 am to assess   6. Vaginal odor No odor today      Plan:     Return in 8 days for pelvic US      "

## 2024-12-05 LAB — URINALYSIS, ROUTINE W REFLEX MICROSCOPIC
Bilirubin, UA: NEGATIVE
Glucose, UA: NEGATIVE
Ketones, UA: NEGATIVE
Leukocytes,UA: NEGATIVE
Nitrite, UA: NEGATIVE
Protein,UA: NEGATIVE
RBC, UA: NEGATIVE
Specific Gravity, UA: 1.029 (ref 1.005–1.030)
Urobilinogen, Ur: 0.2 mg/dL (ref 0.2–1.0)
pH, UA: 5 (ref 5.0–7.5)

## 2024-12-06 ENCOUNTER — Ambulatory Visit: Payer: Self-pay | Admitting: Adult Health

## 2024-12-06 DIAGNOSIS — R87610 Atypical squamous cells of undetermined significance on cytologic smear of cervix (ASC-US): Secondary | ICD-10-CM

## 2024-12-06 LAB — URINE CULTURE

## 2024-12-07 LAB — CYTOLOGY - PAP
Chlamydia: NEGATIVE
Comment: NEGATIVE
Comment: NEGATIVE
Comment: NEGATIVE
Comment: NEGATIVE
Comment: NORMAL
Diagnosis: UNDETERMINED — AB
HPV 16: NEGATIVE
HPV 18 / 45: NEGATIVE
High risk HPV: POSITIVE — AB
Neisseria Gonorrhea: NEGATIVE

## 2024-12-10 DIAGNOSIS — R87610 Atypical squamous cells of undetermined significance on cytologic smear of cervix (ASC-US): Secondary | ICD-10-CM | POA: Insufficient documentation

## 2024-12-11 ENCOUNTER — Inpatient Hospital Stay: Attending: Oncology

## 2024-12-11 DIAGNOSIS — Z8041 Family history of malignant neoplasm of ovary: Secondary | ICD-10-CM | POA: Insufficient documentation

## 2024-12-11 DIAGNOSIS — Z87891 Personal history of nicotine dependence: Secondary | ICD-10-CM | POA: Diagnosis not present

## 2024-12-11 DIAGNOSIS — Z8042 Family history of malignant neoplasm of prostate: Secondary | ICD-10-CM | POA: Insufficient documentation

## 2024-12-11 DIAGNOSIS — Z79899 Other long term (current) drug therapy: Secondary | ICD-10-CM | POA: Diagnosis not present

## 2024-12-11 DIAGNOSIS — C20 Malignant neoplasm of rectum: Secondary | ICD-10-CM

## 2024-12-11 DIAGNOSIS — G629 Polyneuropathy, unspecified: Secondary | ICD-10-CM | POA: Diagnosis not present

## 2024-12-11 DIAGNOSIS — Z85038 Personal history of other malignant neoplasm of large intestine: Secondary | ICD-10-CM | POA: Insufficient documentation

## 2024-12-11 LAB — COMPREHENSIVE METABOLIC PANEL WITH GFR
ALT: 21 U/L (ref 0–44)
AST: 25 U/L (ref 15–41)
Albumin: 4.6 g/dL (ref 3.5–5.0)
Alkaline Phosphatase: 93 U/L (ref 38–126)
Anion gap: 15 (ref 5–15)
BUN: 16 mg/dL (ref 6–20)
CO2: 23 mmol/L (ref 22–32)
Calcium: 10 mg/dL (ref 8.9–10.3)
Chloride: 101 mmol/L (ref 98–111)
Creatinine, Ser: 0.86 mg/dL (ref 0.44–1.00)
GFR, Estimated: >60 mL/min
Glucose, Bld: 95 mg/dL (ref 70–99)
Potassium: 4.5 mmol/L (ref 3.5–5.1)
Sodium: 139 mmol/L (ref 135–145)
Total Bilirubin: 0.4 mg/dL (ref 0.0–1.2)
Total Protein: 7.8 g/dL (ref 6.5–8.1)

## 2024-12-11 LAB — CBC WITH DIFFERENTIAL/PLATELET
Abs Immature Granulocytes: 0.02 K/uL (ref 0.00–0.07)
Basophils Absolute: 0.1 K/uL (ref 0.0–0.1)
Basophils Relative: 1 %
Eosinophils Absolute: 0.1 K/uL (ref 0.0–0.5)
Eosinophils Relative: 2 %
HCT: 42.1 % (ref 36.0–46.0)
Hemoglobin: 13.5 g/dL (ref 12.0–15.0)
Immature Granulocytes: 0 %
Lymphocytes Relative: 33 %
Lymphs Abs: 2.3 K/uL (ref 0.7–4.0)
MCH: 30.3 pg (ref 26.0–34.0)
MCHC: 32.1 g/dL (ref 30.0–36.0)
MCV: 94.4 fL (ref 80.0–100.0)
Monocytes Absolute: 0.7 K/uL (ref 0.1–1.0)
Monocytes Relative: 10 %
Neutro Abs: 3.8 K/uL (ref 1.7–7.7)
Neutrophils Relative %: 54 %
Platelets: 248 K/uL (ref 150–400)
RBC: 4.46 MIL/uL (ref 3.87–5.11)
RDW: 13.2 % (ref 11.5–15.5)
WBC: 7 K/uL (ref 4.0–10.5)
nRBC: 0 % (ref 0.0–0.2)

## 2024-12-12 ENCOUNTER — Other Ambulatory Visit

## 2024-12-12 ENCOUNTER — Telehealth: Payer: Self-pay | Admitting: Acute Care

## 2024-12-12 DIAGNOSIS — Z122 Encounter for screening for malignant neoplasm of respiratory organs: Secondary | ICD-10-CM

## 2024-12-12 DIAGNOSIS — R1032 Left lower quadrant pain: Secondary | ICD-10-CM

## 2024-12-12 DIAGNOSIS — Z87891 Personal history of nicotine dependence: Secondary | ICD-10-CM

## 2024-12-12 LAB — CEA: CEA: 3.3 ng/mL (ref 0.0–4.7)

## 2024-12-12 NOTE — Progress Notes (Signed)
 PELVIC US  TA/TV: atrophic homogeneous anteverted uterus,WNL,normal right ovary,limited view of left ovary because of bowel,no free fluid  Chaperone Alan

## 2024-12-12 NOTE — Telephone Encounter (Signed)
 Lung Cancer Screening Narrative/Criteria Questionnaire (Cigarette Smokers Only- No Cigars/Pipes/vapes)   Alexandra Bright   SDMV:12/14/24@0830a [place]                                           1965-11-23              LDCT: 01/01/25@0930a [name]    59 y.o.   Phone: [phone]  Lung Screening Narrative (confirm age 45-77 yrs Medicare / 50-80 yrs Private pay insurance)   Insurance information:Medicaid   Referring Provider:Patel   This screening involves an initial phone call with a team member from our program. It is called a shared decision making visit. The initial meeting is required by insurance and Medicare to make sure you understand the program. This appointment takes about 15-20 minutes to complete. The CT scan will completed at a separate date/time. This scan takes about 5-10 minutes to complete and you may eat and drink before and after the scan.  Criteria questions for Lung Cancer Screening:   Are you a current or former smoker? Former Age began smoking: age 66y   If you are a former smoker, what year did you quit smoking? 2022   To calculate your smoking history, I need an accurate estimate of how many packs of cigarettes you smoked per day and for how many years. (Not just the number of PPD you are now smoking)   Years smoking 40 x Packs per day 1 = Pack years 40   (at least 20 pack yrs)   (Make sure they understand that we need to know how much they have smoked in the past, not just the number of PPD they are smoking now)  Do you have a personal history of cancer?  Yes - (type and when diagnosed - 5 yrs cancer free) rectal 2022 (completed chemo/radiation/anterior resection)      Do you have a family history of cancer? Yes  (cancer type and and relative) mat aunt/lung and pat aunt breast  Are you coughing up blood?  No  Have you had unexplained weight loss of 15 lbs or more in the last 6 months? No  It looks like you meet all criteria.     Additional information: N/A

## 2024-12-14 ENCOUNTER — Encounter

## 2024-12-14 ENCOUNTER — Ambulatory Visit

## 2024-12-14 DIAGNOSIS — Z87891 Personal history of nicotine dependence: Secondary | ICD-10-CM

## 2024-12-14 DIAGNOSIS — Z122 Encounter for screening for malignant neoplasm of respiratory organs: Secondary | ICD-10-CM

## 2024-12-14 NOTE — Progress Notes (Signed)
 Virtual Visit via Telephone Note  I connected with Alexandra Bright on 12/14/24 at  8:30 AM EST by telephone and verified that I am speaking with the correct person using two identifiers.  Location: Patient: At home, in [place]  Provider: [address], [place], [place], [address]   I discussed the limitations, risks, security and privacy concerns of performing an evaluation and management service by telephone and the availability of in person appointments. I also discussed with the patient that there may be a patient responsible charge related to this service. The patient expressed understanding and agreed to proceed.  Shared Decision Making Visit Lung Cancer Screening Program [phone])   Eligibility: Age 59 y.o. Pack Years Smoking History Calculation 40 pack years  (# packs/per year x # years smoked) Recent History of coughing up blood  no Unexplained weight loss? no ( >Than 15 pounds within the last 6 months ) Prior History Lung / other cancer yes (Diagnosis within the last 5 years already requiring surveillance chest CT Scans).  Colorectal Cancer, Cancer free for 5 years.  Smoking Status Former Smoker Former Smokers: Years since quit: 4 years  Quit Date: 2022  Visit Components: Discussion included one or more decision making aids. yes Discussion included risk/benefits of screening. yes Discussion included potential follow up diagnostic testing for abnormal scans. yes Discussion included meaning and risk of over diagnosis. yes Discussion included meaning and risk of False Positives. yes Discussion included meaning of total radiation exposure. yes  Counseling Included: Importance of adherence to annual lung cancer LDCT screening. yes Impact of comorbidities on ability to participate in the program. yes Ability and willingness to under diagnostic treatment. yes  Smoking Cessation Counseling: Current Smokers:  Discussed importance of smoking cessation. N/A Information about  tobacco cessation classes and interventions provided to patient. N/A Patient provided with ticket for LDCT Scan. N/A Symptomatic Patient. N/A  Counseling Diagnosis Code: Tobacco Use Z72.0 Asymptomatic Patient N/A  Counseling  Former Smokers:  Discussed the importance of maintaining cigarette abstinence. yes Diagnosis Code: Personal History of Nicotine Dependence. S12.108 Information about tobacco cessation classes and interventions provided to patient. Yes Patient provided with ticket for LDCT Scan. N/A Written Order for Lung Cancer Screening with LDCT placed in Epic. Yes (CT Chest Lung Cancer Screening Low Dose W/O CM) [id number] Z12.2-Screening of respiratory organs Z87.891-Personal history of nicotine dependence   Wells CHRISTELLA [place], FNP

## 2024-12-14 NOTE — Patient Instructions (Signed)
 Thank you for participating in the [facility] Scre[facility]. It was our pleasure to meet you today. We will call you with the results of your scan within the next few days. Your scan will be assigned a Lung RADS category score by the physicians reading the scans.  This Lung RADS score determines follow up scanning.  See below for description of categories, and follow up screening recommendations. We will be in touch to schedule your follow up screening annually or based on recommendations of our providers. We will fax a copy of your scan results to your Primary Care Physician, or the physician who referred you to the program, to ensure they have the results. Please call the office if you have any questions or concerns regarding your scanning experience or results.  Our office number is [phone]. Please speak with Karna Curly, RN., Karna Doom RN, or [facility] RN, and Isaiah Dover RN. They are  our Lung Cancer Screening RN.'s If They are unavailable when you call, Please leave a message on the voice mail. We will return your call at our earliest convenience.This voice mail is monitored several times a day.  Remember, if your scan is normal, we will scan you annually as long as you continue to meet the criteria for the program. (Age 59-80, Current smoker or smoker who has quit within the last 15 years). If you are a smoker, remember, quitting is the single most powerful action that you can take to decrease your risk of lung cancer and other pulmonary, breathing related problems. We know quitting is hard, and we are here to help.  Please let us  know if there is anything we can do to help you meet your goal of quitting. If you are a former smoker, counselling psychologist. We are proud of you! Remain smoke free! Remember you can refer friends or family members through the number above.  We will screen them to make sure they meet criteria for the program. Thank you for helping us   take better care of you by participating in Lung Screening.  For Virtual Smoking Cessation Classes , The American Lung Association Provides  Freedom From Smoking Classes.  Please search their website for dates and times.    Lung RADS Categories:  Lung RADS 1: no nodules or definitely non-concerning nodules.  Recommendation is for a repeat annual scan in 12 months.  Lung RADS 2:  nodules that are non-concerning in appearance and behavior with a very low likelihood of becoming an active cancer. Recommendation is for a repeat annual scan in 12 months.  Lung RADS 3: nodules that are probably non-concerning , includes nodules with a low likelihood of becoming an active cancer.  Recommendation is for a 42-month repeat screening scan. Often noted after an upper respiratory illness. We will be in touch to make sure you have no questions, and to schedule your 10-month scan.  Lung RADS 4 A: nodules with concerning findings, recommendation is most often for a follow up scan in 3 months or additional testing based on our provider's assessment of the scan. We will be in touch to make sure you have no questions and to schedule the recommended 3 month follow up scan.  Lung RADS 4 B:  indicates findings that are concerning. We will be in touch with you to schedule additional diagnostic testing based on our provider's  assessment of the scan.  You can receive free nicotine replacement therapy ( patches, gum or mints) by calling 1-800-QUIT NOW.  Please call so we can get you on the path to becoming  a non-smoker. I know it is hard, but you can do this!  Other options for assistance in smoking cessation ( As covered by your insurance benefits)  Hypnosis for smoking cessation  [employer] [phone]  Acupuncture for smoking cessation  [facility] [phone]    Your CT scan is scheduled for 01/01/2025 at 9:30 am at [facility].

## 2024-12-18 ENCOUNTER — Inpatient Hospital Stay: Admitting: Oncology

## 2024-12-18 DIAGNOSIS — C189 Malignant neoplasm of colon, unspecified: Secondary | ICD-10-CM

## 2024-12-18 DIAGNOSIS — G6289 Other specified polyneuropathies: Secondary | ICD-10-CM

## 2024-12-18 NOTE — Progress Notes (Signed)
 "  [facility] OFFICE PROGRESS NOTE  Tobie Suzzane POUR, MD  ASSESSMENT & PLAN:  I connected with Lyniah O Fotheringham on 12/18/24 at 11:40 AM EST by telephone visit and verified that I am speaking with the correct person using two identifiers.   I discussed the limitations, risks, security and privacy concerns of performing an evaluation and management service by telemedicine and the availability of in-person appointments. I also discussed with the patient that there may be a patient responsible charge related to this service. The patient expressed understanding and agreed to proceed.   Other persons participating in the visit and their role in the encounter: NP, Patient    Patients location: Home   Providers location: Clinic     Assessment & Plan Adenocarcinoma of colon [facility]) - She denies any change in bowel habits.  Denies bleeding per rectum or melena. - Last colonoscopy was in September 2024, tubular adenomas removed. - Labs from 12/11/2024: Normal LFTs.  CBC was normal.  CEA was 3.3. - CTAP on 06/05/2024: No evidence of recurrence or metastatic disease.  Repeat CT scan in July 2026. - I discussed surveillance plan with follow-up visits every 6 months with labs and CEA.  Will do imaging once a year moving forward until year 5. Other polyneuropathy - Continue Lyrica  100 mg in the morning and 200 mg at bedtime. - Continue amitriptyline  25 mg at bedtime.  No burning pain reported in the hands since she was started on amitriptyline .    Orders Placed This Encounter  Procedures   CT ABDOMEN PELVIS W CONTRAST    Standing Status:   Future    Expected Date:   06/17/2025    Expiration Date:   12/18/2025    If indicated for the ordered procedure, I authorize the administration of contrast media per Radiology protocol:   Yes    Does the patient have a contrast media/X-ray dye allergy?:   No    Preferred imaging location?:   [facility]    If indicated for the ordered  procedure, I authorize the administration of oral contrast media per Radiology protocol:   Yes    INTERVAL HISTORY: Patient returns for follow-up for history of colon cancer.  She is on active surveillance.  Most recent CT scans from 06/05/2024 which did not reveal evidence of recurrent or metastatic disease in the abdomen or pelvis.  Reports some left lower quadrant discomfort over the past few months that is intermittent.  Patient did have ultrasound of her abdomen/pelvis ordered by GYN which was unremarkable.  Prior history of abnormal Pap smears.  Patient states she will have back injections tomorrow which will hopefully help with her low back pain.  Has LDCT screening in Feb 2026. Ordered by PCP.   We reviewed labs from 12/11/24.   SUMMARY OF HEMATOLOGIC HISTORY: Oncology History  Colorectal cancer (HCC)  04/08/2021 Initial Diagnosis   Rectal cancer (HCC)   04/20/2021 - 04/20/2021 Chemotherapy         08/18/2021 - 12/09/2021 Chemotherapy   Patient is on Treatment Plan : COLORECTAL FOLFOX q14d x 4 months      1.  Rectosigmoid sigmoid adenocarcinoma: - Presentation with the bleeding per rectum for the last 5 to 6 months and constipation. - Colonoscopy on 02/18/2021 showed ulcerated partially obstructing mass found from 12-17 cm from the anal verge, circumferential measuring 5 cm in length. - Biopsy consistent with adenocarcinoma. - CT CAP on 03/06/2021 with circumferential wall thickening, hyperenhancement and  fat stranding about the rectosigmoid junction, mass measuring approximately 6 cm in length.  No evidence of lymphadenopathy or metastatic disease. - CEA on 02/19/2021 was 1.8. - MRI of the pelvis on 04/06/2021 shows high rectal T3c/T4AN1 malignancy. - Chemoradiation therapy with Xeloda  from 04/22/2021 through 06/01/2021. - CT CAP on 06/29/2021 showed improvement in the rectosigmoid thickening at the site of malignancy.  No clear adenopathy.  No evidence of metastatic disease. - Robotic  assisted LAR on 07/10/2021 with pathology showing 2 cm grade 2, margins negative, 1/18 lymph nodes positive, no tumor deposits, positive treatment effect, no perineural invasion, no lymphovascular invasion, YPT3PN1A, MMR preserved - 8 cycles of FOLFOX completed on 12/07/2021. - Colonoscopy (08/16/2023): Tubular adenoma in the transverse and sigmoid colon.  Diverticulosis.   2.  Social/family history: - She worked as a haematologist and recently stopped working.  She quit smoking in October 2021, smoked 1 pack/day for 40 years. - Maternal aunt had lung cancer and was a non-smoker.  Paternal aunt had cancer, type not known to the patient.  CBC    Component Value Date/Time   WBC 7.0 12/11/2024 1053   RBC 4.46 12/11/2024 1053   HGB 13.5 12/11/2024 1053   HGB 13.0 11/27/2020 1138   HCT 42.1 12/11/2024 1053   HCT 39.2 11/27/2020 1138   PLT 248 12/11/2024 1053   PLT 345 11/27/2020 1138   MCV 94.4 12/11/2024 1053   MCV 92 11/27/2020 1138   MCH 30.3 12/11/2024 1053   MCHC 32.1 12/11/2024 1053   RDW 13.2 12/11/2024 1053   RDW 12.0 11/27/2020 1138   LYMPHSABS 2.3 12/11/2024 1053   LYMPHSABS 3.2 (H) 11/27/2020 1138   MONOABS 0.7 12/11/2024 1053   EOSABS 0.1 12/11/2024 1053   EOSABS 0.2 11/27/2020 1138   BASOSABS 0.1 12/11/2024 1053   BASOSABS 0.1 11/27/2020 1138       Latest Ref Rng & Units 12/11/2024   10:53 AM 06/05/2024    3:07 PM 03/28/2024    4:02 PM  CMP  Glucose 70 - 99 mg/dL 95  890  893   BUN 6 - 20 mg/dL 16  17  18    Creatinine 0.44 - 1.00 mg/dL 9.13  9.09  9.20   Sodium 135 - 145 mmol/L 139  138  136   Potassium 3.5 - 5.1 mmol/L 4.5  3.9  3.9   Chloride 98 - 111 mmol/L 101  102  106   CO2 22 - 32 mmol/L 23  23  22    Calcium  8.9 - 10.3 mg/dL 89.9  9.3  9.7   Total Protein 6.5 - 8.1 g/dL 7.8  7.4  7.6   Total Bilirubin 0.0 - 1.2 mg/dL 0.4  0.5  0.2   Alkaline Phos 38 - 126 U/L 93  75  81   AST 15 - 41 U/L 25  25  29    ALT 0 - 44 U/L 21  23  25       Lab Results  Component  Value Date   VITAMINB12 1,041 (H) 04/06/2022    There were no vitals filed for this visit.  Review of System:  Review of Systems  Constitutional:  Positive for malaise/fatigue.  Gastrointestinal:  Positive for abdominal pain.  Musculoskeletal:  Positive for back pain.    Physical Exam: Physical Exam Neurological:     Mental Status: She is alert and oriented to person, place, and time.     I provided 20 minutes of non face-to-face telephone visit time during  this encounter, and > 50% was spent counseling as documented under my assessment & plan.   Delon Hope, NP 12/18/2024 11:45 AM "

## 2024-12-18 NOTE — Assessment & Plan Note (Addendum)
-   She denies any change in bowel habits.  Denies bleeding per rectum or melena. - Last colonoscopy was in September 2024, tubular adenomas removed. - Labs from 12/11/2024: Normal LFTs.  CBC was normal.  CEA was 3.3. - CTAP on 06/05/2024: No evidence of recurrence or metastatic disease.  Repeat CT scan in July 2026. - I discussed surveillance plan with follow-up visits every 6 months with labs and CEA.  Will do imaging once a year moving forward until year 5.

## 2024-12-18 NOTE — Assessment & Plan Note (Addendum)
-   Continue Lyrica  100 mg in the morning and 200 mg at bedtime. - Continue amitriptyline  25 mg at bedtime.  No burning pain reported in the hands since she was started on amitriptyline .

## 2024-12-19 ENCOUNTER — Other Ambulatory Visit: Payer: Self-pay

## 2024-12-19 ENCOUNTER — Ambulatory Visit: Admitting: Physical Medicine and Rehabilitation

## 2024-12-19 ENCOUNTER — Ambulatory Visit: Admitting: Orthopedic Surgery

## 2024-12-19 VITALS — BP 146/90

## 2024-12-19 DIAGNOSIS — M47816 Spondylosis without myelopathy or radiculopathy, lumbar region: Secondary | ICD-10-CM | POA: Diagnosis not present

## 2024-12-19 MED ORDER — BUPIVACAINE HCL 0.5 % IJ SOLN
3.0000 mL | Freq: Once | INTRAMUSCULAR | Status: AC
  Administered 2024-12-19: 3 mL

## 2024-12-19 NOTE — Progress Notes (Signed)
 Pain Scale   Average Pain 7 Patient advising has chronic lower back pain that radiates to right leg, pain increases when walking and standing        +Driver, -BT, -Dye Allergies.

## 2024-12-20 ENCOUNTER — Telehealth: Payer: Self-pay

## 2024-12-20 DIAGNOSIS — M47816 Spondylosis without myelopathy or radiculopathy, lumbar region: Secondary | ICD-10-CM

## 2024-12-20 NOTE — Addendum Note (Signed)
 Addended by: ELDONNA GARDINER POUR on: 12/20/2024 09:30 AM   Modules accepted: Orders

## 2024-12-20 NOTE — Telephone Encounter (Signed)
 Pain diary# 1 ,verbal---total--100% pain free thru the 4 hours and still no pain. She is unable to upload pain diary.

## 2024-12-21 ENCOUNTER — Telehealth: Payer: Self-pay

## 2024-12-21 NOTE — Telephone Encounter (Signed)
 Pt has been informed labs are the same ones Tobie had ordered no need to repeat same labs at this time.

## 2024-12-21 NOTE — Telephone Encounter (Signed)
 Copied from CRM [id number]. Topic: Clinical - Request for Lab/Test Order >> Dec 21, 2024  8:12 AM Alexandra Bright wrote: Reason for CRM: Patient states she had labs completed with her cancer doctor on the 01/20 and she wants to make sure the order for PCP is not the same. Please contact patient to advise if she needs to come in for labs.

## 2024-12-24 ENCOUNTER — Ambulatory Visit: Admitting: Internal Medicine

## 2024-12-24 NOTE — Procedures (Signed)
 Lumbar Diagnostic Facet Joint Nerve Block with Fluoroscopic Guidance   Patient: Alexandra Bright      Date of Birth: 1966/03/09 MRN: [id number] PCP: Tobie Suzzane POUR, MD      Visit Date: 12/19/2024   Universal Protocol:    Date/Time: 02/02/265:55 PM  Consent Given By: the patient  Position: PRONE  Additional Comments: Vital signs were monitored before and after the procedure. Patient was prepped and draped in the usual sterile fashion. The correct patient, procedure, and site was verified.   Injection Procedure Details:   Procedure diagnoses:  1. Spondylosis without myelopathy or radiculopathy, lumbar region      Meds Administered:  Meds ordered this encounter  Medications   bupivacaine  (MARCAINE ) 0.5 % (with pres) injection 3 mL     Laterality: Bilateral  Location/Site: L4-L5, L3 and L4 medial branches and L5-S1, L4 medial branch and L5 dorsal ramus  Needle: 5.0 in., 25 ga.  Short bevel or Quincke spinal needle  Needle Placement: Oblique pedical  Findings:   -Comments: There was excellent flow of contrast along the articular pillars without intravascular flow.  Procedure Details: The fluoroscope beam is vertically oriented in AP and then obliqued 15 to 20 degrees to the ipsilateral side of the desired nerve to achieve the Scotty dog appearance.  The skin over the target area of the junction of the superior articulating process and the transverse process (sacral ala if blocking the L5 dorsal rami) was locally anesthetized with a 1 ml volume of 1% Lidocaine  without Epinephrine .  The spinal needle was inserted and advanced in a trajectory view down to the target.   After contact with periosteum and negative aspirate for blood and CSF, correct placement without intravascular or epidural spread was confirmed by injecting 0.5 ml. of Isovue -250.  A spot radiograph was obtained of this image.    Next, a 0.5 ml. volume of the injectate described above was injected. The needle  was then redirected to the other facet joint nerves mentioned above if needed.  Prior to the procedure, the patient was given a Pain Diary which was completed for baseline measurements.  After the procedure, the patient rated their pain every 30 minutes and will continue rating at this frequency for a total of 5 hours.  The patient has been asked to complete the Diary and return to us  by mail, fax or hand delivered as soon as possible.   Additional Comments:  The patient tolerated the procedure well Dressing: 2 x 2 sterile gauze and Band-Aid    Post-procedure details: Patient was observed during the procedure. Post-procedure instructions were reviewed.  Patient left the clinic in stable condition.

## 2025-01-01 ENCOUNTER — Ambulatory Visit (HOSPITAL_COMMUNITY)

## 2025-01-02 ENCOUNTER — Ambulatory Visit: Admitting: Orthopedic Surgery

## 2025-01-16 ENCOUNTER — Ambulatory Visit: Admitting: Dermatology

## 2025-02-14 ENCOUNTER — Ambulatory Visit: Admitting: Internal Medicine

## 2025-06-10 ENCOUNTER — Inpatient Hospital Stay

## 2025-06-10 ENCOUNTER — Other Ambulatory Visit (HOSPITAL_COMMUNITY)

## 2025-06-17 ENCOUNTER — Inpatient Hospital Stay

## 2025-06-17 ENCOUNTER — Other Ambulatory Visit (HOSPITAL_COMMUNITY)

## 2025-06-18 ENCOUNTER — Inpatient Hospital Stay: Admitting: Oncology

## 2025-06-21 ENCOUNTER — Inpatient Hospital Stay: Admitting: Oncology
